# Patient Record
Sex: Female | Born: 1975 | Race: Black or African American | Hispanic: No | Marital: Married | Smoking: Never smoker
Health system: Southern US, Community
[De-identification: ages and names within clinical notes are randomized; demographics above are authoritative.]

## PROBLEM LIST (undated history)

## (undated) DIAGNOSIS — R87619 Unspecified abnormal cytological findings in specimens from cervix uteri: Secondary | ICD-10-CM

## (undated) DIAGNOSIS — N870 Mild cervical dysplasia: Secondary | ICD-10-CM

## (undated) DIAGNOSIS — T7840XA Allergy, unspecified, initial encounter: Secondary | ICD-10-CM

## (undated) DIAGNOSIS — IMO0002 Reserved for concepts with insufficient information to code with codable children: Secondary | ICD-10-CM

## (undated) DIAGNOSIS — M069 Rheumatoid arthritis, unspecified: Secondary | ICD-10-CM

## (undated) DIAGNOSIS — E039 Hypothyroidism, unspecified: Secondary | ICD-10-CM

## (undated) DIAGNOSIS — Z8701 Personal history of pneumonia (recurrent): Secondary | ICD-10-CM

## (undated) HISTORY — DX: Unspecified abnormal cytological findings in specimens from cervix uteri: R87.619

## (undated) HISTORY — DX: Allergy, unspecified, initial encounter: T78.40XA

## (undated) HISTORY — DX: Reserved for concepts with insufficient information to code with codable children: IMO0002

## (undated) HISTORY — PX: LEEP: SHX91

## (undated) HISTORY — DX: Mild cervical dysplasia: N87.0

## (undated) HISTORY — DX: Personal history of pneumonia (recurrent): Z87.01

## (undated) HISTORY — PX: TUBAL LIGATION: SHX77

## (undated) HISTORY — DX: Rheumatoid arthritis, unspecified: M06.9

---

## 1997-07-15 ENCOUNTER — Other Ambulatory Visit: Admission: RE | Admit: 1997-07-15 | Discharge: 1997-07-15 | Payer: Self-pay | Admitting: Family Medicine

## 2001-10-17 ENCOUNTER — Other Ambulatory Visit: Admission: RE | Admit: 2001-10-17 | Discharge: 2001-10-17 | Payer: Self-pay | Admitting: Obstetrics and Gynecology

## 2001-12-18 ENCOUNTER — Ambulatory Visit (HOSPITAL_COMMUNITY): Admission: RE | Admit: 2001-12-18 | Discharge: 2001-12-18 | Payer: Self-pay | Admitting: Obstetrics and Gynecology

## 2001-12-18 ENCOUNTER — Encounter (INDEPENDENT_AMBULATORY_CARE_PROVIDER_SITE_OTHER): Payer: Self-pay | Admitting: Specialist

## 2002-05-24 ENCOUNTER — Other Ambulatory Visit: Admission: RE | Admit: 2002-05-24 | Discharge: 2002-05-24 | Payer: Self-pay | Admitting: Obstetrics and Gynecology

## 2002-10-04 ENCOUNTER — Other Ambulatory Visit: Admission: RE | Admit: 2002-10-04 | Discharge: 2002-10-04 | Payer: Self-pay | Admitting: Obstetrics and Gynecology

## 2003-02-27 ENCOUNTER — Other Ambulatory Visit: Admission: RE | Admit: 2003-02-27 | Discharge: 2003-02-27 | Payer: Self-pay | Admitting: Obstetrics and Gynecology

## 2004-03-18 ENCOUNTER — Other Ambulatory Visit: Admission: RE | Admit: 2004-03-18 | Discharge: 2004-03-18 | Payer: Self-pay | Admitting: Obstetrics and Gynecology

## 2004-12-01 ENCOUNTER — Encounter: Admission: RE | Admit: 2004-12-01 | Discharge: 2004-12-01 | Payer: Self-pay | Admitting: Family Medicine

## 2005-04-19 ENCOUNTER — Other Ambulatory Visit: Admission: RE | Admit: 2005-04-19 | Discharge: 2005-04-19 | Payer: Self-pay | Admitting: Obstetrics and Gynecology

## 2006-05-02 ENCOUNTER — Encounter: Admission: RE | Admit: 2006-05-02 | Discharge: 2006-05-02 | Payer: Self-pay | Admitting: Obstetrics and Gynecology

## 2007-06-24 ENCOUNTER — Inpatient Hospital Stay (HOSPITAL_COMMUNITY): Admission: AD | Admit: 2007-06-24 | Discharge: 2007-06-24 | Payer: Self-pay | Admitting: Obstetrics and Gynecology

## 2007-07-04 ENCOUNTER — Inpatient Hospital Stay (HOSPITAL_COMMUNITY): Admission: RE | Admit: 2007-07-04 | Discharge: 2007-07-07 | Payer: Self-pay | Admitting: Obstetrics and Gynecology

## 2007-07-04 ENCOUNTER — Encounter (INDEPENDENT_AMBULATORY_CARE_PROVIDER_SITE_OTHER): Payer: Self-pay | Admitting: Obstetrics and Gynecology

## 2010-07-28 NOTE — H&P (Signed)
[name], [name]             ACCOUNT NO.:  [id number]   MEDICAL RECORD NO.:  [id number]          PATIENT TYPE:  INP   LOCATION:  9165                          FACILITY:  [facility]   PHYSICIAN:  Osborn Coho, M.D.   DATE OF BIRTH:  1975/12/17   DATE OF ADMISSION:  07/04/2007  DATE OF DISCHARGE:                              HISTORY & PHYSICAL   [name] is a 35 year old gravida 2, para 1-0-0-1, at 39-1/7 weeks,  who presents today for induction secondary to mildly elevated liver  function tests and LGA.  The patient's history is been remarkable for:   1. A LEEP procedure in 2005.  2. Negative group B strep.  3. Mildly elevated liver function tests beginning approximately June 21, 2007 with SGOT 46 at that time.  Over the next 2 weeks SGOT has      remained approximately between 38 and 45 with most recent      examination on April 20 of an SGOT of 38.  4. Possible LGA on last ultrasound.   PRENATAL LABS:  Blood type is A+, Rh antibody negative.  VDRL  nonreactive.  Rubella titer positive.  Hepatitis B surface antigen  negative.  HIV nonreactive.  Sickle cell test was negative.  Cystic  fibrosis testing was declined.  Varicella was immune.  GC and chlamydia  cultures were done in September and were negative.  Pap was done in  September 2008 and was normal.  The patient declined first trimester  screening and quadruple screening.  She had a normal Glucola.  Hemoglobin upon entering the practice was 11.3.  It was 10.1 at 27  weeks.  She had a normal Glucola and an RPR that was nonreactive at 27  weeks.  GC, chlamydia, group B strep were done at 35 weeks and all were  negative.  On April 8 the patient had a mild elevation of her blood  pressure of 130/88.  Comprehensive metabolic panel was done at that  time.  Uric acid, LDH were all normal.  However, the SGOT was 46.  She  had a 24-hour urine done subsequent to that that showed normal values  with 148 mg protein in a 24-hour  specimen.  Over the next week and  half  the patient's blood pressures and SGOT, SGPT and other labs were  monitored.  Her SGOT was noted to peak at previously-noted 46 on April  8.  On most recent examination on April 20 SGOT was 38, creatinine was  0.56, BUN was 8 and uric acid was 5.9.  LDH was 145.   HISTORY OF PRESENT PREGNANCY:  The patient entered care at approximately  10 weeks.  She was undecided regarding first trimester screen.  Ultimately she elected to decline that as well as quadruple screen.  Cervical length was done at 14 weeks secondary to history of LEEP  procedure.  Labs were all normal.  The patient a Pap done in September  in 58-month follow-up from a colposcopy.  She had another ultrasound at  19 weeks showing normal growth and normal cervical length.  There were  some limitations of anatomy.  These were evaluated at the following  visit.  She had a normal Glucola.  She did express some interest in  tubal; however, no definitive plans were made.  Hemoglobin at 27 weeks  was 10.1.  Iron samples were given.  She had some swelling in her legs  at 33 weeks.  TED hose were recommended.  Cultures and group B strep  were done at 35 weeks, which were all negative.  She did continue to  desire sterilization at a follow-up visit.  This was discussed.  At 36  weeks she had an estimated fetal weight of 6 pounds 12 ounces at the  76th percentile.  At 37 weeks she complained of some bumps.  There was  no history of HSV.  Blood pressure at that time was 130/88 and 140/88.  Comprehensive metabolic panel was done with [facility] labs.  That is when the  SGOT was noted to be slightly elevated at 46.  All other values were  normal.  A 24-hour urine was done that showed 148 mg of protein in a 24-  hour specimen.  LFTs were followed over the next week and half.  They  were stable and even little bit less; however, SGOT remained at 38 on  evaluation on April 20.  Other labs were normal.  The  decision was made  to go ahead and proceed with induction at this time in light of also  possible LGA per Dr. Su Hilt' assessment.   OBSTETRICAL HISTORY:  In 1998 she had a vaginal birth of a female  infant,weight 7 pounds 8 ounces,at 40 weeks.  She was in labor 8 hours.  She had epidural anesthesia.   MEDICAL HISTORY:  She was on Ortho-Cept and stopped in February 2008.  In April 2008 she had a colposcopy for an abnormal Pap.  This was  normal, and she had 70-month follow-up after that.  She had a LEEP  procedure in 2005.  She did receive the varicella vaccine because she  had never had a history of chickenpox; however, she had varicella titers  that were immune during this pregnancy.  The patient was hospitalized  for pneumonia in 1998.   She has no known medication allergies; however, she is sensitive to all  fruit, ragweed, dust and cats.   FAMILY HISTORY:  Her mother is hypertensive on medication.  Her sister  has some type of thyroid problem.  Her sister also has seizures.  Her  father has depression and was on medication.  Her sister also had  preeclampsia.   GENETIC HISTORY:  Unremarkable.   SOCIAL HISTORY:  The patient is married to the father of baby.  He is  involved and supportive.  His name is Tonnia Bardin.  The patient is  Tree surgeon.  The patient has a bachelor's degree.  She is a  Nurse, children's.  Her husband has a Advertising copywriter.  He is a Paediatric nurse.  The  patient denies any religious affiliation.  She has been followed by the  physician service at [facility].  She denies any alcohol, drug  or tobacco use during this pregnancy.   PHYSICAL EXAM:  VITAL SIGNS:  On last documented on our chart, within  normal limits.  HEENT:  Within normal limits.  LUNGS:  Bilateral breath sounds are clear.  HEART:  Regular rate and rhythm without murmur.  BREASTS:  Soft and nontender.  ABDOMEN:  Fundal height is approximately 39  cm.  Estimated fetal weight  was not noted on  the patient's prenatal record per Dr. Su Hilt'  evaluation.  PELVIC EXAM:  Also deferred today.  EXTREMITIES:  Deep tendon reflexes are 2+ without clonus.  There is 1+  edema noted.  Fetal heart rate has been in the 140s by Doppler in the office.   ASSESSMENT:  1. Intrauterine pregnancy at 39-1/7 weeks.  2. Mild elevation of SGOT during the last 2 weeks.  3. History of abnormal Papanicolaou smear in 2008.  4. Desires sterilization.  5. Possible large for gestational age.   PLAN:  1. Admit to birthing suite per consult with Dr. Osborn Coho as      attending physician.  2. Routine physician orders.  3. Dr. Su Hilt plans low-dose Pitocin with pain medication p.r.n.      Renaldo Reel Emilee Hero, C.N.M.      Osborn Coho, M.D.  Electronically Signed    VLL/MEDQ  D:  07/04/2007  T:  07/04/2007  Job:  [id number]

## 2010-07-28 NOTE — Discharge Summary (Signed)
[name], French             ACCOUNT NO.:  [id number]   MEDICAL RECORD NO.:  [id number]          PATIENT TYPE:  INP   LOCATION:  9104                          [facility]ILITY:  [facility]   PHYSICIAN:  Osborn Coho, M.D.   DATE OF BIRTH:  1975-09-30   DATE OF ADMISSION:  07/04/2007  DATE OF DISCHARGE:  07/07/2007                               DISCHARGE SUMMARY   DISCHARGING PHYSICIAN:  Naima A. Dillard, MD   ADMISSION DIAGNOSES:  1. Intrauterine pregnancy at 39-1/7th weeks.  2. Elevated AST.  3. Questionable large for gestational age.  4. Group B strep negative.   DISCHARGE DIAGNOSES:  1. Intrauterine pregnancy at 39-1/7th weeks.  2. Elevated AST.  3. Questionable large for gestational age.  4. Group B strep negative.  5. Failure to descend and status post cesarean delivery of a female      infant weighing 7 pounds 14 ounces, Apgars 9 and 9.   HOSPITAL PROCEDURES:  1. Electronic fetal monitoring.  2. Pitocin induction of labor.  3. Epidural.  4. Primary low-transverse cesarean section.   HOSPITAL COURSE:  The patient was admitted for induction of labor  secondary to elevation of AST and questionable LGA.  Initial AST on  April 9 was 46 and on admission was 41.  A 24-hour urine protein showed  148 mg of protein.  She was placed on low-dose Pitocin with the cervix  initially 2-3 cm.  She progressed to complete dilation by 4:30 p.m. and  pushed for almost 2 hours with no descent of the vertex and decision was  made to proceed with primary low-transverse cesarean section performed  by Dr. Su Hilt under epidural anesthesia.  Estimated blood loss was 1500  mL.  There were no complications.  She was taken to recovery and mother  baby unit where she received routine care.  On postop day #1, she was  doing well.  Hemoglobin was 7.3.  She had no syncope and her orthostatic  vital signs were within normal limits.  AST was 41, ALT was 19, uric  acid 5.5.  She was placed on iron supplements.   On postop day #2, she  was doing well, breast-feeding well, tolerating food, IV was out,  catheter was out, and she received routine care.  On postop day #3, the  repeat labs showed a hemoglobin 6.2, but the patient had no hemodynamic  changes with that and continued to decline transfusion.   PHYSICAL EXAMINATION:  VITAL SIGNS:  Stable.  CHEST:  Clear.  HEART:  Regular rate and rhythm.  ABDOMEN:  Soft and appropriately tender.  Incision was clean, dry, and  intact.  EXTREMITIES:  Showed trace edema.   She was deemed to receive full benefit of her hospital stay and was  discharged home.   DISCHARGE LABORATORY DATA:  White blood cell count 9.6, hemoglobin 6.2,  platelets 149.  Potassium 3.7, creatinine 0.62.  AST 47, ALT 21.   DISCHARGE MEDICATIONS:  1. Motrin 600 mg p.o. q.6 h. P.r.n.  2. Tylox 1-2 p.o. q.4 h. P.r.n.  3. Hemocyte-F 1 p.o. daily   DISCHARGE INSTRUCTIONS:  Per [employer].   DISCHARGE FOLLOWUP:  In 6 weeks or p.r.n.   CONDITION ON DISCHARGE:  Good.      Marie L. Williams, C.N.M.      Osborn Coho, M.D.  Electronically Signed    MLW/MEDQ  D:  07/07/2007  T:  07/08/2007  Job:  [id number]

## 2010-07-28 NOTE — Op Note (Signed)
[name], [name]             ACCOUNT NO.:  [id number]   MEDICAL RECORD NO.:  [id number]          PATIENT TYPE:  INP   LOCATION:  9104                          [facility]ACILITY[facility]  [facility]   PHYSICIAN:  Osborn Coho, M.D.   DATE OF BIRTH:  08/12/1975   DATE OF PROCEDURE:  07/04/2007  DATE OF DISCHARGE:                               OPERATIVE REPORT   PREOPERATIVE DIAGNOSES:  1. Term intrauterine pregnancy.  2. Favorable cervix with augmentation.  3. Elevated liver function tests.  4. Failure to descend.   POSTOPERATIVE DIAGNOSES:  1. Term intrauterine pregnancy.  2. Favorable cervix with augmentation.  3. Elevated liver function tests.  4. Failure to descend.   PROCEDURE:  Primary low transverse C-section.   ANESTHESIA:  Epidural.   ATTENDING:  Dr. Osborn Coho.   ASSISTANT:  Cam Hai, C.N.M.   FLUIDS:  4000 mL.   ESTIMATED BLOOD LOSS:  1500 mL.   URINE OUTPUT:  300 mL.   SPECIMENS TO PATHOLOGY:  Placenta.   COMPLICATIONS:  None.   FINDINGS:  Live female infant with Apgars of 9 at one minute and 9 at five  minutes [name].  Weight 7 pounds 14 ounces.  Normal-appearing  bilateral ovaries and fallopian tubes.   DESCRIPTION OF PROCEDURE:  The patient was taken to the operating room  after the risks, benefits, alternatives discussed with the patient.  The  patient verbalized understanding and consent signed and witnessed.  The  patient was given a surgical level via the epidural and prepped and  draped in the normal sterile fashion in the supine position.  A  Pfannenstiel skin incision was made and carried down to the underlying  layer of fascia with the scalpel and Bovie.  The fascia was excised  bilaterally in the midline with the Bovie and extended bilaterally with  Mayo scissors.  Kocher clamps were placed on the inferior aspect of the  fascial incision and the rectus muscle excised from the fascia.  The  same was done on the superior aspect of the fascial  incision.  The  muscle was separated in midline and the peritoneum entered bluntly and  extended manually.  The bladder blade was placed and bladder flap  created with the Metzenbaum scissors.  The uterine incision was made  with a scalpel and extended bilaterally with the bandage scissors.  The  infant was delivered without difficulty and clear fluid was noted.  The  oropharynx was bulb suctioned and the cord clamped and cut, and the  infant handed to awaiting pediatricians.  The placenta was removed via  fundal massage and was sent to pathology.  The uterus was cleared of all  clots and debris.  The uterine incision was repaired with 0 Vicryl in a  running locked fashion and a second imbricating layer was performed.  Several figure-of-eight stitches were placed for hemostasis as well.  The intra-abdominal cavity was copiously irrigated and normal-appearing  bilateral ovaries and fallopian tubes were noted.  The peritoneum was  repaired with 2-0 chromic in a running fashion and the fascia was  repaired with -0 Vicryl in a  running fashion.  The subcutaneous tissue  was irrigated and made hemostatic with the Bovie.  The subcutaneous  tissue was reapproximated using 2-0 plain via four interrupted stitches.  The skin was reapproximated using 3-0 Monocryl via a subcuticular  stitch.  Half-inch Steri-Strips were applied with Benzoin.  Sponge, lap  and needle count was correct.  The patient tolerated the procedure well  and is awaiting transfer to the recovery room in good condition.      Osborn Coho, M.D.  Electronically Signed     AR/MEDQ  D:  07/04/2007  T:  07/04/2007  Job:  [id number]

## 2010-07-31 NOTE — H&P (Signed)
   NAME:  [name], [name]                         ACCOUNT NO.:  [id number]   MEDICAL RECORD NO.:  [id number]                   PATIENT TYPE:  AMB   LOCATION:  SDC                                  [facility]ILITY:  [facility]   PHYSICIAN:  Osborn Coho, M.D.                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   PAST OBSTETRICAL HISTORY:  Normal spontaneous vaginal delivery x1.  History  of regular menses.  The patient denies history of any STDs, gonorrhea,  Chlamydia, pelvic inflammatory disease, fibroids, or cysts.   PAST MEDICAL HISTORY:  Denies.   PAST SURGICAL HISTORY:  Denies.   MEDICATIONS:  Estrostep (OCP).   ALLERGIES:  No known drug allergies.  (Fruits and cats.)   SOCIAL HISTORY:  Denies smoking, alcohol, or illicit drug use.   FAMILY HISTORY:  History of asthma, cancer, hypertension, seizures, and  mental/emotional disorders.   PHYSICAL EXAMINATION:  VITAL SIGNS:  Stable.  GENERAL:  The patient is a well-appearing, healthy female.  LUNGS:  Clear to auscultation bilaterally.  HEART:  Regular rate and rhythm.  ABDOMEN:  Soft and nontender.  PELVIC:  As per office notes.  EXTREMITIES:  Within normal limits.   ASSESSMENT:  The patient is a 35 year old gravida 1, para 1, with high grade  SIL, HGSIL/CIN2.   PLAN:  LEEP.                                               Osborn Coho, M.D.    AR/MEDQ  D:  12/18/2001  T:  12/18/2001  Job:  [id number]

## 2010-07-31 NOTE — Op Note (Signed)
   NAME:  [name], [name]                         ACCOUNT NO.:  [id number]   MEDICAL RECORD NO.:  [id number]                   PATIENT TYPE:  AMB   LOCATION:  SDC                                  [facility]   PHYSICIAN:  Osborn Coho, M.D.                DATE OF BIRTH:  09/04/1975   DATE OF PROCEDURE:  12/18/2001  DATE OF DISCHARGE:                                 OPERATIVE REPORT   PREOPERATIVE DIAGNOSES:  CIN II.   POSTOPERATIVE DIAGNOSES:  CIN II.   PROCEDURE:  Loop electrosurgical excision procedure.   SURGEON:  Osborn Coho, M.D.   ANESTHESIA:  MAC and paracervical block using 10 cc of 2% lidocaine.   ESTIMATED BLOOD LOSS:  Minimal, less than 10 cc.   FLUIDS:  700 cc.   COMPLICATIONS:  None.   PROCEDURE:  The patient was taken to the operating room after the risks,  benefits, and alternatives of the procedure were discussed with patient.  The patient verbalized understanding and consent signed and witnessed.  The  patient was placed under MAC anesthesia and placed in a dorsal lithotomy  position and prepped and draped in a normal sterile fashion.  A bivalve  speculum was placed in the patient's vagina and a paracervical block  delivered using 5 cc of 2% lidocaine at 3 o'clock and 9 o'clock on the  cervix, respectively.  A LEEP electrocautery device was then used to perform  the LEEP and specimen tagged and sent off to pathology.  A ball attachment  was then used for coagulation and Monsel solution applied with good  hemostasis.  The speculum was removed.  The patient tolerated procedure well  and was returned to the recovery room in stable condition.                                               Osborn Coho, M.D.    AR/MEDQ  D:  12/18/2001  T:  12/18/2001  Job:  [id number]

## 2010-12-08 LAB — URINE MICROSCOPIC-ADD ON

## 2010-12-08 LAB — COMPREHENSIVE METABOLIC PANEL
ALT: 21
AST: 44 — ABNORMAL HIGH
AST: 47 — ABNORMAL HIGH
Albumin: 2.6 — ABNORMAL LOW
Albumin: 1.7 — ABNORMAL LOW
Alkaline Phosphatase: 107
Alkaline Phosphatase: 92
BUN: 12
CO2: 25
CO2: 25
Calcium: 9.4
Calcium: 8.1 — ABNORMAL LOW
Chloride: 106
Chloride: 107
Chloride: 110
Creatinine, Ser: 0.46
Creatinine, Ser: 0.62
Creatinine, Ser: 0.74
GFR calc Af Amer: >60
GFR calc Af Amer: >60
GFR calc non Af Amer: >60
GFR calc non Af Amer: >60
Glucose, Bld: 123 — ABNORMAL HIGH
Potassium: 3.7
Potassium: 3.9
Sodium: 138
Total Bilirubin: 0.8
Total Bilirubin: 0.3
Total Protein: 6.1
Total Protein: 4 — ABNORMAL LOW

## 2010-12-08 LAB — CBC
HCT: 33.8 — ABNORMAL LOW
Hemoglobin: 11.6 — ABNORMAL LOW
Hemoglobin: 11.9 — ABNORMAL LOW
Hemoglobin: 6.2 — CL
MCHC: 34.1
MCV: 88.9
MCV: 89.8
Platelets: 149 — ABNORMAL LOW
RBC: 3.8 — ABNORMAL LOW
RBC: 2.02 — ABNORMAL LOW
RBC: 2.42 — ABNORMAL LOW
RDW: 15.3
RDW: 15.4
WBC: 7.6
WBC: 9.6

## 2010-12-08 LAB — URINALYSIS, ROUTINE W REFLEX MICROSCOPIC
Hgb urine dipstick: NEGATIVE
Protein, ur: NEGATIVE
Specific Gravity, Urine: <1.005 — ABNORMAL LOW
Urobilinogen, UA: 0.2
pH: 7

## 2010-12-08 LAB — DIFFERENTIAL
Basophils Absolute: 0
Eosinophils Relative: 0
Lymphocytes Relative: 17
Lymphs Abs: 2
Monocytes Absolute: 0.7
Neutro Abs: 8.8 — ABNORMAL HIGH
Neutrophils Relative %: 76

## 2010-12-08 LAB — URIC ACID: Uric Acid, Serum: 5.5

## 2010-12-22 ENCOUNTER — Other Ambulatory Visit: Payer: Self-pay | Admitting: Obstetrics and Gynecology

## 2011-01-01 ENCOUNTER — Other Ambulatory Visit: Payer: Self-pay | Admitting: Obstetrics and Gynecology

## 2011-01-06 ENCOUNTER — Other Ambulatory Visit: Payer: Self-pay | Admitting: Obstetrics and Gynecology

## 2011-01-20 ENCOUNTER — Encounter (HOSPITAL_COMMUNITY): Payer: Self-pay

## 2011-01-21 NOTE — Patient Instructions (Addendum)
   Your procedure is scheduled [id number] November 19th  Enter through the Main Entrance of [facility] at:8am Pick up the phone at the desk and dial [phone] and inform us of your arrival.  Please call this number if you have any problems the morning of surgery: [phone]  Remember: Do not eat food after midnight:Sunday Do not drink clear liquids after:midnight Sunday Take these medicines the morning of surgery with a SIP OF WATER: thyroid med  Do not wear jewelry, make-up, or FINGER nail polish Do not wear lotions, powders, or perfumes.  You may not  wear deodorant. Do not shave 48 hours prior to surgery. Do not bring valuables to the hospital.    Patients discharged on the day of surgery will not be allowed to drive home.   Name and phone number of your driver:   Remember to use your hibiclens as instructed.Please shower with 1/2 bottle the evening before your surgery and the other 1/2 bottle the morning of surgery.

## 2011-01-25 ENCOUNTER — Encounter (HOSPITAL_COMMUNITY)
Admission: RE | Admit: 2011-01-25 | Discharge: 2011-01-25 | Disposition: A | Discharge status: Home / Self Care | Payer: 59 | Source: Ambulatory Visit | Attending: Obstetrics and Gynecology | Admitting: Obstetrics and Gynecology

## 2011-01-25 ENCOUNTER — Encounter (HOSPITAL_COMMUNITY): Payer: Self-pay

## 2011-01-25 HISTORY — DX: Hypothyroidism, unspecified: E03.9

## 2011-01-25 LAB — CBC
HCT: 35.4 % — ABNORMAL LOW (ref 36.0–46.0)
Hemoglobin: 11.3 g/dL — ABNORMAL LOW (ref 12.0–15.0)
MCH: 27.2 pg (ref 26.0–34.0)
MCHC: 31.9 g/dL (ref 30.0–36.0)
MCV: 85.3 fL (ref 78.0–100.0)
RBC: 4.15 MIL/uL (ref 3.87–5.11)

## 2011-01-25 LAB — SURGICAL PCR SCREEN: MRSA, PCR: NEGATIVE

## 2011-01-25 NOTE — Pre-Procedure Instructions (Signed)
Pt doesn't want endometrial ablation. Adrianne in Dr. Su Hilt' office notified and said it is ok to omit "endometrial ablation" from consent.

## 2011-01-25 NOTE — Pre-Procedure Instructions (Signed)
I notified Kendalle in OR that pt refuses endometrial ablation and that Adrianne in Dr. Su Hilt' office approved omitting that procedure from consent form.

## 2011-02-01 ENCOUNTER — Encounter (HOSPITAL_COMMUNITY): Payer: Self-pay | Admitting: *Deleted

## 2011-02-01 ENCOUNTER — Encounter (HOSPITAL_COMMUNITY): Payer: Self-pay | Admitting: Anesthesiology

## 2011-02-01 ENCOUNTER — Other Ambulatory Visit: Payer: Self-pay | Admitting: Obstetrics and Gynecology

## 2011-02-01 ENCOUNTER — Encounter (HOSPITAL_COMMUNITY)
Admission: RE | Disposition: A | Discharge status: Home / Self Care | Payer: Self-pay | Source: Ambulatory Visit | Attending: Obstetrics and Gynecology

## 2011-02-01 ENCOUNTER — Ambulatory Visit (HOSPITAL_COMMUNITY): Payer: 59 | Admitting: Anesthesiology

## 2011-02-01 ENCOUNTER — Ambulatory Visit (HOSPITAL_COMMUNITY)
Admission: RE | Admit: 2011-02-01 | Discharge: 2011-02-01 | Disposition: A | Discharge status: Home / Self Care | Payer: 59 | Source: Ambulatory Visit | Attending: Obstetrics and Gynecology | Admitting: Obstetrics and Gynecology

## 2011-02-01 DIAGNOSIS — N920 Excessive and frequent menstruation with regular cycle: Secondary | ICD-10-CM | POA: Insufficient documentation

## 2011-02-01 DIAGNOSIS — Z01818 Encounter for other preprocedural examination: Secondary | ICD-10-CM | POA: Insufficient documentation

## 2011-02-01 DIAGNOSIS — Z302 Encounter for sterilization: Secondary | ICD-10-CM | POA: Insufficient documentation

## 2011-02-01 DIAGNOSIS — Z01812 Encounter for preprocedural laboratory examination: Secondary | ICD-10-CM | POA: Insufficient documentation

## 2011-02-01 HISTORY — PX: LAPAROSCOPIC TUBAL LIGATION: SHX1937

## 2011-02-01 LAB — HCG, SERUM, QUALITATIVE: Preg, Serum: NEGATIVE

## 2011-02-01 SURGERY — LIGATION, FALLOPIAN TUBE, LAPAROSCOPIC
Anesthesia: General

## 2011-02-01 MED ORDER — HYDROCODONE-ACETAMINOPHEN 5-500 MG PO TABS: 1.0000 | ORAL_TABLET | Freq: Four times a day (QID) | ORAL | Status: AC | PRN

## 2011-02-01 MED ORDER — LIDOCAINE HCL (CARDIAC) 20 MG/ML IV SOLN
INTRAVENOUS | Status: AC
  Filled 2011-02-01: qty 5

## 2011-02-01 MED ORDER — MIDAZOLAM HCL 2 MG/2ML IJ SOLN
INTRAMUSCULAR | Status: AC
  Filled 2011-02-01: qty 2

## 2011-02-01 MED ORDER — HYDROCODONE-ACETAMINOPHEN 5-325 MG PO TABS
1.0000 | ORAL_TABLET | ORAL | Status: AC
  Administered 2011-02-01: 1 via ORAL

## 2011-02-01 MED ORDER — IBUPROFEN 600 MG PO TABS: 600.0000 mg | ORAL_TABLET | Freq: Four times a day (QID) | ORAL | Status: AC | PRN

## 2011-02-01 MED ORDER — HYDROCODONE-ACETAMINOPHEN 5-325 MG PO TABS
ORAL_TABLET | ORAL | Status: AC
  Administered 2011-02-01: 1 via ORAL
  Filled 2011-02-01: qty 1

## 2011-02-01 MED ORDER — ASPIRIN-ACETAMINOPHEN-CAFFEINE 250-250-65 MG PO TABS: 2.0000 | ORAL_TABLET | Freq: Four times a day (QID) | ORAL | Status: DC | PRN

## 2011-02-01 MED ORDER — MIDAZOLAM HCL 5 MG/5ML IJ SOLN
INTRAMUSCULAR | Status: DC | PRN
  Administered 2011-02-01: 2 mg via INTRAVENOUS

## 2011-02-01 MED ORDER — ONDANSETRON HCL 4 MG/2ML IJ SOLN
INTRAMUSCULAR | Status: DC | PRN
  Administered 2011-02-01: 4 mg via INTRAVENOUS

## 2011-02-01 MED ORDER — NEOSTIGMINE METHYLSULFATE 1 MG/ML IJ SOLN
INTRAMUSCULAR | Status: AC
  Filled 2011-02-01: qty 10

## 2011-02-01 MED ORDER — NEOSTIGMINE METHYLSULFATE 1 MG/ML IJ SOLN
INTRAMUSCULAR | Status: DC | PRN
  Administered 2011-02-01: 4 mg via INTRAVENOUS

## 2011-02-01 MED ORDER — LACTATED RINGERS IV SOLN
INTRAVENOUS | Status: DC
  Administered 2011-02-01 (×2): via INTRAVENOUS

## 2011-02-01 MED ORDER — FENTANYL CITRATE 0.05 MG/ML IJ SOLN
INTRAMUSCULAR | Status: AC
  Filled 2011-02-01: qty 5

## 2011-02-01 MED ORDER — ROCURONIUM BROMIDE 50 MG/5ML IV SOLN
INTRAVENOUS | Status: AC
  Filled 2011-02-01: qty 1

## 2011-02-01 MED ORDER — LIDOCAINE HCL 1 % IJ SOLN
INTRAMUSCULAR | Status: DC | PRN
  Administered 2011-02-01: 10 mL

## 2011-02-01 MED ORDER — FENTANYL CITRATE 0.05 MG/ML IJ SOLN
INTRAMUSCULAR | Status: DC | PRN
  Administered 2011-02-01: 50 ug via INTRAVENOUS
  Administered 2011-02-01: 100 ug via INTRAVENOUS
  Administered 2011-02-01 (×2): 50 ug via INTRAVENOUS

## 2011-02-01 MED ORDER — BUPIVACAINE HCL (PF) 0.25 % IJ SOLN
INTRAMUSCULAR | Status: DC | PRN
  Administered 2011-02-01: 10 mL

## 2011-02-01 MED ORDER — PROPOFOL 10 MG/ML IV EMUL
INTRAVENOUS | Status: AC
  Filled 2011-02-01: qty 20

## 2011-02-01 MED ORDER — ONDANSETRON HCL 4 MG/2ML IJ SOLN
INTRAMUSCULAR | Status: AC
  Filled 2011-02-01: qty 2

## 2011-02-01 MED ORDER — GLYCOPYRROLATE 0.2 MG/ML IJ SOLN
INTRAMUSCULAR | Status: AC
  Filled 2011-02-01: qty 1

## 2011-02-01 MED ORDER — GLYCOPYRROLATE 0.2 MG/ML IJ SOLN
INTRAMUSCULAR | Status: DC | PRN
  Administered 2011-02-01: .8 mg via INTRAVENOUS

## 2011-02-01 MED ORDER — PROPOFOL 10 MG/ML IV EMUL
INTRAVENOUS | Status: DC | PRN
  Administered 2011-02-01: 150 mg via INTRAVENOUS

## 2011-02-01 MED ORDER — ROCURONIUM BROMIDE 100 MG/10ML IV SOLN
INTRAVENOUS | Status: DC | PRN
  Administered 2011-02-01: 40 mg via INTRAVENOUS

## 2011-02-01 MED ORDER — DEXAMETHASONE SODIUM PHOSPHATE 4 MG/ML IJ SOLN
INTRAMUSCULAR | Status: DC | PRN
  Administered 2011-02-01: 10 mg via INTRAVENOUS

## 2011-02-01 MED ORDER — KETOROLAC TROMETHAMINE 30 MG/ML IJ SOLN
INTRAMUSCULAR | Status: DC | PRN
  Administered 2011-02-01: 30 mg via INTRAVENOUS

## 2011-02-01 SURGICAL SUPPLY — 26 items
CANISTER SUCTION 2500CC (MISCELLANEOUS) ×3 IMPLANT
CATH ROBINSON RED A/P 16FR (CATHETERS) ×3 IMPLANT
CHLORAPREP W/TINT 26ML (MISCELLANEOUS) ×3 IMPLANT
CLOTH BEACON ORANGE TIMEOUT ST (SAFETY) ×3 IMPLANT
CONTAINER PREFILL 10% NBF 60ML (FORM) ×6 IMPLANT
DRAPE UTILITY XL STRL (DRAPES) ×6 IMPLANT
DRSG COVADERM PLUS 2X2 (GAUZE/BANDAGES/DRESSINGS) ×1 IMPLANT
ELECT REM PT RETURN 9FT ADLT (ELECTROSURGICAL) ×3
ELECTRODE REM PT RTRN 9FT ADLT (ELECTROSURGICAL) ×2 IMPLANT
GLOVE BIO SURGEON STRL SZ7.5 (GLOVE) ×6 IMPLANT
GLOVE BIOGEL PI IND STRL 7.5 (GLOVE) ×2 IMPLANT
GLOVE BIOGEL PI INDICATOR 7.5 (GLOVE) ×1
GOWN PREVENTION PLUS LG XLONG (DISPOSABLE) ×3 IMPLANT
LOOP ANGLED CUTTING 22FR (CUTTING LOOP) IMPLANT
NDL HYPO 25X1 1.5 SAFETY (NEEDLE) ×2 IMPLANT
NDL INSUFFLATION 14GA 120MM (NEEDLE) ×2 IMPLANT
NEEDLE HYPO 25X1 1.5 SAFETY (NEEDLE) ×3 IMPLANT
NEEDLE INSUFFLATION 14GA 120MM (NEEDLE) ×3 IMPLANT
PACK HYSTEROSCOPY LF (CUSTOM PROCEDURE TRAY) ×3 IMPLANT
PACK LAPAROSCOPY BASIN (CUSTOM PROCEDURE TRAY) ×3 IMPLANT
SUT MON AB 4-0 PS1 27 (SUTURE) ×3 IMPLANT
SUT VICRYL 0 UR6 27IN ABS (SUTURE) ×3 IMPLANT
TOWEL OR 17X24 6PK STRL BLUE (TOWEL DISPOSABLE) ×6 IMPLANT
TROCAR Z-THREAD FIOS 11X100 BL (TROCAR) ×3 IMPLANT
WARMER LAPAROSCOPE (MISCELLANEOUS) ×3 IMPLANT
WATER STERILE IRR 1000ML POUR (IV SOLUTION) ×3 IMPLANT

## 2011-02-01 NOTE — Anesthesia Postprocedure Evaluation (Signed)
Anesthesia Post Note  Patient: [name]  Procedure(s) Performed:  LAPAROSCOPIC TUBAL LIGATION; DILATATION & CURETTAGE/HYSTEROSCOPY WITH RESECTOSCOPE - w/resectoscope  Anesthesia type: General  Patient location: PACU  Post pain: Pain level controlled  Post assessment: Post-op Vital signs reviewed  Last Vitals:  Filed Vitals:   02/01/11 1215  BP: 110/66  Pulse: 61  Temp: 36.4 C  Resp: 16    Post vital signs: Reviewed  Level of consciousness: sedated  Complications: No apparent anesthesia complications

## 2011-02-01 NOTE — H&P (Addendum)
02/01/11 No change in H&P - AYR

## 2011-02-01 NOTE — Op Note (Addendum)
Preop Diagnosis: Sterilization   Postop Diagnosis: Sterilization   Procedure: 1. LAPAROSCOPIC TUBAL LIGATION 2.HYSTEROSCOPY 3.D&C  Anesthesia: General   Anesthesiologist: Amy L. Rodman Pickle, MD   Attending: Purcell Nails, MD   Assistant: N/A  Findings: Normal appearing bilateral ovaries and fallopian tubes with small approx 1-2cm simple cyst (possible corpus luteum) on left ovary.  Uterus sounded to 8 1/2 cms.  No obvious intracavitary lesions were visualized.  Pathology: Endometrial Currettings  Fluids: 2000cc Hysteroscopic Fluid Deficit 25cc  UOP: QS via straight cath prior to procedure  EBL: Minimal  Complications: None  Procedure: The patient was taken to the operating room after the risks, benefits, alternatives, complications, treatment options, and expected outcomes were discussed with the patient. The patient verbalized understanding, the patient concurred with the proposed plan and consent signed and witnessed. The patient was taken to the Operating Room, identified as [name] and the procedure verified as laparoscopic bilateral tubal fulguration and hysteroscopy, D&C. A Time Out was held and the above information confirmed.  The patient was placed under general anesthesia per anesthesia staff, the patient was placed in modified dorsal lithotomy position and was prepped, draped, and catheterized in the normal, sterile fashion.  The cervix was visualized and an intrauterine manipulator was placed. A  10 mm umbilical incision was then performed. Veress needle was passed and pneumoperitoneum was established. A 10 mm trocar was advanced into the intraabdominal cavity, the operative laparoscope was introduced and findings as noted above.  The right fallopian tube was carried out to its fimbriated end and in the isthmic portion cauterized for at least 3 consecutive burns.  The same was done on the contralateral side.    Following the procedure the umbilical sheath  was removed after intra-abdominal carbon dioxide was expressed. The fascia was repaired with 0 vicryl via a figure of eight stitch and the incision was closed with 3-0 monocryl via subcuticular sutures. The intrauterine manipulator was then removed.  Attention was then turned to the perineum and a bivalve speculum was placed in the patient's vagina and the anterior lip of the cervix was grasped with a single tooth tenaculum. A paracervical block was administered using a total of 10 cc of 1% lidocaine. The uterus sounded to 8 1/2 cm. The cervix was dilated for passage of the hysteroscope.  The hysteroscope was introduced into the uterine cavity and findings as noted above. Sharp curettage was performed until a gritty texture was noted and currettings sent to pathology. The hysteroscope was reintroduced and no obvious remaining intracavitary lesions were noted.  All instruments were removed. Sponge lap and needle count was correct. The patient tolerated the procedure well and was returned to the recovery room in good condition.  Instrument, sponge, and needle counts were correct.  The patient tolerated the procedure well and was returned to the recovery room in good condition.

## 2011-02-01 NOTE — Anesthesia Procedure Notes (Signed)
Procedures

## 2011-02-01 NOTE — Anesthesia Preprocedure Evaluation (Signed)
Anesthesia Evaluation  Patient identified by MRN, date of birth, ID band Patient awake    Reviewed: Allergy & Precautions, H&P , NPO status , Patient's Chart, lab work & pertinent test results, reviewed documented beta blocker date and time   History of Anesthesia Complications Negative for: history of anesthetic complications  Airway Mallampati: II TM Distance: >3 FB Neck ROM: full    Dental  (+) Teeth Intact   Pulmonary neg pulmonary ROS,  clear to auscultation  Pulmonary exam normal       Cardiovascular Exercise Tolerance: Good neg cardio ROS regular Normal    Neuro/Psych Negative Neurological ROS  Negative Psych ROS   GI/Hepatic negative GI ROS, Neg liver ROS,   Endo/Other  Hypothyroidism (did not take med today)   Renal/GU negative Renal ROS  Genitourinary negative   Musculoskeletal   Abdominal   Peds  Hematology negative hematology ROS (+)   Anesthesia Other Findings   Reproductive/Obstetrics negative OB ROS                           Anesthesia Physical Anesthesia Plan  ASA: I  Anesthesia Plan: General   Post-op Pain Management:    Induction:   Airway Management Planned: Oral ETT  Additional Equipment:   Intra-op Plan:   Post-operative Plan:   Informed Consent: I have reviewed the patients History and Physical, chart, labs and discussed the procedure including the risks, benefits and alternatives for the proposed anesthesia with the patient or authorized representative who has indicated his/her understanding and acceptance.   Dental Advisory Given  Plan Discussed with: CRNA and Surgeon  Anesthesia Plan Comments:         Anesthesia Quick Evaluation

## 2011-02-01 NOTE — Transfer of Care (Signed)
Immediate Anesthesia Transfer of Care Note  Patient: [name]  Procedure(s) Performed:  LAPAROSCOPIC TUBAL LIGATION; DILATATION & CURETTAGE/HYSTEROSCOPY WITH RESECTOSCOPE - w/resectoscope  Patient Location: PACU  Anesthesia Type: General  Level of Consciousness: awake  Airway & Oxygen Therapy: Patient Spontanous Breathing on 2 liters nasal cannula  Post-op Assessment: Report given to PACU RN  Post vital signs: Reviewed and stable  Complications: No apparent anesthesia complications

## 2011-02-01 NOTE — H&P (Signed)
[name] is an 35 y.o. female G2P2 with c/o menometrorrhagia and desires permanent sterilization.  Pertinent Gynecological History: Menses: excessive Bleeding: heavy and irregular Contraception: desires permanent sterilization DES exposure: unknown Sexually transmitted diseases: no past history, pos HSV 1  Last mammogram: n/a Date: n/a Last pap: abnormal: LGSIL Date: 01/12/11 OB History: G2, P2   Menstrual History: Patient's last menstrual period was 01/28/2011.    Past Medical History  Diagnosis Date  . Hypothyroidism     Past Surgical History  Procedure Date  . Cesarean section     History reviewed. No pertinent family history.  Social History:  reports that she has never smoked. She does not have any smokeless tobacco history on file. She reports that she does not drink alcohol or use illicit drugs.  Allergies: No Known Allergies  Prescriptions prior to admission  Medication Sig Dispense Refill  . aspirin-acetaminophen-caffeine (EXCEDRIN MIGRAINE) 250-250-65 MG per tablet Take 2 tablets by mouth every 6 (six) hours as needed. Patient is using this medication for headache.       . diphenhydrAMINE (BENADRYL) 25 mg capsule Take 25 mg by mouth every 6 (six) hours as needed. Patient is using this medication for allergies.       Marland Kitchen ibuprofen (ADVIL,MOTRIN) 200 MG tablet Take 400 mg by mouth every 6 (six) hours as needed. Patient is using this medication for pain.       Marland Kitchen levothyroxine (SYNTHROID, LEVOTHROID) 50 MCG tablet Take 50 mcg by mouth daily.        . Pseudoeph-Doxylamine-DM-APAP (NYQUIL) 60-7.08-11-998 MG/30ML LIQD Take 5 mLs by mouth daily as needed. Patient is using this medication for cold symptoms.       . Pseudoephedrine-APAP-DM (DAYQUIL MULTI-SYMPTOM) 60-650-20 MG/30ML LIQD Take 5 mLs by mouth daily as needed. Patient is using this medication for cold symptoms.       . Pseudoephedrine-APAP-DM (TYLENOL COLD NO DROWSINESS) 30-325-15 MG TABS Take 1 tablet by  mouth daily as needed. For cold          Review of Systems  Constitutional: Negative.   HENT: Negative.   Eyes: Negative.   Respiratory: Negative.   Cardiovascular: Negative.   Gastrointestinal: Negative.   Genitourinary: Negative.   Musculoskeletal: Negative.   Skin: Negative.   Neurological: Negative.   Endo/Heme/Allergies: Negative.   Psychiatric/Behavioral: Negative.     Blood pressure 112/80, pulse 71, temperature 98.2 F (36.8 C), temperature source Oral, resp. rate 16, height 5\' 1"  (1.549 m), weight 55.792 kg (123 lb), last menstrual period 01/28/2011, SpO2 100.00%. Physical Exam  Constitutional: She appears well-developed and well-nourished.  HENT:  Head: Normocephalic.  Neck: Neck supple.  Cardiovascular: Normal rate and regular rhythm.   Respiratory: Effort normal.  GI: Soft.  Musculoskeletal: Normal range of motion.  Neurological: She is alert.  Skin: Skin is warm.    Results for orders placed during the hospital encounter of 02/01/11 (from the past 24 hour(s))  HCG, SERUM, QUALITATIVE     Status: Normal   Collection Time   02/01/11  8:06 AM      Component Value Range   Preg, Serum NEGATIVE  NEGATIVE     No results found.  Assessment/Plan: 35yo G2P@ with menometrorrhagia found to have a polyp and also desires permanent sterilization.  The risks, benefits and alternatives have been discussed with the patient and consent signed and witnessed.  The patient is scheduled to undergo Laparoscopic bilateral tubal fulguration and Hysteroscopy D&C.  Questions have been answered.  Purcell Nails  02/01/2011, 10:03 AM

## 2011-02-02 ENCOUNTER — Encounter (HOSPITAL_COMMUNITY): Payer: Self-pay | Admitting: Obstetrics and Gynecology

## 2011-07-01 ENCOUNTER — Telehealth: Payer: Self-pay | Admitting: Obstetrics and Gynecology

## 2011-07-01 NOTE — Telephone Encounter (Signed)
Routed to jackie  

## 2011-07-05 ENCOUNTER — Telehealth: Payer: Self-pay

## 2011-07-05 NOTE — Telephone Encounter (Signed)
LM for pt to call me back re: meds request. Levin Erp

## 2011-11-30 ENCOUNTER — Ambulatory Visit (INDEPENDENT_AMBULATORY_CARE_PROVIDER_SITE_OTHER): Payer: 59 | Admitting: Obstetrics and Gynecology

## 2011-11-30 ENCOUNTER — Encounter: Payer: Self-pay | Admitting: Obstetrics and Gynecology

## 2011-11-30 VITALS — BP 90/62 | Ht 61.0 in | Wt 123.0 lb

## 2011-11-30 DIAGNOSIS — N946 Dysmenorrhea, unspecified: Secondary | ICD-10-CM

## 2011-11-30 DIAGNOSIS — R300 Dysuria: Secondary | ICD-10-CM

## 2011-11-30 LAB — POCT URINALYSIS DIPSTICK
Blood, UA: NEGATIVE
Glucose, UA: NEGATIVE
Spec Grav, UA: <=1.005
Urobilinogen, UA: NEGATIVE
pH, UA: 6

## 2011-11-30 NOTE — Progress Notes (Signed)
C/o pain with menses last cycle but none this cycle.  Pain was controlled with motrin. Filed Vitals:   11/30/11 1647  BP: 90/62   ROS: noncontributory  Pelvic exam:  VULVA: normal appearing vulva with no masses, tenderness or lesions,  VAGINA: normal appearing vagina with normal color and discharge, no lesions, CERVIX: normal appearing cervix without discharge or lesions,  UTERUS: uterus is normal size, shape, consistency and nontender,  ADNEXA: normal adnexa in size, nontender and no masses.  Results for orders placed in visit on 11/30/11  POCT URINALYSIS DIPSTICK      Component Value Range   Color, UA       Clarity, UA       Glucose, UA neg     Bilirubin, UA neg     Ketones, UA neg     Spec Grav, UA <=1.005     Blood, UA neg     pH, UA 6.0     Protein, UA neg     Urobilinogen, UA negative     Nitrite, UA neg     Leukocytes, UA Negative     A/P Motrin for dysmenorrhea UA neg F/u next month for repeat pap and AEX

## 2011-12-02 LAB — URINE CULTURE: Organism ID, Bacteria: NO GROWTH

## 2012-01-12 ENCOUNTER — Encounter: Payer: Self-pay | Admitting: Obstetrics and Gynecology

## 2012-01-12 ENCOUNTER — Ambulatory Visit (INDEPENDENT_AMBULATORY_CARE_PROVIDER_SITE_OTHER): Payer: 59 | Admitting: Obstetrics and Gynecology

## 2012-01-12 VITALS — BP 110/68 | Resp 16 | Ht 61.0 in | Wt 120.0 lb

## 2012-01-12 DIAGNOSIS — Z113 Encounter for screening for infections with a predominantly sexual mode of transmission: Secondary | ICD-10-CM

## 2012-01-12 DIAGNOSIS — Z124 Encounter for screening for malignant neoplasm of cervix: Secondary | ICD-10-CM

## 2012-01-12 DIAGNOSIS — R87612 Low grade squamous intraepithelial lesion on cytologic smear of cervix (LGSIL): Secondary | ICD-10-CM

## 2012-01-12 DIAGNOSIS — N921 Excessive and frequent menstruation with irregular cycle: Secondary | ICD-10-CM

## 2012-01-12 DIAGNOSIS — N898 Other specified noninflammatory disorders of vagina: Secondary | ICD-10-CM

## 2012-01-12 LAB — POCT WET PREP (WET MOUNT)
Bacteria Wet Prep HPF POC: NEGATIVE
Clue Cells Wet Prep Whiff POC: NEGATIVE
Trichomonas Wet Prep HPF POC: NEGATIVE
WBC, Wet Prep HPF POC: NEGATIVE

## 2012-01-12 NOTE — Progress Notes (Signed)
Patient ID: [name], female   DOB: 17-Dec-1975, 36 y.o.   MRN: [id number] Contraception BTL Last pap 04/2011 wnl Last Mammo never Last Colonoscopy never Last Dexa Scan never Primary MD DR. Fulp Abuse at Home none  C/o abnl discharge  Filed Vitals:   01/12/12 1558  BP: 110/68  Resp: 16   ROS: noncontributory  Physical Examination: General appearance - alert, well appearing, and in no distress Neck - supple, no significant adenopathy Chest - clear to auscultation, no wheezes, rales or rhonchi, symmetric air entry Heart - normal rate and regular rhythm Abdomen - soft, nontender, nondistended, no masses or organomegaly Breasts - breasts appear normal, no suspicious masses, no skin or nipple changes or axillary nodes Pelvic - normal external genitalia, vulva, vagina, cervix, uterus and adnexa, brown d/c Back exam - no CVAT Extremities - no edema, redness or tenderness in the calves or thighs  A/P Bleeding b/n cycles is causing pts brown d/c She has h/o polyps and she is hypothyroid as well on levoxyl qd Options and recs discussed Will check labs, TSH, Free T4, prl and CBC Sched sonohyst  - pt is s/p hyst/ d&c with removal of polyp but now considering ablation which she declined at the time.  Will do D&C at time of ablation if that is the direction pt wishes to proceed. Wet prep - neg

## 2012-01-13 LAB — HSV 2 ANTIBODY, IGG: HSV 2 Glycoprotein G Ab, IgG: 0.48 IV

## 2012-01-13 LAB — HSV 1 ANTIBODY, IGG: HSV 1 Glycoprotein G Ab, IgG: 9.56 IV — ABNORMAL HIGH

## 2012-01-13 LAB — RPR

## 2012-01-13 LAB — CBC
Hemoglobin: 11.4 g/dL — ABNORMAL LOW (ref 12.0–15.0)
MCH: 26.9 pg (ref 26.0–34.0)
MCHC: 32.5 g/dL (ref 30.0–36.0)
RDW: 14.3 % (ref 11.5–15.5)

## 2012-01-13 LAB — TSH: TSH: 1.965 u[IU]/mL (ref 0.350–4.500)

## 2012-01-13 LAB — HIV ANTIBODY (ROUTINE TESTING W REFLEX): HIV: NONREACTIVE

## 2012-01-13 LAB — PROLACTIN: Prolactin: 6.4 ng/mL

## 2012-01-14 LAB — PAP IG, CT-NG, RFX HPV ASCU: GC Probe Amp: NEGATIVE

## 2012-02-23 ENCOUNTER — Encounter: Payer: 59 | Admitting: Obstetrics and Gynecology

## 2012-02-28 ENCOUNTER — Encounter: Payer: Self-pay | Admitting: Obstetrics and Gynecology

## 2012-02-28 ENCOUNTER — Ambulatory Visit (INDEPENDENT_AMBULATORY_CARE_PROVIDER_SITE_OTHER): Payer: 59 | Admitting: Obstetrics and Gynecology

## 2012-02-28 VITALS — BP 108/68 | Ht 61.0 in | Wt 125.0 lb

## 2012-02-28 DIAGNOSIS — R6889 Other general symptoms and signs: Secondary | ICD-10-CM

## 2012-02-28 DIAGNOSIS — IMO0002 Reserved for concepts with insufficient information to code with codable children: Secondary | ICD-10-CM

## 2012-02-28 LAB — POCT URINE PREGNANCY: Preg Test, Ur: NEGATIVE

## 2012-02-28 NOTE — Addendum Note (Signed)
Addended by: Marla Roe A on: 02/28/2012 03:49 PM   Modules accepted: Orders

## 2012-02-28 NOTE — Progress Notes (Signed)
Colposcopy Procedure Note  Indications: Pap smear on October 2013 showed: low-grade squamous intraepithelial neoplasia (LGSIL - encompassing HPV,mild dysplasia,CIN I).  Prior cervical treatment: LEEP 2003.  Procedure Details  The risks and benefits of the procedure and Written informed consent obtained.  Speculum placed in vagina and excellent visualization of cervix achieved, cervix swabbed x 3 with acetic acid solution.  Findings: Cervix: acetowhite lesion(s) noted at 6,9,10 and 2 o'clock; bxs and endocervical curettage performed.  Specimens: as above sent to path  Complications: none.  Plan: Specimens labelled and sent to Pathology. Return to discuss Pathology results in 2 weeks.  Osborn Coho MD

## 2012-03-01 LAB — PATHOLOGY

## 2012-03-17 DIAGNOSIS — Z8701 Personal history of pneumonia (recurrent): Secondary | ICD-10-CM | POA: Insufficient documentation

## 2012-03-17 DIAGNOSIS — N870 Mild cervical dysplasia: Secondary | ICD-10-CM | POA: Insufficient documentation

## 2012-03-22 ENCOUNTER — Encounter: Payer: Self-pay | Admitting: Obstetrics and Gynecology

## 2012-03-22 ENCOUNTER — Ambulatory Visit (INDEPENDENT_AMBULATORY_CARE_PROVIDER_SITE_OTHER): Payer: 59 | Admitting: Obstetrics and Gynecology

## 2012-03-22 VITALS — BP 114/66 | Ht 61.0 in | Wt 128.0 lb

## 2012-03-22 DIAGNOSIS — N915 Oligomenorrhea, unspecified: Secondary | ICD-10-CM

## 2012-03-22 DIAGNOSIS — N871 Moderate cervical dysplasia: Secondary | ICD-10-CM

## 2012-03-22 MED ORDER — MEDROXYPROGESTERONE ACETATE 10 MG PO TABS: 10.0000 mg | ORAL_TABLET | Freq: Every day | ORAL | Status: DC

## 2012-03-22 NOTE — Addendum Note (Signed)
Addended by: Osborn Coho on: 03/22/2012 05:26 PM   Modules accepted: Orders

## 2012-03-22 NOTE — Progress Notes (Addendum)
Patient ID: [name], female   DOB: 08/31/1975, 37 y.o.   MRN: [id number] Report Comments: FINAL DIAGNOSIS: A. Cervix- Biopsy, 2 o'clock:  Low grade squamous intraepithelial lesion (LSIL), mild dysplasia and HPV infection, CIN I.  See comment.  B. Cervix- Biopsy, 6 o'clock:  Cervical transformation zone with high grade squamous intraepithelial lesion (HSIL),  moderate dysplasia and HPV infection, CIN II.  See comment.  C. Cervix- Biopsy, 9 o'clock:  Focal low grade squamous intraepithelial lesion (LSIL), mild dysplasia and HPV infection, CIN I.  See comment.   D. Cervix- Biopsy, 10 o'clock:  Cervical transformation zone with low grade squamous intraepithelial lesion (LSIL), mild dysplasia and HPV infection, CIN I.  See comment.  E. Endocervix - Curettage:  Blood and small superficial fragments of benign endocervical mucosa.  COMMENT:  No complaints Filed Vitals:   03/22/12 1618  BP: 114/66   A/P Options and recs reviewed CIN 1 and 2 with neg ECC - I rec tx - will proceed with LEEP (will try to do in office with new LEEP once it arrives) Trial of provera.  If no cycle after provera, rto sooner. Oligomenorrhea - h/o thyroid dysfunction but tsh recently checked and it was nl.  She had thyroid u/s a few yrs ago and was nl per pt.  Will repeat if no induced cycle.

## 2012-04-18 ENCOUNTER — Telehealth: Payer: Self-pay | Admitting: Obstetrics and Gynecology

## 2012-04-29 ENCOUNTER — Other Ambulatory Visit: Payer: Self-pay

## 2012-05-03 ENCOUNTER — Encounter: Payer: Self-pay | Admitting: Obstetrics and Gynecology

## 2012-05-04 ENCOUNTER — Telehealth: Payer: Self-pay | Admitting: Obstetrics and Gynecology

## 2012-05-04 ENCOUNTER — Other Ambulatory Visit: Payer: Self-pay

## 2012-05-04 MED ORDER — PROMETHAZINE HCL 25 MG PO TABS: ORAL_TABLET | ORAL | Status: DC

## 2012-05-04 MED ORDER — IBUPROFEN 800 MG PO TABS: ORAL_TABLET | ORAL | Status: DC

## 2012-05-04 MED ORDER — HYDROCODONE-ACETAMINOPHEN 5-300 MG PO TABS: ORAL_TABLET | ORAL | Status: DC

## 2012-05-04 MED ORDER — DIAZEPAM 10 MG PO TABS: ORAL_TABLET | ORAL | Status: DC

## 2012-06-01 ENCOUNTER — Telehealth: Payer: Self-pay | Admitting: Obstetrics and Gynecology

## 2012-06-01 NOTE — Telephone Encounter (Signed)
Office LEEP Procedure scheduled for 06/19/12@ 11:00. [employer] effective 03/15/12. Plan pays 80/20 after a $2,500 deductible. Pre-op due $435.95. -Adrianne Pridgen

## 2012-08-02 ENCOUNTER — Other Ambulatory Visit: Payer: Self-pay | Admitting: Family Medicine

## 2012-08-02 DIAGNOSIS — E010 Iodine-deficiency related diffuse (endemic) goiter: Secondary | ICD-10-CM

## 2012-08-11 ENCOUNTER — Ambulatory Visit
Admission: RE | Admit: 2012-08-11 | Discharge: 2012-08-11 | Disposition: A | Discharge status: Home / Self Care | Payer: 59 | Source: Ambulatory Visit | Attending: Family Medicine | Admitting: Family Medicine

## 2012-08-11 DIAGNOSIS — E010 Iodine-deficiency related diffuse (endemic) goiter: Secondary | ICD-10-CM

## 2012-10-05 ENCOUNTER — Other Ambulatory Visit: Payer: Self-pay | Admitting: Obstetrics and Gynecology

## 2013-01-18 ENCOUNTER — Other Ambulatory Visit: Payer: Self-pay

## 2013-04-30 ENCOUNTER — Other Ambulatory Visit: Payer: Self-pay | Admitting: Obstetrics and Gynecology

## 2013-05-01 ENCOUNTER — Encounter (HOSPITAL_COMMUNITY): Payer: Self-pay | Admitting: Emergency Medicine

## 2013-05-01 ENCOUNTER — Emergency Department (INDEPENDENT_AMBULATORY_CARE_PROVIDER_SITE_OTHER)
Admission: EM | Admit: 2013-05-01 | Discharge: 2013-05-01 | Disposition: A | Discharge status: Home / Self Care | Payer: 59 | Source: Home / Self Care | Attending: Emergency Medicine | Admitting: Emergency Medicine

## 2013-05-01 DIAGNOSIS — J039 Acute tonsillitis, unspecified: Secondary | ICD-10-CM

## 2013-05-01 LAB — POCT RAPID STREP A: Streptococcus, Group A Screen (Direct): NEGATIVE

## 2013-05-01 MED ORDER — AMOXICILLIN 500 MG PO CAPS: 500.0000 mg | ORAL_CAPSULE | Freq: Three times a day (TID) | ORAL | Status: DC

## 2013-05-01 NOTE — ED Provider Notes (Signed)
Chief Complaint   Chief Complaint  Patient presents with  . URI    History of Present Illness   [name] is a 38 year old female who's had a two-day history of sore throat, subjective fever, chills, muscle aches, headaches, nausea. She has had no sick exposures. She denies any nasal congestion, rhinorrhea, cough, vomiting, or diarrhea. She had a LEEP procedure done yesterday. Her symptoms came on a couple of hours later. She denies any abdominal pain or abnormal vaginal discharge, odor, or itching.   Review of Systems   Other than as noted above, the patient denies any of the following symptoms. Systemic:  No fever, chills, sweats, myalgias, or headache. Eye:  No redness, pain or drainage. ENT:  No earache, nasal congestion, sneezing, rhinorrhea, sinus pressure, sinus pain, or post nasal drip. Lungs:  No cough, sputum production, wheezing, shortness of breath, or chest pain. GI:  No abdominal pain, nausea, vomiting, or diarrhea. Skin:  No rash.  PMFSH   Past medical history, family history, social history, meds, and allergies were reviewed. She has hypothyroidism and takes Synthroid.  Physical Exam     Vital signs:  BP 101/64  Pulse 89  Temp(Src) 98 F (36.7 C) (Oral)  Resp 12  SpO2 100%  LMP 12/24/2012 General:  Alert, in no distress. Phonation was normal, no drooling, and patient was able to handle secretions well.  Eye:  No conjunctival injection or drainage. Lids were normal. ENT:  TMs and canals were normal, without erythema or inflammation.  Nasal mucosa was clear and uncongested, without drainage.  Mucous membranes were moist.  Exam of pharynx tonsils were enlarged and red without any exudate.  There were no oral ulcerations or lesions. There was no bulging of the tonsillar pillars, and the uvula was midline. Neck:  Supple, no adenopathy, tenderness or mass. Lungs:  No respiratory distress.  Lungs were clear to auscultation, without wheezes, rales or rhonchi.   Breath sounds were clear and equal bilaterally.  Heart:  Regular rhythm, without gallops, murmers or rubs. Abdomen: Soft, nontender, no organomegaly or mass. Bowel sounds are normally active. Skin:  Clear, warm, and dry, without rash or lesions.  Labs   Results for orders placed during the hospital encounter of 05/01/13  POCT RAPID STREP A ([facility] ONLY)      Result Value Ref Range   Streptococcus, Group A Screen (Direct) NEGATIVE  NEGATIVE   Assessment   The encounter diagnosis was Tonsillitis.  There is no evidence of a peritonsillar abscess.  I do not think her symptoms are related to her LEEP procedure. Her abdomen is completely benign and she has no pelvic pain or abnormal bleeding or discharge.  Plan     1.  Meds:  The following meds were prescribed:   Discharge Medication List as of 05/01/2013  9:09 PM    START taking these medications   Details  amoxicillin (AMOXIL) 500 MG capsule Take 1 capsule (500 mg total) by mouth 3 (three) times daily., Starting 05/01/2013, Until Discontinued, Normal        2.  Patient Education/Counseling:  The patient was given appropriate handouts, self care instructions, and instructed in symptomatic relief, including hot saline gargles, throat lozenges, infectious precautions, and need to trade out toothbrush.    3.  Follow up:  The patient was told to follow up here if no better in 3 to 4 days, or sooner if becoming worse in any way, and given some red flag symptoms such  as difficulty swallowing or breathing which would prompt immediate return.       Reuben Likes, MD 05/01/13 2150

## 2013-05-01 NOTE — ED Notes (Signed)
Dr. Lorenz Coaster is in the room w/the pt Pt c/o cold sxs onset yest night sxs include: fevers, chills, sore throat, muscle pain Has taken 800mg  of ibup Alert w/no signs of acute distress.

## 2013-05-01 NOTE — Discharge Instructions (Signed)

## 2013-05-04 LAB — CULTURE, GROUP A STREP

## 2013-05-05 ENCOUNTER — Telehealth (HOSPITAL_COMMUNITY): Payer: Self-pay

## 2013-05-05 NOTE — ED Notes (Signed)
Patient called requesting lab results.  Chart reviewed.  Throat culture was negative.  Patient made aware

## 2013-08-10 ENCOUNTER — Other Ambulatory Visit: Payer: Self-pay | Admitting: Obstetrics and Gynecology

## 2013-08-10 DIAGNOSIS — N83209 Unspecified ovarian cyst, unspecified side: Secondary | ICD-10-CM

## 2013-08-15 ENCOUNTER — Ambulatory Visit
Admission: RE | Admit: 2013-08-15 | Discharge: 2013-08-15 | Disposition: A | Discharge status: Home / Self Care | Payer: 59 | Source: Ambulatory Visit | Attending: Obstetrics and Gynecology | Admitting: Obstetrics and Gynecology

## 2013-08-15 DIAGNOSIS — N83209 Unspecified ovarian cyst, unspecified side: Secondary | ICD-10-CM

## 2013-08-15 MED ORDER — IOHEXOL 300 MG/ML  SOLN
100.0000 mL | Freq: Once | INTRAMUSCULAR | Status: AC | PRN
  Administered 2013-08-15: 100 mL via INTRAVENOUS

## 2013-08-20 ENCOUNTER — Other Ambulatory Visit: Payer: Self-pay | Admitting: Obstetrics and Gynecology

## 2013-09-19 ENCOUNTER — Emergency Department (HOSPITAL_COMMUNITY)
Admission: EM | Admit: 2013-09-19 | Discharge: 2013-09-20 | Disposition: A | Discharge status: Home / Self Care | Payer: 59 | Attending: Emergency Medicine | Admitting: Emergency Medicine

## 2013-09-19 ENCOUNTER — Encounter (HOSPITAL_COMMUNITY): Payer: Self-pay | Admitting: Emergency Medicine

## 2013-09-19 ENCOUNTER — Emergency Department (HOSPITAL_COMMUNITY): Payer: 59

## 2013-09-19 DIAGNOSIS — R0789 Other chest pain: Secondary | ICD-10-CM | POA: Insufficient documentation

## 2013-09-19 DIAGNOSIS — Z3202 Encounter for pregnancy test, result negative: Secondary | ICD-10-CM | POA: Insufficient documentation

## 2013-09-19 DIAGNOSIS — Z8742 Personal history of other diseases of the female genital tract: Secondary | ICD-10-CM | POA: Insufficient documentation

## 2013-09-19 DIAGNOSIS — Z79899 Other long term (current) drug therapy: Secondary | ICD-10-CM | POA: Insufficient documentation

## 2013-09-19 DIAGNOSIS — Z8701 Personal history of pneumonia (recurrent): Secondary | ICD-10-CM | POA: Insufficient documentation

## 2013-09-19 DIAGNOSIS — E039 Hypothyroidism, unspecified: Secondary | ICD-10-CM | POA: Insufficient documentation

## 2013-09-19 LAB — PRO B NATRIURETIC PEPTIDE: Pro B Natriuretic peptide (BNP): 29 pg/mL (ref 0–125)

## 2013-09-19 LAB — POC URINE PREG, ED: PREG TEST UR: NEGATIVE

## 2013-09-19 LAB — CBC
HCT: 36.7 % (ref 36.0–46.0)
Hemoglobin: 11.9 g/dL — ABNORMAL LOW (ref 12.0–15.0)
MCH: 27.1 pg (ref 26.0–34.0)
MCHC: 32.4 g/dL (ref 30.0–36.0)
MCV: 83.6 fL (ref 78.0–100.0)
PLATELETS: 239 10*3/uL (ref 150–400)
RBC: 4.39 MIL/uL (ref 3.87–5.11)
RDW: 12.9 % (ref 11.5–15.5)
WBC: 5.8 10*3/uL (ref 4.0–10.5)

## 2013-09-19 LAB — I-STAT TROPONIN, ED: TROPONIN I, POC: 0.01 ng/mL (ref 0.00–0.08)

## 2013-09-19 MED ORDER — ASPIRIN 325 MG PO TABS
325.0000 mg | ORAL_TABLET | ORAL | Status: AC
  Administered 2013-09-19: 325 mg via ORAL
  Filled 2013-09-19: qty 1

## 2013-09-19 MED ORDER — KETOROLAC TROMETHAMINE 30 MG/ML IJ SOLN
30.0000 mg | Freq: Once | INTRAMUSCULAR | Status: AC
  Administered 2013-09-20: 30 mg via INTRAVENOUS
  Filled 2013-09-19: qty 1

## 2013-09-19 NOTE — ED Provider Notes (Signed)
CSN: [id number]     Arrival date & time 09/19/13  2201 History   First MD Initiated Contact with Patient 09/19/13 2253     Chief Complaint  Patient presents with  . Chest Pain     (Consider location/radiation/quality/duration/timing/severity/associated sxs/prior Treatment) Patient is a 38 y.o. female presenting with chest pain. The history is provided by the patient. No language interpreter was used.  Chest Pain Pain location:  Substernal area Pain quality: aching   Pain radiates to:  Neck and upper back (also mild h/a, intermittently) Pain radiates to the back: yes   Pain severity:  Moderate Onset quality:  Unable to specify Duration:  1 week Timing:  Constant Progression:  Waxing and waning Chronicity:  New Context: at rest   Relieved by:  Nothing Worsened by:  Nothing tried Ineffective treatments: pepcid, tums, carafate. Associated symptoms: shortness of breath   Associated symptoms: no abdominal pain, no anorexia, no anxiety, no back pain, no cough, no diaphoresis, no fatigue, no fever, no headache, no nausea, no near-syncope, no numbness, no palpitations, no syncope, not vomiting and no weakness   Shortness of breath:    Severity:  Mild   Duration:  1 week   Timing:  Intermittent   Progression:  Waxing and waning Risk factors: no aortic disease, no birth control, no coronary artery disease, no diabetes mellitus, no high cholesterol, no hypertension, no immobilization, not female, not obese, no prior DVT/PE, no smoking and no surgery     Past Medical History  Diagnosis Date  . Hypothyroidism   . Abnormal Pap smear   . H/O: pneumonia   . Dysplasia of cervix, low grade (CIN 1)    Past Surgical History  Procedure Laterality Date  . Cesarean section    . Laparoscopic tubal ligation  02/01/2011    Procedure: LAPAROSCOPIC TUBAL LIGATION;  Surgeon: Purcell Nails, MD;  Location: [facility];  Service: Gynecology;  Laterality: Bilateral;  . Leep     No family history on  file. History  Substance Use Topics  . Smoking status: Never Smoker   . Smokeless tobacco: Never Used  . Alcohol Use: No   OB History   Grav Para Term Preterm Abortions TAB SAB Ect Mult Living   2 2             Review of Systems  Constitutional: Negative for fever, chills, diaphoresis, activity change, appetite change and fatigue.  HENT: Negative for congestion, facial swelling, rhinorrhea and sore throat.   Eyes: Negative for photophobia and discharge.  Respiratory: Positive for shortness of breath. Negative for cough and chest tightness.   Cardiovascular: Positive for chest pain. Negative for palpitations, leg swelling, syncope and near-syncope.  Gastrointestinal: Negative for nausea, vomiting, abdominal pain, diarrhea and anorexia.  Endocrine: Negative for polydipsia and polyuria.  Genitourinary: Negative for dysuria, frequency, difficulty urinating and pelvic pain.  Musculoskeletal: Negative for arthralgias, back pain, neck pain and neck stiffness.  Skin: Negative for color change and wound.  Allergic/Immunologic: Negative for immunocompromised state.  Neurological: Negative for facial asymmetry, weakness, numbness and headaches.  Hematological: Does not bruise/bleed easily.  Psychiatric/Behavioral: Negative for confusion and agitation.      Allergies  Other  Home Medications   Prior to Admission medications   Medication Sig Start Date End Date Taking? Authorizing Provider  aspirin-acetaminophen-caffeine (EXCEDRIN MIGRAINE) [id number] MG per tablet Take 2 tablets by mouth every 6 (six) hours as needed. Patient is using this medication for headache. 02/01/11  Yes Woodroe Mode  Su Hilt, MD  calcium carbonate (TUMS - DOSED IN MG ELEMENTAL CALCIUM) 500 MG chewable tablet Chew 2 tablets by mouth 2 (two) times daily.   Yes Historical Provider, MD  ibuprofen (ADVIL,MOTRIN) 200 MG tablet Take 400 mg by mouth every 6 (six) hours as needed for moderate pain.   Yes Historical Provider, MD   levothyroxine (SYNTHROID, LEVOTHROID) 50 MCG tablet Take 50 mcg by mouth daily.     Yes Historical Provider, MD  Multiple Vitamin (MULTIVITAMIN WITH MINERALS) TABS tablet Take 1 tablet by mouth daily.   Yes Historical Provider, MD  omeprazole (PRILOSEC) 20 MG capsule Take 20 mg by mouth daily.   Yes Historical Provider, MD  sucralfate (CARAFATE) 1 GM/10ML suspension Take 2 g by mouth daily.   Yes Historical Provider, MD  Vitamin D, Cholecalciferol, 1000 UNITS TABS Take 1 tablet by mouth daily.   Yes Historical Provider, MD   BP 123/77  Pulse 66  Temp(Src) 98.2 F (36.8 C) (Oral)  Resp 13  Ht 5\' 1"  (1.549 m)  Wt 125 lb (56.7 kg)  BMI 23.63 kg/m2  SpO2 98%  LMP 08/26/2013 Physical Exam  Constitutional: She is oriented to person, place, and time. She appears well-developed and well-nourished. No distress.  HENT:  Head: Normocephalic and atraumatic.  Mouth/Throat: No oropharyngeal exudate.  Eyes: Pupils are equal, round, and reactive to light.  Neck: Normal range of motion. Neck supple.  Cardiovascular: Normal rate, regular rhythm and normal heart sounds.  Exam reveals no gallop and no friction rub.   No murmur heard. Pulmonary/Chest: Effort normal and breath sounds normal. No respiratory distress. She has no wheezes. She has no rales.  Abdominal: Soft. Bowel sounds are normal. She exhibits no distension and no mass. There is no tenderness. There is no rebound and no guarding.  Musculoskeletal: Normal range of motion. She exhibits no edema and no tenderness.  Neurological: She is alert and oriented to person, place, and time.  Skin: Skin is warm and dry.  Psychiatric: She has a normal mood and affect.    ED Course  Procedures (including critical care time) Labs Review Labs Reviewed  CBC - Abnormal; Notable for the following:    Hemoglobin 11.9 (*)    All other components within normal limits  BASIC METABOLIC PANEL - Abnormal; Notable for the following:    Anion gap 19 (*)     All other components within normal limits  PRO B NATRIURETIC PEPTIDE  POC URINE PREG, ED  I-STAT TROPOININ, ED  Rosezena Sensor, ED    Imaging Review Dg Chest Port 1 View  09/19/2013   CLINICAL DATA:  Chest pain and shortness of breath for 1 week.  EXAM: PORTABLE CHEST - 1 VIEW  COMPARISON:  None.  FINDINGS: The heart size and mediastinal contours are within normal limits. Both lungs are clear. The visualized skeletal structures are unremarkable.  IMPRESSION: No active disease.   Electronically Signed   By: Burman Nieves M.D.   On: 09/19/2013 22:54     EKG Interpretation   Date/Time:  Wednesday September 19 2013 22:05:44 EDT Ventricular Rate:  63 PR Interval:  142 QRS Duration: 84 QT Interval:  390 QTC Calculation: 399 R Axis:   69 Text Interpretation:  Normal sinus rhythm Normal ECG No prior for  comparison Confirmed by DOCHERTY  MD, MEGAN ([id number]) on 09/20/2013 1:13:00 AM      MDM   Final diagnoses:  Chest pain, atypical    Pt is a 38 y.o. female with  Pmhx as above who presents with central, aching CP, constant though waxing/waning for 1 week with mild assoc SOB. NO fever, chills, cough, n/v, diaphoresis, leg pain/edema.  No agreevating or alleviating factors.  No tobacco use. PERC negative. TIMI 0.  EKG w/o ischemic changes. Trop negative. BNP not elevated. CXR nml. Hb stable. Doubt cardiogenic cause of pain, and I feel is more likely MSK pain. Will rec scheduled NSAIDs and close PCP f/u. Return precautions given for new or worsening symptoms including worsening pain, fever, leg swelling, SOB.         Shanna Cisco, MD 09/20/13 [phone]

## 2013-09-19 NOTE — ED Notes (Signed)
EKG given to EDP,Pickering, MD., for review. 

## 2013-09-19 NOTE — ED Notes (Signed)
Pt states she has been having chest pain in her mid chest that radiates through to her rt side and back, neck and head. Pt states the pain gets worse when she takes a deep breath or coughs. Pt was seen at PCP on Thursday and was instructed to take prilosec and tums. Pt states it did not help so she had a prescription called in for Carafate, which also has not helped. Denies any n/v, dizziness, LOC, or diaphoresis.

## 2013-09-20 LAB — BASIC METABOLIC PANEL
Anion gap: 19 — ABNORMAL HIGH (ref 5–15)
BUN: 11 mg/dL (ref 6–23)
CALCIUM: 10.1 mg/dL (ref 8.4–10.5)
CO2: 21 mEq/L (ref 19–32)
CREATININE: 0.7 mg/dL (ref 0.50–1.10)
Chloride: 97 mEq/L (ref 96–112)
GFR calc Af Amer: >90 mL/min (ref >90)
GLUCOSE: 95 mg/dL (ref 70–99)
Potassium: 3.8 mEq/L (ref 3.7–5.3)
Sodium: 137 mEq/L (ref 137–147)

## 2013-09-20 NOTE — Discharge Instructions (Signed)
Chest Wall Pain °Chest wall pain is pain felt in or around the chest bones and muscles. It may take up to 6 weeks to get better. It may take longer if you are active. Chest wall pain can happen on its own. Other times, things like germs, injury, coughing, or exercise can cause the pain. °HOME CARE  °· Avoid activities that make you tired or cause pain. Try not to use your chest, belly (abdominal), or side muscles. Do not use heavy weights. °· Put ice on the sore area. °¨ Put ice in a plastic bag. °¨ Place a towel between your skin and the bag. °¨ Leave the ice on for 15-20 minutes for the first 2 days. °· Only take medicine as told by your doctor. °GET HELP RIGHT AWAY IF:  °· You have more pain or are very uncomfortable. °· You have a fever. °· Your chest pain gets worse. °· You have new problems. °· You feel sick to your stomach (nauseous) or throw up (vomit). °· You start to sweat or feel lightheaded. °· You have a cough with mucus (phlegm). °· You cough up blood. °MAKE SURE YOU:  °· Understand these instructions. °· Will watch your condition. °· Will get help right away if you are not doing well or get worse. °Document Released: 08/18/2007 Document Revised: 05/24/2011 Document Reviewed: 10/26/2010 °[employer]® Patient Information ©2015 [employer]. This information is not intended to replace advice given to you by your health care provider. Make sure you discuss any questions you have with your health care provider. ° °

## 2013-12-28 ENCOUNTER — Other Ambulatory Visit: Payer: Self-pay

## 2014-01-14 ENCOUNTER — Encounter (HOSPITAL_COMMUNITY): Payer: Self-pay | Admitting: Emergency Medicine

## 2014-03-14 ENCOUNTER — Other Ambulatory Visit: Payer: Self-pay | Admitting: Obstetrics and Gynecology

## 2018-08-21 ENCOUNTER — Other Ambulatory Visit: Payer: Self-pay | Admitting: Family Medicine

## 2018-08-21 ENCOUNTER — Ambulatory Visit
Admission: RE | Admit: 2018-08-21 | Discharge: 2018-08-21 | Disposition: A | Discharge status: Home / Self Care | Payer: 59 | Source: Ambulatory Visit | Attending: Family Medicine | Admitting: Family Medicine

## 2018-08-21 DIAGNOSIS — R05 Cough: Secondary | ICD-10-CM

## 2018-08-21 DIAGNOSIS — R059 Cough, unspecified: Secondary | ICD-10-CM

## 2018-09-07 ENCOUNTER — Other Ambulatory Visit: Payer: Self-pay

## 2018-09-07 ENCOUNTER — Ambulatory Visit (INDEPENDENT_AMBULATORY_CARE_PROVIDER_SITE_OTHER): Payer: 59 | Admitting: Emergency Medicine

## 2018-09-07 ENCOUNTER — Encounter: Payer: Self-pay | Admitting: Emergency Medicine

## 2018-09-07 VITALS — BP 134/84 | HR 68 | Ht 61.0 in | Wt 138.0 lb

## 2018-09-07 DIAGNOSIS — M0579 Rheumatoid arthritis with rheumatoid factor of multiple sites without organ or systems involvement: Secondary | ICD-10-CM | POA: Diagnosis not present

## 2018-09-07 DIAGNOSIS — R05 Cough: Secondary | ICD-10-CM

## 2018-09-07 DIAGNOSIS — M069 Rheumatoid arthritis, unspecified: Secondary | ICD-10-CM | POA: Insufficient documentation

## 2018-09-07 DIAGNOSIS — R053 Chronic cough: Secondary | ICD-10-CM | POA: Insufficient documentation

## 2018-09-07 DIAGNOSIS — R059 Cough, unspecified: Secondary | ICD-10-CM

## 2018-09-07 MED ORDER — LORATADINE 10 MG PO TABS: 10.0000 mg | ORAL_TABLET | Freq: Every day | ORAL | 5 refills | Status: DC

## 2018-09-07 MED ORDER — BENZONATATE 100 MG PO CAPS: 100.0000 mg | ORAL_CAPSULE | Freq: Three times a day (TID) | ORAL | 2 refills | Status: DC | PRN

## 2018-09-07 MED ORDER — PANTOPRAZOLE SODIUM 40 MG PO TBEC: 40.0000 mg | DELAYED_RELEASE_TABLET | Freq: Every day | ORAL | 5 refills | Status: DC

## 2018-09-07 NOTE — Patient Instructions (Signed)
We will perform pulmonary function testing Please start pantoprazole 40 mg once daily until next visit.  Take this medication 1 hour around food. Please start loratadine 10 mg (Claritin) once daily until next visit Try your best to avoid throat clearing.  You may want to use a sugar-free candy, when you feel the urge to clear your throat just swallow. Try using Tessalon Perles 100 mg up to every 8 hours if needed to suppress your cough. Depending on how the medications above help your cough, we may decide to perform a CT scan of your chest.  We will discuss this next visit. Follow with Dr Lamonte Sakai in 1 month

## 2018-09-07 NOTE — Assessment & Plan Note (Signed)
Given her history of RA, the methotrexate use, I believe she needs baseline PFTs.  This will give [place] a baseline and allow [place] to reassess if she develops clinical symptoms going forward.

## 2018-09-07 NOTE — Assessment & Plan Note (Signed)
Based on our discussion it sounds like there are probably contributions from untreated GERD, some low-level allergic rhinitis.  I think we should treat both more aggressively to see if we can impact her cough.  She will use Tessalon for cough suppression.  We discussed voice rest and avoiding throat clearing.  She has rheumatoid arthritis and is on methotrexate but I do not see any evidence for parenchymal abnormality, nodular disease on her chest x-ray from 08/21/2018.  If her cough continues with the interventions above then I believe we should perform a CT scan of her chest to characterize her parenchyma.

## 2018-09-07 NOTE — Progress Notes (Signed)
Subjective:    Patient ID: [name], female    DOB: [birth year], 43 y.o.   MRN: [id number]  HPI [name] is 43, never smoker with hypothyroidism, RA on MTX and has had pneumonia before.  She was seen by ENT in January for acute parotiditis.   She began to have cough about 6 months ago, in the am produces some clear to yellow mucous. During the days she can have dry cough when talking or laughing, fumes. Water sometimes helps. Does not bother he when sleeping. Occasional nasal mucous and congestion, no real globus sensation. No hemoptysis. Can be associated with some HA. She is dealing with a lot heartburn, has been for 2 yrs, uses TUMS. Has been given abx, no change.   Chest x-ray 08/21/2018 reviewed by me, shows no infiltrates, no cardiopulmonary disease.  Review of Systems  Constitutional: Negative for fever and unexpected weight change.  HENT: Negative for congestion, dental problem, ear pain, nosebleeds, postnasal drip, rhinorrhea, sinus pressure, sneezing, sore throat and trouble swallowing.   Eyes: Negative for redness and itching.  Respiratory: Positive for cough. Negative for chest tightness, shortness of breath and wheezing.   Cardiovascular: Negative for palpitations and leg swelling.  Gastrointestinal: Negative for nausea and vomiting.  Genitourinary: Negative for dysuria.  Musculoskeletal: Negative for joint swelling.  Skin: Negative for rash.  Neurological: Positive for headaches.  Hematological: Does not bruise/bleed easily.  Psychiatric/Behavioral: Negative for dysphoric mood. The patient is not nervous/anxious.    Past Medical History:  Diagnosis Date  . Abnormal Pap smear   . Dysplasia of cervix, low grade (CIN 1)   . H/O: pneumonia   . Hypothyroidism   . Rheumatoid arthritis (HCC)      No family history on file.   Social History   Socioeconomic History  . Marital status: Married    Spouse name: Not on file  . Number of children: Not on file  .  Years of education: Not on file  . Highest education level: Not on file  Occupational History  . Not on file  Social Needs  . Financial resource strain: Not on file  . Food insecurity    Worry: Not on file    Inability: Not on file  . Transportation needs    Medical: Not on file    Non-medical: Not on file  Tobacco Use  . Smoking status: Never Smoker  . Smokeless tobacco: Never Used  Substance and Sexual Activity  . Alcohol use: No  . Drug use: No  . Sexual activity: Yes    Birth control/protection: Surgical    Comment: BTL  Lifestyle  . Physical activity    Days per week: Not on file    Minutes per session: Not on file  . Stress: Not on file  Relationships  . Social Musician on phone: Not on file    Gets together: Not on file    Attends religious service: Not on file    Active member of club or organization: Not on file    Attends meetings of clubs or organizations: Not on file    Relationship status: Not on file  . Intimate partner violence    Fear of current or ex partner: Not on file    Emotionally abused: Not on file    Physically abused: Not on file    Forced sexual activity: Not on file  Other Topics Concern  . Not on file  Social History Narrative  .  Not on file     Allergies  Allergen Reactions  . Other     Cats Fruit      Outpatient Medications Prior to Visit  Medication Sig Dispense Refill  . aspirin-acetaminophen-caffeine (EXCEDRIN MIGRAINE) 250-250-65 MG per tablet Take 2 tablets by mouth every 6 (six) hours as needed. Patient is using this medication for headache. 30 tablet 0  . calcium carbonate (TUMS - DOSED IN MG ELEMENTAL CALCIUM) 500 MG chewable tablet Chew 2 tablets by mouth 2 (two) times daily.    Marland Kitchen ibuprofen (ADVIL,MOTRIN) 200 MG tablet Take 400 mg by mouth every 6 (six) hours as needed for moderate pain.    Marland Kitchen levothyroxine (SYNTHROID, LEVOTHROID) 50 MCG tablet Take 50 mcg by mouth daily.      . methotrexate (RHEUMATREX) 2.5  MG tablet Take 15 mg by mouth once a week. Caution:Chemotherapy. Protect from light.    . Multiple Vitamin (MULTIVITAMIN WITH MINERALS) TABS tablet Take 1 tablet by mouth daily.    Marland Kitchen omeprazole (PRILOSEC) 20 MG capsule Take 20 mg by mouth daily.    . sucralfate (CARAFATE) 1 GM/10ML suspension Take 2 g by mouth daily.    . Vitamin D, Cholecalciferol, 1000 UNITS TABS Take 1 tablet by mouth daily.     No facility-administered medications prior to visit.         Objective:   Physical Exam  Vitals:   09/07/18 1447  BP: 134/84  Pulse: 68  SpO2: 94%  Weight: 138 lb (62.6 kg)  Height: 5\' 1"  (1.549 m)   Gen: Pleasant, well-nourished, in no distress,  normal affect  ENT: No lesions,  mouth clear,  oropharynx clear, no postnasal drip  Neck: No JVD, no stridor  Lungs: No use of accessory muscles, no crackles or wheezing on normal respiration, no wheeze on forced expiration  Cardiovascular: RRR, heart sounds normal, no murmur or gallops, no peripheral edema  Musculoskeletal: very subtle B PIP joint changes, no cyanosis or clubbing  Neuro: alert, awake, non focal  Skin: Warm, no lesions or rash     Assessment & Plan:  Chronic cough Based on our discussion it sounds like there are probably contributions from untreated GERD, some low-level allergic rhinitis.  I think we should treat both more aggressively to see if we can impact her cough.  She will use Tessalon for cough suppression.  We discussed voice rest and avoiding throat clearing.  She has rheumatoid arthritis and is on methotrexate but I do not see any evidence for parenchymal abnormality, nodular disease on her chest x-ray from 08/21/2018.  If her cough continues with the interventions above then I believe we should perform a CT scan of her chest to characterize her parenchyma.  RA (rheumatoid arthritis) (HCC) Given her history of RA, the methotrexate use, I believe she needs baseline PFTs.  This will give [place] a baseline and allow  [place] to reassess if she develops clinical symptoms going forward.  Baltazar Apo, MD, PhD 09/07/2018, 3:27 PM [name] [phone] or if no answer [phone]

## 2018-10-03 ENCOUNTER — Other Ambulatory Visit: Payer: Self-pay | Admitting: Emergency Medicine

## 2018-10-05 ENCOUNTER — Other Ambulatory Visit (HOSPITAL_COMMUNITY)
Admission: RE | Admit: 2018-10-05 | Discharge: 2018-10-05 | Disposition: A | Discharge status: Home / Self Care | Payer: 59 | Source: Ambulatory Visit | Attending: Emergency Medicine | Admitting: Emergency Medicine

## 2018-10-05 DIAGNOSIS — Z1159 Encounter for screening for other viral diseases: Secondary | ICD-10-CM | POA: Insufficient documentation

## 2018-10-05 LAB — SARS CORONAVIRUS 2 (TAT 6-24 HRS): SARS Coronavirus 2: NEGATIVE

## 2018-10-09 ENCOUNTER — Ambulatory Visit (INDEPENDENT_AMBULATORY_CARE_PROVIDER_SITE_OTHER): Payer: 59 | Admitting: Emergency Medicine

## 2018-10-09 ENCOUNTER — Encounter (HOSPITAL_COMMUNITY): Payer: 59

## 2018-10-09 ENCOUNTER — Encounter: Payer: Self-pay | Admitting: Emergency Medicine

## 2018-10-09 ENCOUNTER — Other Ambulatory Visit: Payer: Self-pay

## 2018-10-09 DIAGNOSIS — R05 Cough: Secondary | ICD-10-CM

## 2018-10-09 DIAGNOSIS — R059 Cough, unspecified: Secondary | ICD-10-CM

## 2018-10-09 DIAGNOSIS — J984 Other disorders of lung: Secondary | ICD-10-CM | POA: Insufficient documentation

## 2018-10-09 DIAGNOSIS — J849 Interstitial pulmonary disease, unspecified: Secondary | ICD-10-CM | POA: Diagnosis not present

## 2018-10-09 LAB — PULMONARY FUNCTION TEST
DL/VA % pred: 128 %
DL/VA: 5.7 ml/min/mmHg/L
DLCO unc % pred: 75 %
DLCO unc: 15.54 ml/min/mmHg
FEF 25-75 Post: 2.38 L/sec
FEF 25-75 Pre: 2.06 L/sec
FEF2575-%Change-Post: 15 %
FEF2575-%Pred-Post: 92 %
FEF2575-%Pred-Pre: 80 %
FEV1-%Change-Post: 9 %
FEV1-%Pred-Post: 74 %
FEV1-%Pred-Pre: 67 %
FEV1-Post: 1.73 L
FEV1-Pre: 1.57 L
FEV1FVC-%Change-Post: 8 %
FEV1FVC-%Pred-Pre: 102 %
FEV6-%Change-Post: 2 %
FEV6-%Pred-Post: 67 %
FEV6-%Pred-Pre: 65 %
FEV6-Post: 1.88 L
FEV6-Pre: 1.83 L
FEV6FVC-%Pred-Post: 102 %
FEV6FVC-%Pred-Pre: 102 %
FVC-%Change-Post: 0 %
FVC-%Pred-Post: 65 %
FVC-%Pred-Pre: 65 %
FVC-Post: 1.88 L
Post FEV1/FVC ratio: 92 %
Post FEV6/FVC ratio: 100 %
Pre FEV1/FVC ratio: 84 %
Pre FEV6/FVC Ratio: 100 %
RV % pred: 74 %
RV: 1.19 L
TLC % pred: 63 %
TLC: 3.07 L

## 2018-10-09 NOTE — Patient Instructions (Signed)
We will perform a high-resolution CT scan of the chest without contrast Continue your pantoprazole 40 mg once daily as you have been taking it. Continue loratadine 10 mg once daily as you have been taking it Try using albuterol 2 puffs if needed for shortness of breath, chest tightness, coughing to see if this makes your symptoms better. Depending on how your cough progresses we may decide to perform further testing including possible bronchoscopy to visualize your vocal cords and airways. Follow with Dr Lamonte Sakai next available after your CT scan to review the results together.

## 2018-10-09 NOTE — Assessment & Plan Note (Signed)
Noted on pulmonary function testing today.  No clear evidence for ILD on chest x-ray.  I believe she needs a high-resolution CT scan of the chest to evaluate further.  She does have a history of RA, methotrexate use.  Consider respiratory muscle weakness.  No evidence of an elevated hemidiaphragm on chest x-ray.

## 2018-10-09 NOTE — Progress Notes (Signed)
Full PFT performed today. °

## 2018-10-09 NOTE — Assessment & Plan Note (Signed)
Evidence for possible mild obstruction superimposed on restriction on her pulmonary function testing.  Consider occult asthma.  We will do therapeutic trial of albuterol, use as needed and see if this impacts her cough.  We will also continue her GERD regimen, loratadine.  If the cough continues and I believe she will need bronchoscopy and airway inspection, cell count for eosinophils.

## 2018-10-09 NOTE — Progress Notes (Signed)
Subjective:    Patient ID: [name], female    DOB: 07-20-1975, 43 y.o.   MRN: [id number]  HPI [name] is 43, never smoker with hypothyroidism, RA on MTX and has had pneumonia before.  She was seen by ENT in January for acute parotiditis.   She began to have cough about 6 months ago, in the am produces some clear to yellow mucous. During the days she can have dry cough when talking or laughing, fumes. Water sometimes helps. Does not bother he when sleeping. Occasional nasal mucous and congestion, no real globus sensation. No hemoptysis. Can be associated with some HA. She is dealing with a lot heartburn, has been for 2 yrs, uses TUMS. Has been given abx, no change.   Chest x-ray 08/21/2018 reviewed by me, shows no infiltrates, no cardiopulmonary disease.  ROV 10/09/2018 --follow-up visit for patient with a history of RA on MTX, chronic cough.  She has significant GERD and it was felt that this was a contributor.  At her initial visit I asked her to start pantoprazole once daily, loratadine.  We tried using Tessalon for cough suppression.  She had PFTs done today which I reviewed.  This shows mixed obstruction and restriction without a significant bronchodilator response, restricted volumes, normal diffusion capacity when corrected for alveolar volume.  Today she reports that the reflux is much better, cough is 20% better. Still with nasal gtt - better on the loratadine. Still most bothersome in the am when she gets up, productive of mucous. Still happens w talking and laughing. Her husband notes that she may have snoring some apneas. ? Whether she has had some dysphagia.     Review of Systems  Constitutional: Negative for fever and unexpected weight change.  HENT: Negative for congestion, dental problem, ear pain, nosebleeds, postnasal drip, rhinorrhea, sinus pressure, sneezing, sore throat and trouble swallowing.   Eyes: Negative for redness and itching.  Respiratory: Positive for  cough. Negative for chest tightness, shortness of breath and wheezing.   Cardiovascular: Negative for palpitations and leg swelling.  Gastrointestinal: Negative for nausea and vomiting.  Genitourinary: Negative for dysuria.  Musculoskeletal: Negative for joint swelling.  Skin: Negative for rash.  Neurological: Positive for headaches.  Hematological: Does not bruise/bleed easily.  Psychiatric/Behavioral: Negative for dysphoric mood. The patient is not nervous/anxious.       Objective:   Physical Exam  Vitals:   10/09/18 1405  BP: 122/82  Pulse: 72  SpO2: 100%   Gen: Pleasant, well-nourished, in no distress,  normal affect  ENT: No lesions,  mouth clear,  oropharynx clear, no postnasal drip  Neck: No JVD, no stridor  Lungs: No use of accessory muscles, no crackles or wheezing on normal respiration, no wheeze on forced expiration  Cardiovascular: RRR, heart sounds normal, no murmur or gallops, no peripheral edema  Musculoskeletal: very subtle B PIP joint changes, no cyanosis or clubbing  Neuro: alert, awake, non focal  Skin: Warm, no lesions or rash     Assessment & Plan:  Restrictive lung disease Noted on pulmonary function testing today.  No clear evidence for ILD on chest x-ray.  I believe she needs a high-resolution CT scan of the chest to evaluate further.  She does have a history of RA, methotrexate use.  Consider respiratory muscle weakness.  No evidence of an elevated hemidiaphragm on chest x-ray.  Chronic cough Evidence for possible mild obstruction superimposed on restriction on her pulmonary function testing.  Consider occult asthma.  We will do therapeutic trial of albuterol, use as needed and see if this impacts her cough.  We will also continue her GERD regimen, loratadine.  If the cough continues and I believe she will need bronchoscopy and airway inspection, cell count for eosinophils.  Baltazar Apo, MD, PhD 10/09/2018, 2:23 PM [facility] [phone] or if no answer [phone]

## 2018-10-26 ENCOUNTER — Other Ambulatory Visit: Payer: Self-pay

## 2018-10-26 ENCOUNTER — Ambulatory Visit (INDEPENDENT_AMBULATORY_CARE_PROVIDER_SITE_OTHER)
Admission: RE | Admit: 2018-10-26 | Discharge: 2018-10-26 | Disposition: A | Discharge status: Home / Self Care | Payer: 59 | Source: Ambulatory Visit | Attending: Emergency Medicine | Admitting: Emergency Medicine

## 2018-10-26 DIAGNOSIS — J849 Interstitial pulmonary disease, unspecified: Secondary | ICD-10-CM

## 2018-10-30 ENCOUNTER — Encounter: Payer: Self-pay | Admitting: Emergency Medicine

## 2018-10-30 ENCOUNTER — Ambulatory Visit (INDEPENDENT_AMBULATORY_CARE_PROVIDER_SITE_OTHER): Payer: 59 | Admitting: Emergency Medicine

## 2018-10-30 ENCOUNTER — Other Ambulatory Visit: Payer: Self-pay

## 2018-10-30 DIAGNOSIS — J984 Other disorders of lung: Secondary | ICD-10-CM | POA: Diagnosis not present

## 2018-10-30 NOTE — Progress Notes (Signed)
Subjective:    Patient ID: [name], female    DOB: 1975/04/03, 43 y.o.   MRN: [id number]  HPI Ms. Ontko is 59, never smoker with hypothyroidism, RA on MTX and has had pneumonia before.  She was seen by ENT in January for acute parotiditis.   She began to have cough about 6 months ago, in the am produces some clear to yellow mucous. During the days she can have dry cough when talking or laughing, fumes. Water sometimes helps. Does not bother he when sleeping. Occasional nasal mucous and congestion, no real globus sensation. No hemoptysis. Can be associated with some HA. She is dealing with a lot heartburn, has been for 2 yrs, uses TUMS. Has been given abx, no change.   Chest x-ray 08/21/2018 reviewed by me, shows no infiltrates, no cardiopulmonary disease.  ROV 10/09/2018 --follow-up visit for patient with a history of RA on MTX, chronic cough.  She has significant GERD and it was felt that this was a contributor.  At her initial visit I asked her to start pantoprazole once daily, loratadine.  We tried using Tessalon for cough suppression.  She had PFTs done today which I reviewed.  This shows mixed obstruction and restriction without a significant bronchodilator response, restricted volumes, normal diffusion capacity when corrected for alveolar volume.  Today she reports that the reflux is much better, cough is 20% better. Still with nasal gtt - better on the loratadine. Still most bothersome in the am when she gets up, productive of mucous. Still happens w talking and laughing. Her husband notes that she may have snoring some apneas. ? Whether she has had some dysphagia.   ROV 10/30/2018 --43 year old woman with RA on methotrexate, followed now for chronic cough.  She had pulmonary function testing done that showed mixed obstruction and restriction without a significant bronchodilator response.  We will treat her for reflux and her cough did improve some, did not resolve.  I tried her last time  on empiric albuterol to see if she would get any benefit.  She reports that her cough is largely unchanged. She is not having dyspnea, no breakthrough GERD.  Based on the restriction on pulmonary function testing and her history of RA we obtained a CT scan of the chest 10/26/2018 that I reviewed.  This shows some mild but diffuse base-predominent groundglass change peripherally without any frank honeycombing most notable on the left.   Marella Chimes, [facility] > planning to do televisit Thurs, decrease MTX, possibly add a 2nd agent.    Review of Systems  Constitutional: Negative for fever and unexpected weight change.  HENT: Negative for congestion, dental problem, ear pain, nosebleeds, postnasal drip, rhinorrhea, sinus pressure, sneezing, sore throat and trouble swallowing.   Eyes: Negative for redness and itching.  Respiratory: Positive for cough. Negative for chest tightness, shortness of breath and wheezing.   Cardiovascular: Negative for palpitations and leg swelling.  Gastrointestinal: Negative for nausea and vomiting.  Genitourinary: Negative for dysuria.  Musculoskeletal: Negative for joint swelling.  Skin: Negative for rash.  Neurological: Positive for headaches.  Hematological: Does not bruise/bleed easily.  Psychiatric/Behavioral: Negative for dysphoric mood. The patient is not nervous/anxious.       Objective:   Physical Exam  Vitals:   10/30/18 1638  BP: 120/80  Pulse: 74  Temp: 98.7 F (37.1 C)  TempSrc: Oral  SpO2: 100%  Weight: 142 lb 6.4 oz (64.6 kg)  Height: 5\' 1"  (1.549 m)   Gen: Pleasant,  well-nourished, in no distress,  normal affect  ENT: No lesions,  mouth clear,  oropharynx clear, no postnasal drip  Neck: No JVD, no stridor  Lungs: No use of accessory muscles, no crackles or wheezing on normal respiration, no wheeze on forced expiration  Cardiovascular: RRR, heart sounds normal, no murmur or gallops, no peripheral edema  Musculoskeletal: very  subtle B PIP joint changes, no cyanosis or clubbing  Neuro: alert, awake, non focal  Skin: Warm, no lesions or rash     Assessment & Plan:  Restrictive lung disease Her CT scan of the chest shows some subtle base predominant peripheral groundglass with some possible traction bronchiectasis, no honeycombing and NSIP pattern.  Also some small nodules.  Taken together this likely reflects rheumatoid change.  I consider treating her with a short course of prednisone since her main symptom is cough to see if there is a relationship.  She is planning to follow with rheumatology this Thursday and I think it makes more sense to see how they are going to adjust her medications before treating with prednisone.  I will follow-up with her in 2 months, plan serial imaging, discuss whether we need to pursue bronchoscopy or other evaluation  Your CT scan of the chest shows some evidence of mild inflammation at the bottom of your lungs that could be consistent with rheumatoid disease.  If so then this could contribute to cough, shortness of breath, or future lung scarring. We considered possibly treating with a brief course of prednisone for its anti-inflammatory effect to see if this would change your symptoms, but since you are planning to discuss your rheumatoid disease and medications with Dr. Wallace Cullens this week I would like to hold off on new medications to see Rheumatology plans.  As we go forward we will consider further work-up of the inflammatory changes in your lungs depending on how convinced we are that they are related to rheumatoid disease.  We will follow serial imaging and may ultimately decide to perform bronchoscopy or biopsy. Follow with Dr. Delton Coombes in 2 months or sooner if you have any problems.   Levy Pupa, MD, PhD 10/30/2018, 5:20 PM [facility] [phone] or if no answer [phone]

## 2018-10-30 NOTE — Assessment & Plan Note (Signed)
Her CT scan of the chest shows some subtle base predominant peripheral groundglass with some possible traction bronchiectasis, no honeycombing and NSIP pattern.  Also some small nodules.  Taken together this likely reflects rheumatoid change.  I consider treating her with a short course of prednisone since her main symptom is cough to see if there is a relationship.  She is planning to follow with rheumatology this Thursday and I think it makes more sense to see how they are going to adjust her medications before treating with prednisone.  I will follow-up with her in 2 months, plan serial imaging, discuss whether we need to pursue bronchoscopy or other evaluation  Your CT scan of the chest shows some evidence of mild inflammation at the bottom of your lungs that could be consistent with rheumatoid disease.  If so then this could contribute to cough, shortness of breath, or future lung scarring. We considered possibly treating with a brief course of prednisone for its anti-inflammatory effect to see if this would change your symptoms, but since you are planning to discuss your rheumatoid disease and medications with Dr. Pearline Cables this week I would like to hold off on new medications to see Rheumatology plans.  As we go forward we will consider further work-up of the inflammatory changes in your lungs depending on how convinced we are that they are related to rheumatoid disease.  We will follow serial imaging and may ultimately decide to perform bronchoscopy or biopsy. Follow with Dr. Lamonte Sakai in 2 months or sooner if you have any problems.

## 2018-10-30 NOTE — Patient Instructions (Addendum)
Your CT scan of the chest shows some evidence of mild inflammation at the bottom of your lungs that could be consistent with rheumatoid disease.  If so then this could contribute to cough, shortness of breath, or future lung scarring. We considered possibly treating with a brief course of prednisone for its anti-inflammatory effect to see if this would change your symptoms, but since you are planning to discuss your rheumatoid disease and medications with [name] this week I would like to hold off on new medications to see Rheumatology plans.  As we go forward we will consider further work-up of the inflammatory changes in your lungs depending on how convinced we are that they are related to rheumatoid disease.  We will follow serial imaging and may ultimately decide to perform bronchoscopy or biopsy. Follow with [name] in 2 months or sooner if you have any problems.

## 2018-12-29 ENCOUNTER — Other Ambulatory Visit: Payer: Self-pay | Admitting: Obstetrics and Gynecology

## 2018-12-29 DIAGNOSIS — E28319 Asymptomatic premature menopause: Secondary | ICD-10-CM

## 2019-02-20 ENCOUNTER — Telehealth (INDEPENDENT_AMBULATORY_CARE_PROVIDER_SITE_OTHER): Payer: 59 | Admitting: Emergency Medicine

## 2019-02-20 ENCOUNTER — Encounter: Payer: Self-pay | Admitting: Emergency Medicine

## 2019-02-20 DIAGNOSIS — J849 Interstitial pulmonary disease, unspecified: Secondary | ICD-10-CM | POA: Insufficient documentation

## 2019-02-20 NOTE — Assessment & Plan Note (Signed)
Groundglass interstitial infiltrates noted on CT chest, base predominant and peripheral in nature without any overt honeycomb change.  Question whether this is associated with her RA.  She was treated with prednisone and her symptoms improved.  I would like to check a repeat CT chest to see if her infiltrates improved as well.  She follows with Dr. Pearline Cables and the remaining question is whether she can continue to taper off prednisone completely, stay on methotrexate 10 mg daily.  I do not have any reason to keep her on the prednisone.  We will have to see if her cough, infiltrates return after it is stopped.

## 2019-02-20 NOTE — Progress Notes (Signed)
Subjective:    Patient ID: [name], female    DOB: 04-06-1975, 43 y.o.   MRN: [id number]  HPI [name] is 89, never smoker with hypothyroidism, RA on MTX and has had pneumonia before.  She was seen by ENT in January for acute parotiditis.   She began to have cough about 6 months ago, in the am produces some clear to yellow mucous. During the days she can have dry cough when talking or laughing, fumes. Water sometimes helps. Does not bother he when sleeping. Occasional nasal mucous and congestion, no real globus sensation. No hemoptysis. Can be associated with some HA. She is dealing with a lot heartburn, has been for 2 yrs, uses TUMS. Has been given abx, no change.   Chest x-ray 08/21/2018 reviewed by me, shows no infiltrates, no cardiopulmonary disease.  ROV 10/09/2018 --follow-up visit for patient with a history of RA on MTX, chronic cough.  She has significant GERD and it was felt that this was a contributor.  At her initial visit I asked her to start pantoprazole once daily, loratadine.  We tried using Tessalon for cough suppression.  She had PFTs done today which I reviewed.  This shows mixed obstruction and restriction without a significant bronchodilator response, restricted volumes, normal diffusion capacity when corrected for alveolar volume.  Today she reports that the reflux is much better, cough is 20% better. Still with nasal gtt - better on the loratadine. Still most bothersome in the am when she gets up, productive of mucous. Still happens w talking and laughing. Her husband notes that she may have snoring some apneas. ? Whether she has had some dysphagia.   ROV 10/30/2018 --43 year old woman with RA on methotrexate, followed now for chronic cough.  She had pulmonary function testing done that showed mixed obstruction and restriction without a significant bronchodilator response.  We will treat her for reflux and her cough did improve some, did not resolve.  I tried her last time  on empiric albuterol to see if she would get any benefit.  She reports that her cough is largely unchanged. She is not having dyspnea, no breakthrough GERD.  Based on the restriction on pulmonary function testing and her history of RA we obtained a CT scan of the chest 10/26/2018 that I reviewed.  This shows some mild but diffuse base-predominent groundglass change peripherally without any frank honeycombing most notable on the left.   Marella Chimes,  Rheum. > planning to do televisit Thurs, decrease MTX, possibly add a 2nd agent.   Video visit 02/20/2019 --43 year old woman with rheumatoid arthritis on methotrexate, mixed restriction and obstruction on pulmonary function testing and mild diffuse groundglass change peripherally on CT scan of the chest.  I saw her originally for chronic cough in the setting of these issues and also GERD. She has been followed by Dr Pearline Cables with Rheumatology. She was treated with prednisone 60mg , tapering down now on 5mg  a day. Remains on MTX 10mg  once a week. Her cough has completely resolved. Breathing well.    Review of Systems  Constitutional: Negative for fever and unexpected weight change.  HENT: Negative for congestion, dental problem, ear pain, nosebleeds, postnasal drip, rhinorrhea, sinus pressure, sneezing, sore throat and trouble swallowing.   Eyes: Negative for redness and itching.  Respiratory: Positive for cough. Negative for chest tightness, shortness of breath and wheezing.   Cardiovascular: Negative for palpitations and leg swelling.  Gastrointestinal: Negative for nausea and vomiting.  Genitourinary: Negative for dysuria.  Musculoskeletal: Negative for joint swelling.  Skin: Negative for rash.  Neurological: Positive for headaches.  Hematological: Does not bruise/bleed easily.  Psychiatric/Behavioral: Negative for dysphoric mood. The patient is not nervous/anxious.       Objective:       Assessment & Plan:  ILD (interstitial lung disease)  (HCC) Groundglass interstitial infiltrates noted on CT chest, base predominant and peripheral in nature without any overt honeycomb change.  Question whether this is associated with her RA.  She was treated with prednisone and her symptoms improved.  I would like to check a repeat CT chest to see if her infiltrates improved as well.  She follows with Dr. Wallace Cullens and the remaining question is whether she can continue to taper off prednisone completely, stay on methotrexate 10 mg daily.  I do not have any reason to keep her on the prednisone.  We will have to see if her cough, infiltrates return after it is stopped.  Joanne Pupa, MD, PhD 02/20/2019, 4:47 PM [facility] [phone] or if no answer [phone]

## 2019-03-02 ENCOUNTER — Telehealth: Payer: Self-pay | Admitting: Emergency Medicine

## 2019-03-02 DIAGNOSIS — J849 Interstitial pulmonary disease, unspecified: Secondary | ICD-10-CM

## 2019-03-02 NOTE — Telephone Encounter (Signed)
  Pt is calling to see if the order was placed for her to have a CT scan of her chest. I didn't see an order in there. RB did you want a CT chest without contrast? Please advise.  Assessment & Plan Note by Collene Gobble, MD at 02/20/2019 4:46 PM Author: Collene Gobble, MD Author Type: Physician Filed: 02/20/2019 4:47 PM  Note Status: Written Cosign: Cosign Not Required Encounter Date: 02/20/2019  Problem: ILD (interstitial lung disease) [facility])  Editor: Collene Gobble, MD (Physician)    Groundglass interstitial infiltrates noted on CT chest, base predominant and peripheral in nature without any overt honeycomb change.  Question whether this is associated with her RA.  She was treated with prednisone and her symptoms improved.  I would like to check a repeat CT chest to see if her infiltrates improved as well.  She follows with Dr. Pearline Cables and the remaining question is whether she can continue to taper off prednisone completely, stay on methotrexate 10 mg daily.  I do not have any reason to keep her on the prednisone.  We will have to see if her cough, infiltrates return after it is stopped

## 2019-03-02 NOTE — Progress Notes (Signed)
Virtual Visit via Video Note  I connected with Brumley on 03/07/19 at  8:15 AM EST by a video enabled telemedicine application and verified that I am speaking with the correct person using two identifiers.  Location: Patient: Home  Provider: Clinic  This service was conducted via virtual visit.  Both audio and visual tools were used.  The patient was located at home. I was located in my office.  Consent was obtained prior to the virtual visit and is aware of possible charges through their insurance for this visit.  The patient is an established patient.  Dr. Estanislado Pandy, MD conducted the virtual visit .  Office staff helped with scheduling follow up visits after the service was conducted.   I discussed the limitations of evaluation and management by telemedicine and the availability of in person appointments. The patient expressed understanding and agreed to proceed.  CC: History of Present Illness: Patient is a 43 year old female with past medical history of rheumatoid arthritis.  Seen in consultation per request of her PCP.  According to patient her symptoms started about 1 year ago with joint pain and swelling.  At the time she was seen by Marella Chimes at [facility] rheumatology.  She states she was started on methotrexate 4 tablets/week and the dose was gradually increased to 8 tablets/week.  Due to elevation in her LFTs the dose was again reduced to 6 tablets/week.  She states she started developing some cough and shortness of breath.  She was referred to Dr. Lamonte Sakai who did CT scan of her chest and diagnosed her with interstitial lung disease.  She was referred back to Ms. Pearline Cables.  She was placed on prednisone 60 mg for 1 week and then the dose was tapered due to increased side effects from the prednisone.  She states she has been on gradual taper for a long time and currently on 2.41m/day.  She will be tapering off prednisone over the next 2 weeks.  Besides methotrexate she is not on any other  medications.  She states now that no other medications were discussed in the past.  She has appointment coming up with Dr. BHulan Fessfor repeat CT scan of her chest.  She states Dr. BLamonte Sakaialso felt that she had some reflux which she took some medications for short time but this stopped as she did not feel she had any symptoms of reflux.  She has history of mild Raynaud's phenomenon.  There is no history of oral ulcers, nasal ulcers, malar rash, photosensitivity, sicca symptoms, lymphadenopathy.  Review of Systems  Constitutional: Negative for fever and malaise/fatigue.  Eyes: Negative for photophobia, pain, discharge and redness.  Respiratory: Positive for shortness of breath. Negative for cough and wheezing.   Cardiovascular: Negative for chest pain and palpitations.  Gastrointestinal: Negative for blood in stool, constipation and diarrhea.  Genitourinary: Negative for dysuria.  Musculoskeletal: Positive for joint pain. Negative for back pain, myalgias and neck pain.  Skin: Negative for rash.  Neurological: Negative for dizziness and headaches.  Psychiatric/Behavioral: Negative for depression. The patient is not nervous/anxious and does not have insomnia.       Observations/Objective: Physical Exam  Constitutional: She is oriented to person, place, and time and well-developed, well-nourished, and in no distress.  HENT:  Head: Normocephalic and atraumatic.  Eyes: Conjunctivae are normal.  Pulmonary/Chest: Effort normal.  Neurological: She is alert and oriented to person, place, and time.  Psychiatric: Mood, memory, affect and judgment normal.   Patient reports  morning stiffness for 5 minutes.   Patient denies nocturnal pain.  Difficulty dressing/grooming: Denies Difficulty climbing stairs: Denies Difficulty getting out of chair: Denies Difficulty using hands for taps, buttons, cutlery, and/or writing: Denies  She reports her rheumatoid arthritis on the scale of 0-10 about  2  Assessment and Plan: Diagnoses and all orders for this visit:  Rheumatoid arthritis involving multiple sites with positive rheumatoid factor ([place])- Patient states she was diagnosed with rheumatoid arthritis in 2019.  She has been on methotrexate 6 tablets/week along with folic acid.  She could not tolerate higher methotrexate dose due to elevation of LFTs.  None of the other medications were added.  She has been also on high-dose prednisone for a while but she has been tapering gradually.  She will be tapering off prednisone over the next 2 weeks.  She states she has done really well on prednisone.  None of the other medications were added.  Detailed counseling regarding rheumatoid arthritis was provided.  I also discussed different treatment options.  She will most likely require more aggressive therapy like a biologic agent.  We will discuss at the follow-up visit.  I will obtain some baseline labs today. Comments: RF+, CCP-, Previous pt at [facility] 11/2017. +ANA, Ro+, La+, RF 389.3, ESR 108, uric acid 4.1, CCP- Orders: -     ESR -     CK (Creatine Kinase) -     RF -     ANA -     CCP -     14-3-3 eta Protein  High risk medication use Comments: MTX 4 tablets by mouth once weekly and folic acid 1 mg po daily (started in 02/2018), low dose due to elevated LFTs Orders: -     CBC with diff -     CMP with GFR -     Hepatitis B core antibody, IgM -     Hepatitis B surface antigen -     Hepatitis C antibody -     HIV antibody -     QuantiFERON-TB Gold Plus -     Serum protein electrophoresis with reflex -     Immunoglobulins  Raynaud's syndrome without gangrene Patient has mild Raynaud's symptoms.  It is not bothersome.  ILD (interstitial lung disease) ([place]) Patient has been followed by Dr. Lamonte Sakai.  She was treated with prednisone for the last several months.  She will be getting a repeat CT scan.  I reviewed the CT scan which was consistent with interstitial lung  disease. Comments: Followed by pulm, treated with prednisone   Restrictive lung disease  Dysplasia of cervix, low grade (CIN 1 and 2)  H/O: pneumonia  History of hypothyroidism  History of hyperlipidemia  Thyroid goiter  Vitamin D deficiency- Patient is currently on vitamin D supplement.   Follow Up Instructions: She will follow up in next few weeks after labs are available.   I discussed the assessment and treatment plan with the patient. The patient was provided an opportunity to ask questions and all were answered. The patient agreed with the plan and demonstrated an understanding of the instructions.   The patient was advised to call back or seek an in-person evaluation if the symptoms worsen or if the condition fails to improve as anticipated.  I provided 45 minutes of non-face-to-face time during this encounter.   Bo Merino, MD

## 2019-03-05 NOTE — Telephone Encounter (Signed)
Yes, thank you I wanted her to have a High Res CT chest for ILD, preferably before end of the year

## 2019-03-05 NOTE — Telephone Encounter (Signed)
Called pt and advised message from the provider. Pt understood and verbalized understanding. Nothing further is needed.   CT ordered.  

## 2019-03-07 ENCOUNTER — Telehealth (INDEPENDENT_AMBULATORY_CARE_PROVIDER_SITE_OTHER): Payer: 59 | Admitting: Rheumatology

## 2019-03-07 ENCOUNTER — Other Ambulatory Visit: Payer: Self-pay

## 2019-03-07 DIAGNOSIS — N870 Mild cervical dysplasia: Secondary | ICD-10-CM

## 2019-03-07 DIAGNOSIS — Z79899 Other long term (current) drug therapy: Secondary | ICD-10-CM | POA: Diagnosis not present

## 2019-03-07 DIAGNOSIS — I7300 Raynaud's syndrome without gangrene: Secondary | ICD-10-CM | POA: Diagnosis not present

## 2019-03-07 DIAGNOSIS — J849 Interstitial pulmonary disease, unspecified: Secondary | ICD-10-CM

## 2019-03-07 DIAGNOSIS — Z8639 Personal history of other endocrine, nutritional and metabolic disease: Secondary | ICD-10-CM

## 2019-03-07 DIAGNOSIS — M0579 Rheumatoid arthritis with rheumatoid factor of multiple sites without organ or systems involvement: Secondary | ICD-10-CM | POA: Diagnosis not present

## 2019-03-07 DIAGNOSIS — Z8701 Personal history of pneumonia (recurrent): Secondary | ICD-10-CM

## 2019-03-07 DIAGNOSIS — E559 Vitamin D deficiency, unspecified: Secondary | ICD-10-CM

## 2019-03-07 DIAGNOSIS — E049 Nontoxic goiter, unspecified: Secondary | ICD-10-CM

## 2019-03-07 DIAGNOSIS — J984 Other disorders of lung: Secondary | ICD-10-CM

## 2019-03-11 NOTE — Progress Notes (Signed)
Please add ENA, C3 and C4.

## 2019-03-14 ENCOUNTER — Ambulatory Visit (INDEPENDENT_AMBULATORY_CARE_PROVIDER_SITE_OTHER)
Admission: RE | Admit: 2019-03-14 | Discharge: 2019-03-14 | Disposition: A | Discharge status: Home / Self Care | Payer: 59 | Source: Ambulatory Visit | Attending: Emergency Medicine | Admitting: Emergency Medicine

## 2019-03-14 ENCOUNTER — Other Ambulatory Visit: Payer: Self-pay

## 2019-03-14 DIAGNOSIS — J849 Interstitial pulmonary disease, unspecified: Secondary | ICD-10-CM

## 2019-03-14 LAB — IGG, IGA, IGM
IgG (Immunoglobin G), Serum: 1657 mg/dL — ABNORMAL HIGH (ref 600–1640)
IgM, Serum: 88 mg/dL (ref 50–300)
Immunoglobulin A: 322 mg/dL — ABNORMAL HIGH (ref 47–310)

## 2019-03-14 LAB — COMPLETE METABOLIC PANEL WITH GFR
AG Ratio: 1.3 (calc) (ref 1.0–2.5)
ALT: 11 U/L (ref 6–29)
AST: 21 U/L (ref 10–30)
Albumin: 4.2 g/dL (ref 3.6–5.1)
Alkaline phosphatase (APISO): 50 U/L (ref 31–125)
BUN: 11 mg/dL (ref 7–25)
CO2: 28 mmol/L (ref 20–32)
Calcium: 9.9 mg/dL (ref 8.6–10.2)
Chloride: 105 mmol/L (ref 98–110)
Creat: 0.78 mg/dL (ref 0.50–1.10)
GFR, Est African American: 107 mL/min/{1.73_m2} (ref >=60)
GFR, Est Non African American: 92 mL/min/{1.73_m2} (ref >=60)
Globulin: 3.2 g/dL (calc) (ref 1.9–3.7)
Glucose, Bld: 104 mg/dL — ABNORMAL HIGH (ref 65–99)
Potassium: 3.6 mmol/L (ref 3.5–5.3)
Sodium: 143 mmol/L (ref 135–146)
Total Bilirubin: 0.4 mg/dL (ref 0.2–1.2)
Total Protein: 7.4 g/dL (ref 6.1–8.1)

## 2019-03-14 LAB — CBC WITH DIFFERENTIAL/PLATELET
Absolute Monocytes: 446 cells/uL (ref 200–950)
Basophils Absolute: 33 cells/uL (ref 0–200)
Basophils Relative: 0.6 %
Eosinophils Absolute: 220 cells/uL (ref 15–500)
Eosinophils Relative: 4 %
HCT: 37.4 % (ref 35.0–45.0)
Hemoglobin: 12.3 g/dL (ref 11.7–15.5)
Lymphs Abs: 2178 cells/uL (ref 850–3900)
MCH: 28.8 pg (ref 27.0–33.0)
MCHC: 32.9 g/dL (ref 32.0–36.0)
MCV: 87.6 fL (ref 80.0–100.0)
MPV: 11.5 fL (ref 7.5–12.5)
Monocytes Relative: 8.1 %
Neutro Abs: 2624 cells/uL (ref 1500–7800)
Neutrophils Relative %: 47.7 %
Platelets: 250 10*3/uL (ref 140–400)
RBC: 4.27 10*6/uL (ref 3.80–5.10)
RDW: 13.7 % (ref 11.0–15.0)
Total Lymphocyte: 39.6 %
WBC: 5.5 10*3/uL (ref 3.8–10.8)

## 2019-03-14 LAB — ANTI-NUCLEAR AB-TITER (ANA TITER)
ANA TITER: 1:1280 {titer} — ABNORMAL HIGH
ANA Titer 1: 1:40 {titer} — ABNORMAL HIGH

## 2019-03-14 LAB — CYCLIC CITRUL PEPTIDE ANTIBODY, IGG: Cyclic Citrullin Peptide Ab: <16 UNITS

## 2019-03-14 LAB — SM AND SM/RNP ANTIBODIES
ENA SM Ab Ser-aCnc: <1 AI
SM/RNP: <1 AI

## 2019-03-14 LAB — PROTEIN ELECTROPHORESIS, SERUM, WITH REFLEX
Albumin ELP: 4.4 g/dL (ref 3.8–4.8)
Alpha 1: 0.3 g/dL (ref 0.2–0.3)
Alpha 2: 0.6 g/dL (ref 0.5–0.9)
Beta 2: 0.5 g/dL (ref 0.2–0.5)
Beta Globulin: 0.4 g/dL (ref 0.4–0.6)
Gamma Globulin: 1.5 g/dL (ref 0.8–1.7)
Total Protein: 7.8 g/dL (ref 6.1–8.1)

## 2019-03-14 LAB — QUANTIFERON-TB GOLD PLUS
Mitogen-NIL: >10 IU/mL
NIL: 0.04 IU/mL
QuantiFERON-TB Gold Plus: NEGATIVE
TB1-NIL: 0 IU/mL
TB2-NIL: 0 IU/mL

## 2019-03-14 LAB — SJOGREN'S SYNDROME ANTIBODS(SSA + SSB)
SSA (Ro) (ENA) Antibody, IgG: >8 AI — AB
SSB (La) (ENA) Antibody, IgG: <1 AI

## 2019-03-14 LAB — 14-3-3 ETA PROTEIN: 14-3-3 eta Protein: 3.2 ng/mL — ABNORMAL HIGH (ref <0.2)

## 2019-03-14 LAB — HIV ANTIBODY (ROUTINE TESTING W REFLEX): HIV 1&2 Ab, 4th Generation: NONREACTIVE

## 2019-03-14 LAB — HEPATITIS C ANTIBODY
Hepatitis C Ab: NONREACTIVE
SIGNAL TO CUT-OFF: 0.02 (ref <1.00)

## 2019-03-14 LAB — TEST AUTHORIZATION

## 2019-03-14 LAB — HEPATITIS B CORE ANTIBODY, IGM: Hep B C IgM: NONREACTIVE

## 2019-03-14 LAB — RHEUMATOID FACTOR: Rheumatoid fact SerPl-aCnc: 92 IU/mL — ABNORMAL HIGH (ref <14)

## 2019-03-14 LAB — HEPATITIS B SURFACE ANTIGEN: Hepatitis B Surface Ag: NONREACTIVE

## 2019-03-14 LAB — ANTI-SCLERODERMA ANTIBODY: Scleroderma (Scl-70) (ENA) Antibody, IgG: <1 AI

## 2019-03-14 LAB — ANTI-DNA ANTIBODY, DOUBLE-STRANDED: ds DNA Ab: 1 IU/mL

## 2019-03-14 LAB — CK: Total CK: 175 U/L — ABNORMAL HIGH (ref 29–143)

## 2019-03-14 LAB — SEDIMENTATION RATE: Sed Rate: 25 mm/h — ABNORMAL HIGH (ref 0–20)

## 2019-03-14 LAB — ANA: Anti Nuclear Antibody (ANA): POSITIVE — AB

## 2019-03-14 NOTE — Progress Notes (Signed)
Labs are consistent with rheumatoid arthritis.  I will discuss results at the follow-up visit.

## 2019-03-20 ENCOUNTER — Other Ambulatory Visit: Payer: Self-pay | Admitting: Emergency Medicine

## 2019-03-26 ENCOUNTER — Other Ambulatory Visit: Payer: Self-pay

## 2019-03-26 ENCOUNTER — Ambulatory Visit
Admission: RE | Admit: 2019-03-26 | Discharge: 2019-03-26 | Disposition: A | Discharge status: Home / Self Care | Payer: 59 | Source: Ambulatory Visit | Attending: Obstetrics and Gynecology | Admitting: Obstetrics and Gynecology

## 2019-03-26 DIAGNOSIS — E28319 Asymptomatic premature menopause: Secondary | ICD-10-CM

## 2019-03-26 NOTE — Progress Notes (Signed)
Office Visit Note  Patient: [name]             Date of Birth: 1975/04/17           MRN: [id number]             PCP: Maurice Small, MD Referring: Maurice Small, MD Visit Date: 03/27/2019 Occupation: '@GUAROCC'$ @  Subjective:  Pain in joints.   History of Present Illness: [name] is a 44 y.o. female with history of seropositive rheumatoid arthritis and interstitial lung disease.  She states that she has been on methotrexate 8 tablets initially but due to elevation of her LFTs the dose was decreased to 4 tablets a week.  She has been also diagnosed with interstitial lung disease and has been on a prednisone taper for the last 4 months.  She states she has been off prednisone for the last 1 week.  She currently does not have much joint discomfort except for in her right hip.  She denies any joint swelling.  Activities of Daily Living:  Patient reports morning stiffness for 0 minutes.   Patient Reports nocturnal pain.  Difficulty dressing/grooming: Denies Difficulty climbing stairs: Denies Difficulty getting out of chair: Reports Difficulty using hands for taps, buttons, cutlery, and/or writing: Denies  Review of Systems  Constitutional: Negative for fatigue, night sweats, weight gain and weight loss.  HENT: Negative for mouth sores, trouble swallowing, trouble swallowing, mouth dryness and nose dryness.   Eyes: Negative for pain, redness, itching, visual disturbance and dryness.  Respiratory: Negative for cough, shortness of breath, wheezing and difficulty breathing.   Cardiovascular: Negative for chest pain, palpitations, hypertension, irregular heartbeat and swelling in legs/feet.  Gastrointestinal: Negative for blood in stool, constipation and diarrhea.  Endocrine: Negative for increased urination.  Genitourinary: Negative for difficulty urinating, painful urination and vaginal dryness.  Musculoskeletal: Positive for arthralgias and joint pain. Negative for joint  swelling, myalgias, muscle weakness, morning stiffness, muscle tenderness and myalgias.  Skin: Negative for color change, rash, hair loss, skin tightness, ulcers and sensitivity to sunlight.  Allergic/Immunologic: Negative for susceptible to infections.  Neurological: Negative for dizziness, headaches, memory loss, night sweats and weakness.  Hematological: Negative for bruising/bleeding tendency and swollen glands.  Psychiatric/Behavioral: Negative for depressed mood, confusion and sleep disturbance. The patient is not nervous/anxious.     PMFS History:  Patient Active Problem List   Diagnosis Date Noted  . ILD (interstitial lung disease) ([place]) 02/20/2019  . Restrictive lung disease 10/09/2018  . Chronic cough 09/07/2018  . RA (rheumatoid arthritis) ([place]) 09/07/2018  . H/O: pneumonia   . Dysplasia of cervix, low grade (CIN 1 and 2)     Past Medical History:  Diagnosis Date  . Abnormal Pap smear   . Dysplasia of cervix, low grade (CIN 1)   . H/O: pneumonia   . Hypothyroidism   . Rheumatoid arthritis ([place])     Family History  Problem Relation Age of Onset  . Hypertension Mother   . Diabetes Mother   . Hypertension Father   . Rheum arthritis Sister   . Hypertension Brother   . Healthy Son   . Healthy Daughter    Past Surgical History:  Procedure Laterality Date  . CESAREAN SECTION    . LAPAROSCOPIC TUBAL LIGATION  02/01/2011   Procedure: LAPAROSCOPIC TUBAL LIGATION;  Surgeon: Delice Lesch, MD;  Location: [facility];  Service: Gynecology;  Laterality: Bilateral;  . LEEP     Social History   Social History  Narrative  . Not on file    There is no immunization history on file for this patient.   Objective: Vital Signs: BP (!) 138/91 (BP Location: Left Arm, Patient Position: Sitting, Cuff Size: Normal)   Pulse 80   Resp 13   Ht '5\' 1"'$  (1.549 m)   Wt 155 lb (70.3 kg)   BMI 29.29 kg/m    Physical Exam Vitals and nursing note reviewed.  Constitutional:       Appearance: She is well-developed.  HENT:     Head: Normocephalic and atraumatic.  Eyes:     Conjunctiva/sclera: Conjunctivae normal.  Cardiovascular:     Rate and Rhythm: Normal rate and regular rhythm.     Heart sounds: Normal heart sounds.  Pulmonary:     Effort: Pulmonary effort is normal.     Breath sounds: Normal breath sounds.  Abdominal:     General: Bowel sounds are normal.     Palpations: Abdomen is soft.  Musculoskeletal:     Cervical back: Normal range of motion.  Lymphadenopathy:     Cervical: No cervical adenopathy.  Skin:    General: Skin is warm and dry.     Capillary Refill: Capillary refill takes less than 2 seconds.  Neurological:     Mental Status: She is alert and oriented to person, place, and time.  Psychiatric:        Behavior: Behavior normal.      Musculoskeletal Exam: C-spine thoracic and lumbar spine with good range of motion.  Shoulder joints, elbow joints, wrist joints, MCPs PIPs and DIPs with good range of motion with no synovitis.  Hip joints, knee joints, ankles, MTPs and PIPs been good range of motion with no synovitis.  She has some tenderness over bilateral trochanteric bursa.  CDAI Exam: CDAI Score: 0.4  Patient Global: 2 mm; Provider Global: 2 mm Swollen: 0 ; Tender: 0  Joint Exam 03/27/2019   No joint exam has been documented for this visit     Investigation: No additional findings.  Imaging: CT Chest High Resolution  Result Date: 03/14/2019 CLINICAL DATA:  44 year old female with history of rheumatoid arthritis on methotrexate therapy. Additionally on prednisone. Evaluate for interstitial lung disease. EXAM: CT CHEST WITHOUT CONTRAST TECHNIQUE: Multidetector CT imaging of the chest was performed following the standard protocol without intravenous contrast. High resolution imaging of the lungs, as well as inspiratory and expiratory imaging, was performed. COMPARISON:  High-resolution chest CT 10/26/2018. FINDINGS: Cardiovascular:  Heart size is normal. There is no significant pericardial fluid, thickening or pericardial calcification. No atherosclerotic calcifications in the thoracic aorta or the coronary arteries. Aberrant right subclavian artery (normal anatomical variant) incidentally noted. Mediastinum/Nodes: No pathologically enlarged mediastinal or hilar lymph nodes. Please note that accurate exclusion of hilar adenopathy is limited on noncontrast CT scans. Esophagus is unremarkable in appearance. No axillary lymphadenopathy. Lungs/Pleura: High-resolution images again demonstrate basal predominant areas of ground-glass attenuation, septal thickening, mild thickening of the peribronchovascular interstitium and some very mild peripheral bronchiolectasis (most evident in the left lower lobe). No frank honeycombing. Findings are essentially stable compared to the recent prior study from 10/26/2018. Inspiratory and expiratory imaging is unremarkable. A few scattered tiny pulmonary nodules are noted in the lungs bilaterally, largest of which is a 5 mm nodule in the left upper lobe (axial image 79 of series 3). No other larger more suspicious appearing pulmonary nodules or masses are noted. No pleural effusions. No acute consolidative airspace disease. Upper Abdomen: Unremarkable. Musculoskeletal: There are no  aggressive appearing lytic or blastic lesions noted in the visualized portions of the skeleton. IMPRESSION: 1. The appearance of the lungs is compatible with interstitial lung disease, with a stable spectrum of findings technically classified as probable usual interstitial pneumonia (UIP) per current ATS guidelines. However, the possibility of nonspecific interstitial pneumonia is not excluded. Repeat high-resolution chest CT is recommended in 12 months to assess for temporal changes in the appearance of the lung parenchyma. 2. Tiny pulmonary nodules measuring 5 mm or less in size, stable compared to the prior study. These are  nonspecific, but statistically benign. Attention at time of repeat high-resolution chest CT is recommended to ensure continued stability. Electronically Signed   By: Vinnie Langton M.D.   On: 03/14/2019 12:56   DG BONE DENSITY (DXA)  Result Date: 03/26/2019 EXAM: DUAL X-RAY ABSORPTIOMETRY (DXA) FOR BONE MINERAL DENSITY IMPRESSION: Referring Physician:  Alanda Slim COUSINS Your patient completed a BMD test using Lunar IDXA DXA system ( analysis version: 16 ) manufactured by [employer]. Technologist: AW PATIENT: Name: Levetta, Bognar Patient ID: [id number] Birth Date: 02-07-1976 Height: 61.0 in. Sex: Female Measured: 03/26/2019 Weight: 151.8 lbs. Indications: Early Menopause (256.31), Estrogen Deficient, Hypothyroid, Levothyroxine, Methotrexate, Postmenopausal, Rheumatoid Arthritis (714.0) Fractures: None Treatments: Calcium (E943.0), Vitamin D (E933.5) ASSESSMENT: The BMD measured at Femur Neck Left is 1.041 g/cm2 with a T-score of 0.0. This patient is considered normal according to [facility]) criteria. The scan quality is good. Site Region Measured Date Measured Age YA BMD Significant CHANGE T-score DualFemur Neck Left  2/26/2019    44.9         0.0     1.041 g/cm2 AP Spine  L1-L4      2/26/2019    44.9         1.8     1.392 g/cm2 DualFemur Total Mean 2/26/2019    44.9         1.0     1.135 g/cm2 World Health Organization [facility]) criteria for post-menopausal, Caucasian Women: Normal       T-score at or above -1 SD Osteopenia   T-score between -1 and -2.5 SD Osteoporosis T-score at or below -2.5 SD RECOMMENDATION: 1. All patients should optimize calcium and vitamin D intake. 2. Consider FDA approved medical therapies in postmenopausal women and men aged 80 years and older, based on the following: a. A hip or vertebral (clinical or morphometric) fracture b. T- score < or = -2.5 at the femoral neck or spine after appropriate evaluation to exclude secondary causes c. Low bone mass  (T-score between -1.0 and -2.5 at the femoral neck or spine) and a 10 year probability of a hip fracture > or = 3% or a 10 year probability of a major osteoporosis-related fracture > or = 20% based on the [place]-adapted WHO algorithm d. Clinician judgment and/or patient preferences may indicate treatment for people with 10-year fracture probabilities above or below these levels FOLLOW-UP: Patients with diagnosis of osteoporosis or at high risk for fracture should have regular bone mineral density tests. For patients eligible for Medicare, routine testing is allowed once every 2 years. The testing frequency can be increased to one year for patients who have rapidly progressing disease, those who are receiving or discontinuing medical therapy to restore bone mass, or have additional risk factors. I have reviewed this report and agree with the above findings. [facility] Radiology Electronically Signed   By: Lowella Grip III M.D.   On: 03/26/2019 15:57   XR  Foot 2 Views Left  Result Date: 03/27/2019 Minimal first MTP, PIP and DIP narrowing was noted.  No other MTP joint narrowing was noted.  No intertarsal or tibiotalar joint space narrowing was noted.  No erosive changes were noted. 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  XR Foot 2 Views Right  Result Date: 03/27/2019 Minimal first MTP, PIP and DIP narrowing was noted.  No other MTP joint narrowing was noted.  No intertarsal or tibiotalar joint space narrowing was noted.  No erosive changes were noted. 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  XR Hand 2 View Left  Result Date: 03/27/2019 No MCP, PIP or DIP narrowing was noted.  No intercarpal radiocarpal joint space narrowing was noted.  No erosive changes were noted. Impression: Unremarkable x-ray of the hand.  XR Hand 2 View Right  Result Date: 03/27/2019 No MCP, PIP or DIP narrowing was noted.  No intercarpal radiocarpal  joint space narrowing was noted.  No erosive changes were noted. Impression: Unremarkable x-ray of the hand.   Recent Labs: Lab Results  Component Value Date   WBC 5.5 03/07/2019   HGB 12.3 03/07/2019   PLT 250 03/07/2019   NA 143 03/07/2019   K 3.6 03/07/2019   CL 105 03/07/2019   CO2 28 03/07/2019   GLUCOSE 104 (H) 03/07/2019   BUN 11 03/07/2019   CREATININE 0.78 03/07/2019   BILITOT 0.4 03/07/2019   ALKPHOS 92 07/07/2007   AST 21 03/07/2019   ALT 11 03/07/2019   PROT 7.4 03/07/2019   PROT 7.8 03/07/2019   ALBUMIN 1.8 (L) 07/07/2007   CALCIUM 9.9 03/07/2019   GFRAA 107 03/07/2019   QFTBGOLDPLUS NEGATIVE 03/07/2019  March 07, 2019 SPEP normal, TB Gold negative, immunoglobulin IgG mildly elevated, hepatitis B-, hepatitis C negative, HIV negative, CK 175 mildly elevated, ESR 25 mildly elevated ANA 1: 1280 nucleolar speckled, positive SSA, (SSB, Smith, RNP, double-stranded DNA negative, SCL 70 -), RF 92, anti-CCP negative, '14 3 3 '$ eta positive  Speciality Comments: No specialty comments available.  Procedures:  No procedures performed Allergies: Other   Assessment / Plan:     Visit Diagnoses: Rheumatoid arthritis involving multiple sites with positive rheumatoid factor (HCC) - Positive RF, +'14 3 3 '$ eta and elevated ESR.  Diagnosed in 2019 and treated with methotrexate 6 tablets/week.  Patient states she was having significant swelling prior to the prednisone taper which was given for her lung disease.  She could not tolerate higher dose of methotrexate due to elevation of LFTs.  Today she had no synovitis on the examination.  She states she came off prednisone only a week ago.  The x-ray of bilateral hands and feet were unremarkable.  As she has interstitial lung disease and cannot take higher dose of the methotrexate we discussed the option of Imuran.  Indications side effects contraindications were discussed at length.  Handout was given and consent was taken.  I will obtain TPMT  today.  If the level is normal she will start on Imuran 50 mg p.o. daily for 2 weeks and if labs are normal we will increase it to 100 mg p.o. daily.  We will check labs 2 weeks x 2 and then every 2 months until the labs are stable and then it can be monitored every 3 months.  Medication counseling:  TPMT: Pending Patient was counseled on the purpose, proper use, and adverse effects of azathioprine including risk of infection, nausea, rash,  and hair loss.  Reviewed risk of cancer after long term use.  Discussed risk of myelosupression and reviewed importance of frequent lab work to monitor blood counts.  Reviewed drug-drug interactions including contraindication with allopurinol.  Provided patient with educational materials on azathioprine and answered all questions.  Patient consented to azathioprine.  Will upload consent into the media tab.    High risk medication use - MTX 4 tablets by mouth once weekly and folic acid 1 mg po daily (started in 02/2018), low dose due to elevated LFTs - Plan: TPMT  Positive ANA (antinuclear antibody) - ANA 1: 1280 speckled, positive Ro antibody.  Patient has no symptoms of Sjogren's at this point.  She is not planning any pregnancy in the future.  She states she is postmenopausal.  I have advised her to get a baseline EKG if she has not had one which she will discuss with her PCP as positive Ro antibody can be associated with arrhythmias.  Pain in both hands -patient gives history of swelling and discomfort in her hands prior to prednisone use.  Plan: XR Hand 2 View Right, XR Hand 2 View Left.  X-rays obtained today were unremarkable.  Pain in both feet -she has off-and-on discomfort in her feet.  No synovitis was noted.  Plan: XR Foot 2 Views Right, XR Foot 2 Views Left.  Baseline x-rays were unremarkable today.  Raynaud's syndrome without gangrene-patient reports having only one episode of Raynaud's and denies any recurrent symptoms.  ILD (interstitial lung  disease) ([place]) - Patient has been followed by Dr. Lamonte Sakai.  She was treated with prednisone for the last 4 months.  She came off the prednisone last week.  I reviewed the CT scan results which are reported to be consistent with UIP.  She was also found to have some pulmonary nodules which was stable.Marland Kitchen  Restrictive lung disease-mention by Dr. Lamonte Sakai.  Dysplasia of cervix, low grade (CIN 1 and 2)  History of hypothyroidism  History of hyperlipidemia  Thyroid goiter  Vitamin D deficiency  H/O: pneumonia  Orders: Orders Placed This Encounter  Procedures  . XR Hand 2 View Right  . XR Hand 2 View Left  . XR Foot 2 Views Right  . XR Foot 2 Views Left  . TPMT   No orders of the defined types were placed in this encounter.   Face-to-face time spent with patient was 45 minutes. Greater than 50% of time was spent in counseling and coordination of care.  Follow-Up Instructions: Return in 4 weeks (on 04/24/2019) for Rheumatoid arthritis, ILD.   Bo Merino, MD  Note - This record has been created using Editor, commissioning.  Chart creation errors have been sought, but may not always  have been located. Such creation errors do not reflect on  the standard of medical care.

## 2019-03-27 ENCOUNTER — Ambulatory Visit: Payer: Self-pay

## 2019-03-27 ENCOUNTER — Encounter: Payer: Self-pay | Admitting: Rheumatology

## 2019-03-27 ENCOUNTER — Ambulatory Visit (INDEPENDENT_AMBULATORY_CARE_PROVIDER_SITE_OTHER): Payer: 59 | Admitting: Rheumatology

## 2019-03-27 VITALS — BP 138/91 | HR 80 | Resp 13 | Ht 61.0 in | Wt 155.0 lb

## 2019-03-27 DIAGNOSIS — M79641 Pain in right hand: Secondary | ICD-10-CM | POA: Diagnosis not present

## 2019-03-27 DIAGNOSIS — Z8639 Personal history of other endocrine, nutritional and metabolic disease: Secondary | ICD-10-CM

## 2019-03-27 DIAGNOSIS — Z79899 Other long term (current) drug therapy: Secondary | ICD-10-CM | POA: Diagnosis not present

## 2019-03-27 DIAGNOSIS — E559 Vitamin D deficiency, unspecified: Secondary | ICD-10-CM

## 2019-03-27 DIAGNOSIS — E049 Nontoxic goiter, unspecified: Secondary | ICD-10-CM

## 2019-03-27 DIAGNOSIS — N870 Mild cervical dysplasia: Secondary | ICD-10-CM

## 2019-03-27 DIAGNOSIS — M79642 Pain in left hand: Secondary | ICD-10-CM | POA: Diagnosis not present

## 2019-03-27 DIAGNOSIS — M79671 Pain in right foot: Secondary | ICD-10-CM | POA: Diagnosis not present

## 2019-03-27 DIAGNOSIS — M79672 Pain in left foot: Secondary | ICD-10-CM | POA: Diagnosis not present

## 2019-03-27 DIAGNOSIS — R768 Other specified abnormal immunological findings in serum: Secondary | ICD-10-CM | POA: Diagnosis not present

## 2019-03-27 DIAGNOSIS — Z8701 Personal history of pneumonia (recurrent): Secondary | ICD-10-CM

## 2019-03-27 DIAGNOSIS — J984 Other disorders of lung: Secondary | ICD-10-CM

## 2019-03-27 DIAGNOSIS — M0579 Rheumatoid arthritis with rheumatoid factor of multiple sites without organ or systems involvement: Secondary | ICD-10-CM

## 2019-03-27 DIAGNOSIS — J849 Interstitial pulmonary disease, unspecified: Secondary | ICD-10-CM

## 2019-03-27 DIAGNOSIS — I7300 Raynaud's syndrome without gangrene: Secondary | ICD-10-CM

## 2019-03-27 NOTE — Patient Instructions (Addendum)
Standing Labs We placed an order today for your standing lab work.    Please come back and get your standing labs in 2 weeks x2 and then every 2 months.   We have open lab daily Monday through Thursday from 8:30-12:30 PM and 1:30-4:30 PM and Friday from 8:30-12:30 PM and 1:30-4:00 PM at the office of Dr. Pollyann Savoy.   You may experience shorter wait times on Monday and Friday afternoons. The office is located at [address], [place], [place] [address] No appointment is necessary.   Labs are drawn by [facility].  You may receive a bill from [facility] for your lab work.  If you wish to have your labs drawn at another location, please call the office 24 hours in advance to send orders.  If you have any questions regarding directions or hours of operation,  please call [phone].   Just as a reminder please drink plenty of water prior to coming for your lab work. Thanks!     Azathioprine tablets What is this medicine? AZATHIOPRINE (ay za THYE oh preen) suppresses the immune system. It is used to prevent organ rejection after a transplant. It is also used to treat rheumatoid arthritis. This medicine may be used for other purposes; ask your health care provider or pharmacist if you have questions. COMMON BRAND NAME(S): Azasan, Imuran What should I tell my health care provider before I take this medicine? They need to know if you have any of these conditions:  infection  kidney disease  liver disease  an unusual or allergic reaction to azathioprine, other medicines, lactose, foods, dyes, or preservatives  pregnant or trying to get pregnant  breast feeding How should I use this medicine? Take this medicine by mouth with a full glass of water. Follow the directions on the prescription label. Take your medicine at regular intervals. Do not take your medicine more often than directed. Continue to take your medicine even if you feel better. Do not stop taking except on  your doctor's advice. Talk to your pediatrician regarding the use of this medicine in children. Special care may be needed. Overdosage: If you think you have taken too much of this medicine contact a poison control center or emergency room at once. NOTE: This medicine is only for you. Do not share this medicine with others. What if I miss a dose? If you miss a dose, take it as soon as you can. If it is almost time for your next dose, take only that dose. Do not take double or extra doses. What may interact with this medicine? Do not take this medicine with any of the following medications:  febuxostat  mercaptopurine This medicine may also interact with the following medications:  allopurinol  aminosalicylates like sulfasalazine, mesalamine, balsalazide, and olsalazine  leflunomide  medicines called ACE inhibitors like benazepril, captopril, enalapril, fosinopril, quinapril, lisinopril, ramipril, and trandolapril  mycophenolate  sulfamethoxazole; trimethoprim  vaccines  warfarin This list may not describe all possible interactions. Give your health care provider a list of all the medicines, herbs, non-prescription drugs, or dietary supplements you use. Also tell them if you smoke, drink alcohol, or use illegal drugs. Some items may interact with your medicine. What should I watch for while using this medicine? Visit your doctor or health care professional for regular checks on your progress. You will need frequent blood checks during the first few months you are receiving the medicine. If you get a cold or other infection while receiving this  medicine, call your doctor or health care professional. Do not treat yourself. The medicine may increase your risk of getting an infection. Women should inform their doctor if they wish to become pregnant or think they might be pregnant. There is a potential for serious side effects to an unborn child. Talk to your health care professional or  pharmacist for more information. Men may have a reduced sperm count while they are taking this medicine. Talk to your health care professional for more information. This medicine may increase your risk of getting certain kinds of cancer. Talk to your doctor about healthy lifestyle choices, important screenings, and your risk. What side effects may I notice from receiving this medicine? Side effects that you should report to your doctor or health care professional as soon as possible:  allergic reactions like skin rash, itching or hives, swelling of the face, lips, or tongue  changes in vision  confusion  fever, chills, or any other sign of infection  loss of balance or coordination  severe stomach pain  unusual bleeding, bruising  unusually weak or tired  vomiting  yellowing of the eyes or skin Side effects that usually do not require medical attention (report to your doctor or health care professional if they continue or are bothersome):  hair loss  nausea This list may not describe all possible side effects. Call your doctor for medical advice about side effects. You may report side effects to FDA at 1-800-FDA-1088. Where should I keep my medicine? Keep out of the reach of children. Store at room temperature between 15 and 25 degrees C (59 and 77 degrees F). Protect from light. Throw away any unused medicine after the expiration date. NOTE: This sheet is a summary. It may not cover all possible information. If you have questions about this medicine, talk to your doctor, pharmacist, or health care provider.  2020 Elsevier/Gold Standard (2013-06-26 12:00:31)

## 2019-04-02 ENCOUNTER — Encounter: Payer: Self-pay | Admitting: Rheumatology

## 2019-04-09 ENCOUNTER — Telehealth: Payer: Self-pay | Admitting: *Deleted

## 2019-04-09 LAB — THIOPURINE METHYLTRANSFERASE (TPMT), RBC: Thiopurine Methyltransferase, RBC: 10 nmol/hr/mL RBC — ABNORMAL LOW

## 2019-04-09 MED ORDER — AZATHIOPRINE 50 MG PO TABS: 50.0000 mg | ORAL_TABLET | Freq: Every day | ORAL | 0 refills | Status: DC

## 2019-04-09 NOTE — Progress Notes (Signed)
She is low metabolizer for TPMT.  She can take Imuran at lower dose with close monitoring.  Please start her on Imuran 50 mg p.o. daily and recheck labs in 2 weeks and then 1 month.  If her labs are stable we may be able to increase her to 75 mg p.o. today.

## 2019-04-09 NOTE — Telephone Encounter (Signed)
-----   Message from Pollyann Savoy, MD sent at 04/09/2019  2:17 PM EST ----- She is low metabolizer for TPMT.  She can take Imuran at lower dose with close monitoring.  Please start her on Imuran 50 mg p.o. daily and recheck labs in 2 weeks and then 1 month.  If her labs are stable we may be able to increase her to 75 mg p.o.  today.

## 2019-04-25 NOTE — Progress Notes (Deleted)
Office Visit Note  Patient: [name]             Date of Birth: [birth year]           MRN: [id number]             PCP: Shirlean Mylar, MD Referring: Shirlean Mylar, MD Visit Date: 05/01/2019 Occupation: @GUAROCC @  Subjective:  No chief complaint on file.   History of Present Illness: is a 44 y.o. female ***   Activities of Daily Living:  Patient reports morning stiffness for *** {minute/hour:19697}.   Patient {ACTIONS;DENIES/REPORTS:21021675::"Denies"} nocturnal pain.  Difficulty dressing/grooming: {ACTIONS;DENIES/REPORTS:21021675::"Denies"} Difficulty climbing stairs: {ACTIONS;DENIES/REPORTS:21021675::"Denies"} Difficulty getting out of chair: {ACTIONS;DENIES/REPORTS:21021675::"Denies"} Difficulty using hands for taps, buttons, cutlery, and/or writing: {ACTIONS;DENIES/REPORTS:21021675::"Denies"}  No Rheumatology ROS completed.   PMFS History:  Patient Active Problem List   Diagnosis Date Noted  . ILD (interstitial lung disease) (HCC) 02/20/2019  . Restrictive lung disease 10/09/2018  . Chronic cough 09/07/2018  . RA (rheumatoid arthritis) (HCC) 09/07/2018  . H/O: pneumonia   . Dysplasia of cervix, low grade (CIN 1 and 2)     Past Medical History:  Diagnosis Date  . Abnormal Pap smear   . Dysplasia of cervix, low grade (CIN 1)   . H/O: pneumonia   . Hypothyroidism   . Rheumatoid arthritis (HCC)     Family History  Problem Relation Age of Onset  . Hypertension Mother   . Diabetes Mother   . Hypertension Father   . Rheum arthritis Sister   . Hypertension Brother   . Healthy Son   . Healthy Daughter    Past Surgical History:  Procedure Laterality Date  . CESAREAN SECTION    . LAPAROSCOPIC TUBAL LIGATION  02/01/2011   Procedure: LAPAROSCOPIC TUBAL LIGATION;  Surgeon: 02/03/2011, MD;  Location: [facility];  Service: Gynecology;  Laterality: Bilateral;  . LEEP     Social History   Social History Narrative  . Not on file    There is  no immunization history on file for this patient.   Objective: Vital Signs: There were no vitals taken for this visit.   Physical Exam   Musculoskeletal Exam: ***  CDAI Exam: CDAI Score: -- Patient Global: --; Provider Global: -- Swollen: --; Tender: -- Joint Exam 05/01/2019   No joint exam has been documented for this visit   There is currently no information documented on the homunculus. Go to the Rheumatology activity and complete the homunculus joint exam.  Investigation: No additional findings.  Imaging: XR Foot 2 Views Left  Result Date: 03/27/2019 Minimal first MTP, PIP and DIP narrowing was noted.  No other MTP joint narrowing was noted.  No intertarsal or tibiotalar joint space narrowing was noted.  No erosive changes were noted. 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  XR Foot 2 Views Right  Result Date: 03/27/2019 Minimal first MTP, PIP and DIP narrowing was noted.  No other MTP joint narrowing was noted.  No intertarsal or tibiotalar joint space narrowing was noted.  No erosive changes were noted. 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  XR Hand 2 View Left  Result Date: 03/27/2019 No MCP, PIP or DIP narrowing was noted.  No intercarpal radiocarpal joint space narrowing was noted.  No erosive changes were noted. Impression: Unremarkable x-ray of the hand.  XR Hand 2 View Right  Result Date: 03/27/2019 No MCP, PIP or DIP narrowing was noted.  No intercarpal radiocarpal joint space narrowing was noted.  No erosive changes were noted. Impression: Unremarkable x-ray of the hand.   Recent Labs: Lab Results  Component Value Date   WBC 5.5 03/07/2019   HGB 12.3 03/07/2019   PLT 250 03/07/2019   NA 143 03/07/2019   K 3.6 03/07/2019   CL 105 03/07/2019   CO2 28 03/07/2019   GLUCOSE 104 (H) 03/07/2019   BUN 11 03/07/2019   CREATININE 0.78 03/07/2019   BILITOT 0.4 03/07/2019    ALKPHOS 92 07/07/2007   AST 21 03/07/2019   ALT 11 03/07/2019   PROT 7.4 03/07/2019   PROT 7.8 03/07/2019   ALBUMIN 1.8 (L) 07/07/2007   CALCIUM 9.9 03/07/2019   GFRAA 107 03/07/2019   QFTBGOLDPLUS NEGATIVE 03/07/2019    Speciality Comments: No specialty comments available.  Procedures:  No procedures performed Allergies: Other   Assessment / Plan:     Visit Diagnoses: No diagnosis found.  Orders: No orders of the defined types were placed in this encounter.  No orders of the defined types were placed in this encounter.   Face-to-face time spent with patient was *** minutes. Greater than 50% of time was spent in counseling and coordination of care.  Follow-Up Instructions: No follow-ups on file.   Earnestine Mealing, CMA  Note - This record has been created using Editor, commissioning.  Chart creation errors have been sought, but may not always  have been located. Such creation errors do not reflect on  the standard of medical care.

## 2019-04-26 ENCOUNTER — Encounter: Payer: Self-pay | Admitting: Emergency Medicine

## 2019-04-26 ENCOUNTER — Other Ambulatory Visit: Payer: Self-pay

## 2019-04-26 ENCOUNTER — Other Ambulatory Visit: Payer: Self-pay | Admitting: *Deleted

## 2019-04-26 ENCOUNTER — Ambulatory Visit (INDEPENDENT_AMBULATORY_CARE_PROVIDER_SITE_OTHER): Payer: 59 | Admitting: Emergency Medicine

## 2019-04-26 DIAGNOSIS — J849 Interstitial pulmonary disease, unspecified: Secondary | ICD-10-CM

## 2019-04-26 DIAGNOSIS — Z79899 Other long term (current) drug therapy: Secondary | ICD-10-CM

## 2019-04-26 DIAGNOSIS — R05 Cough: Secondary | ICD-10-CM

## 2019-04-26 DIAGNOSIS — R053 Chronic cough: Secondary | ICD-10-CM

## 2019-04-26 LAB — CBC WITH DIFFERENTIAL/PLATELET
Absolute Monocytes: 480 cells/uL (ref 200–950)
Basophils Absolute: 30 cells/uL (ref 0–200)
Basophils Relative: 0.5 %
Eosinophils Absolute: 1296 cells/uL — ABNORMAL HIGH (ref 15–500)
Eosinophils Relative: 21.6 %
HCT: 33.7 % — ABNORMAL LOW (ref 35.0–45.0)
Hemoglobin: 11 g/dL — ABNORMAL LOW (ref 11.7–15.5)
Lymphs Abs: 1518 cells/uL (ref 850–3900)
MCH: 28.5 pg (ref 27.0–33.0)
MCHC: 32.6 g/dL (ref 32.0–36.0)
MCV: 87.3 fL (ref 80.0–100.0)
MPV: 11.6 fL (ref 7.5–12.5)
Monocytes Relative: 8 %
Neutro Abs: 2676 cells/uL (ref 1500–7800)
Neutrophils Relative %: 44.6 %
Platelets: 251 10*3/uL (ref 140–400)
RBC: 3.86 10*6/uL (ref 3.80–5.10)
RDW: 13 % (ref 11.0–15.0)
Total Lymphocyte: 25.3 %
WBC: 6 10*3/uL (ref 3.8–10.8)

## 2019-04-26 LAB — COMPLETE METABOLIC PANEL WITH GFR
AG Ratio: 1.4 (calc) (ref 1.0–2.5)
ALT: 31 U/L — ABNORMAL HIGH (ref 6–29)
AST: 49 U/L — ABNORMAL HIGH (ref 10–35)
Albumin: 4.2 g/dL (ref 3.6–5.1)
Alkaline phosphatase (APISO): 56 U/L (ref 31–125)
BUN: 13 mg/dL (ref 7–25)
CO2: 29 mmol/L (ref 20–32)
Calcium: 9.3 mg/dL (ref 8.6–10.2)
Chloride: 105 mmol/L (ref 98–110)
Creat: 0.71 mg/dL (ref 0.50–1.10)
GFR, Est African American: 119 mL/min/{1.73_m2} (ref >=60)
GFR, Est Non African American: 103 mL/min/{1.73_m2} (ref >=60)
Globulin: 3.1 g/dL (calc) (ref 1.9–3.7)
Glucose, Bld: 92 mg/dL (ref 65–99)
Potassium: 3.5 mmol/L (ref 3.5–5.3)
Sodium: 142 mmol/L (ref 135–146)
Total Bilirubin: 0.5 mg/dL (ref 0.2–1.2)
Total Protein: 7.3 g/dL (ref 6.1–8.1)

## 2019-04-26 NOTE — Patient Instructions (Addendum)
Your CT scan of the chest from 02/2019 shows persistent inflammatory change that may relate to your rheumatoid arthritis or even your methotrexate use. Please continue your methotrexate and Imuran as ordered by Dr. Titus Dubin.  We will refer you to be seen in our interstitial lung disease (ILD) clinic. Start taking your loratadine 10mg  daily

## 2019-04-26 NOTE — Assessment & Plan Note (Signed)
She has subtle bibasilar groundglass changes more noticeable on the left and on the right.  There is probably some mild bronchiolectasis without overt honeycombing.  It did not get any better after a prolonged course of prednisone in addition to the methotrexate.  This could be due to [name] RA, consider also inflammatory change from the methotrexate itself.  She was to started on Imuran by rheumatology on 04/11/2019.  I would like for [name] to be seen in the ILD clinic.  They can determine the timing of repeat imaging, help determine next steps regarding the anti-inflammatory regimen or possible antifibrotic's. She may need to come off the MTX - will have to coordinate with Dr Titus Dubin

## 2019-04-26 NOTE — Progress Notes (Signed)
Subjective:    Patient ID: [name], female    DOB: [birth year], 44 y.o.   MRN: [id number]  HPI  ROV 10/30/2018 --44 year old woman with RA on methotrexate, followed now for chronic cough.  She had pulmonary function testing done that showed mixed obstruction and restriction without a significant bronchodilator response.  We will treat her for reflux and her cough did improve some, did not resolve.  I tried her last time on empiric albuterol to see if she would get any benefit.  She reports that her cough is largely unchanged. She is not having dyspnea, no breakthrough GERD.  Based on the restriction on pulmonary function testing and her history of RA we obtained a CT scan of the chest 10/26/2018 that I reviewed.  This shows some mild but diffuse base-predominent groundglass change peripherally without any frank honeycombing most notable on the left.   Marella Chimes, [facility] > planning to do televisit Thurs, decrease MTX, possibly add a 2nd agent.   Video visit 02/20/2019 --44 year old woman with rheumatoid arthritis on methotrexate, mixed restriction and obstruction on pulmonary function testing and mild diffuse groundglass change peripherally on CT scan of the chest.  I saw her originally for chronic cough in the setting of these issues and also GERD. She has been followed by Dr Pearline Cables with Rheumatology. She was treated with prednisone 60mg , tapering down now on 5mg  a day. Remains on MTX 10mg  once a week. Her cough has completely resolved. Breathing well.   ROV 04/26/2019 --[name] is 44 and has a history of rheumatoid arthritis on methotrexate, mixed restriction and obstruction on PFTs.  Have seen her for chronic cough and an abnormal CT scan of the chest with peripheral groundglass changes.  She is on methotrexate 10 mg once a week, was treated with prednisone 60 mg tapering down to off as of 03/21/19. Now working w Dr Patrecia Pour. She started Imuran on 04/11/19.    We repeated her CT scan  of the chest on 03/14/2019 and I have reviewed, this shows persistent areas of base predominant groundglass and septal thickening, mild peripheral bronchiolectasis especially in the left base.  No frank honeycombing.  Stable compared with 10/26/2018.  There are a few scattered tiny pulmonary nodules that are stable.   Review of Systems  Constitutional: Negative for fever and unexpected weight change.  HENT: Negative for congestion, dental problem, ear pain, nosebleeds, postnasal drip, rhinorrhea, sinus pressure, sneezing, sore throat and trouble swallowing.   Eyes: Negative for redness and itching.  Respiratory: Negative for cough, chest tightness, shortness of breath and wheezing.   Cardiovascular: Negative for palpitations and leg swelling.  Gastrointestinal: Negative for nausea and vomiting.  Genitourinary: Negative for dysuria.  Musculoskeletal: Negative for joint swelling.  Skin: Negative for rash.  Neurological: Negative for headaches.  Hematological: Does not bruise/bleed easily.  Psychiatric/Behavioral: Negative for dysphoric mood. The patient is not nervous/anxious.       Objective:   Vitals:   04/26/19 1525  BP: 134/88  Pulse: 80  SpO2: 100%  Weight: 156 lb (70.8 kg)  Height: 5\' 1"  (1.549 m)   Gen: Pleasant, well-nourished, in no distress,  normal affect  ENT: No lesions,  mouth clear,  oropharynx clear, no postnasal drip  Neck: No JVD, no stridor  Lungs: No use of accessory muscles, soft bibasilar insp crackles.   Cardiovascular: RRR, heart sounds normal, no murmur or gallops, no peripheral edema  Musculoskeletal: No deformities, no cyanosis or clubbing  Neuro: alert, awake,  non focal  Skin: Warm, no lesions or rash      Assessment & Plan:  Chronic cough Improved but still has some upper airway irritation and some coughing when she laughs.  She has postnasal drip and is only using her loratadine as needed.  I will have her start taking it every day.  ILD  (interstitial lung disease) (HCC) She has subtle bibasilar groundglass changes more noticeable on the left and on the right.  There is probably some mild bronchiolectasis without overt honeycombing.  It did not get any better after a prolonged course of prednisone in addition to the methotrexate.  This could be due to her RA, consider also inflammatory change from the methotrexate itself.  She was to started on Imuran by rheumatology on 04/11/2019.  I would like for her to be seen in the ILD clinic.  They can determine the timing of repeat imaging, help determine next steps regarding the anti-inflammatory regimen or possible antifibrotic's. She may need to come off the MTX - will have to coordinate with Dr Titus Dubin   Levy Pupa, MD, PhD 04/26/2019, 3:46 PM [facility] [phone] or if no answer [phone]

## 2019-04-26 NOTE — Assessment & Plan Note (Addendum)
Improved but still has some upper airway irritation and some coughing when she laughs.  She has postnasal drip and is only using her loratadine as needed.  I will have her start taking it every day.

## 2019-04-27 ENCOUNTER — Other Ambulatory Visit: Payer: Self-pay | Admitting: *Deleted

## 2019-04-27 NOTE — Progress Notes (Signed)
I called patient and discussed results.  Patient was started on Imuran 50 mg p.o. daily at the last visit.  She has not discontinued methotrexate yet.  I called patient and advised her to discontinue methotrexate.  We will check labs again in 3 weeks.  If her LFTs are better then we will increase Imuran to 100 mg p.o. daily.  The plan is to repeat labs again in 2 weeks after increased dose of Imuran and then in 2 months.

## 2019-04-29 ENCOUNTER — Encounter: Payer: Self-pay | Admitting: Rheumatology

## 2019-05-01 ENCOUNTER — Ambulatory Visit: Payer: 59 | Admitting: Rheumatology

## 2019-05-03 ENCOUNTER — Other Ambulatory Visit: Payer: Self-pay | Admitting: Rheumatology

## 2019-05-04 NOTE — Telephone Encounter (Signed)
Last Visit: 03/27/19 Next Visit: 05/22/19 Labs: 04/26/19 We will check labs again in 3 weeks. If her LFTs are better then we will increase Imuran to 100 mg p.o. daily. The plan is to repeat labs again in 2 weeks after increased dose of Imuran and then in 2 months.  Okay to refill per Dr. Corliss Skains

## 2019-05-11 ENCOUNTER — Other Ambulatory Visit: Payer: Self-pay

## 2019-05-11 DIAGNOSIS — Z79899 Other long term (current) drug therapy: Secondary | ICD-10-CM

## 2019-05-11 LAB — CBC WITH DIFFERENTIAL/PLATELET
Absolute Monocytes: 406 cells/uL (ref 200–950)
Basophils Absolute: 31 cells/uL (ref 0–200)
Basophils Relative: 0.6 %
Eosinophils Absolute: 614 cells/uL — ABNORMAL HIGH (ref 15–500)
Eosinophils Relative: 11.8 %
HCT: 34 % — ABNORMAL LOW (ref 35.0–45.0)
Hemoglobin: 11.2 g/dL — ABNORMAL LOW (ref 11.7–15.5)
Lymphs Abs: 1643 cells/uL (ref 850–3900)
MCH: 28.4 pg (ref 27.0–33.0)
MCHC: 32.9 g/dL (ref 32.0–36.0)
MCV: 86.3 fL (ref 80.0–100.0)
MPV: 11.5 fL (ref 7.5–12.5)
Monocytes Relative: 7.8 %
Neutro Abs: 2506 cells/uL (ref 1500–7800)
Neutrophils Relative %: 48.2 %
Platelets: 295 10*3/uL (ref 140–400)
RBC: 3.94 10*6/uL (ref 3.80–5.10)
RDW: 13.1 % (ref 11.0–15.0)
Total Lymphocyte: 31.6 %
WBC: 5.2 10*3/uL (ref 3.8–10.8)

## 2019-05-11 LAB — COMPLETE METABOLIC PANEL WITH GFR
AG Ratio: 1.3 (calc) (ref 1.0–2.5)
ALT: 23 U/L (ref 6–29)
AST: 48 U/L — ABNORMAL HIGH (ref 10–35)
Albumin: 4.4 g/dL (ref 3.6–5.1)
Alkaline phosphatase (APISO): 51 U/L (ref 31–125)
BUN: 13 mg/dL (ref 7–25)
CO2: 26 mmol/L (ref 20–32)
Calcium: 9.6 mg/dL (ref 8.6–10.2)
Chloride: 103 mmol/L (ref 98–110)
Creat: 0.74 mg/dL (ref 0.50–1.10)
GFR, Est African American: 113 mL/min/{1.73_m2} (ref >=60)
GFR, Est Non African American: 98 mL/min/{1.73_m2} (ref >=60)
Globulin: 3.3 g/dL (calc) (ref 1.9–3.7)
Glucose, Bld: 110 mg/dL — ABNORMAL HIGH (ref 65–99)
Potassium: 3.9 mmol/L (ref 3.5–5.3)
Sodium: 140 mmol/L (ref 135–146)
Total Bilirubin: 0.5 mg/dL (ref 0.2–1.2)
Total Protein: 7.7 g/dL (ref 6.1–8.1)

## 2019-05-13 NOTE — Progress Notes (Signed)
Mild anemia noted which is a stable.  Glucose is mildly elevated probably not fasting.  LFTs are improving.  Methotrexate was discontinued at the last visit.  Imuran was a started.  If patient has taken 2 weeks of Imuran 50 mg p.o. daily she may increase the dose to Imuran 100 mg p.o. daily.  Repeat labs in 2 weeks after increasing the dose of Imuran.  Then labs should be monitored every 2 months later on until stable.

## 2019-05-15 NOTE — Progress Notes (Signed)
Office Visit Note  Patient: [name]             Date of Birth: 05/05/1975           MRN: [id number]             PCP: Maurice Small, MD Referring: Maurice Small, MD Visit Date: 05/22/2019 Occupation: '@GUAROCC'$ @  Subjective:  Medication monitoring   History of Present Illness: [name] is a 44 y.o. female with history of seropositive rheumatoid arthritis, ILD, and raynaud's.  She is taking imuran 100 mg po daily.  She has been tolerating imuran without any side effects.  She discontinued MTX about 1 month ago due to elevated LFTs.  She has not noticed any increased joint pain or joint swelling since starting on Imuran.  She is due to update lab work today.  She experiencing stiffness and swelling in the morning for about 20 to 30 minutes.  She has occasional pain in the left knee joint but denies any joint swelling.  She denies any recent symptoms of Raynaud's.   Activities of Daily Living:  Patient reports morning stiffness for 20-30 minutes.   Patient Denies nocturnal pain.  Difficulty dressing/grooming: Denies Difficulty climbing stairs: Denies Difficulty getting out of chair: Denies Difficulty using hands for taps, buttons, cutlery, and/or writing: Denies  Review of Systems  Constitutional: Negative for fatigue.  HENT: Negative for mouth sores, mouth dryness and nose dryness.   Eyes: Negative for pain, itching, visual disturbance and dryness.  Respiratory: Negative for cough, hemoptysis, shortness of breath and difficulty breathing.   Cardiovascular: Negative for chest pain, palpitations, hypertension and swelling in legs/feet.  Gastrointestinal: Negative for blood in stool, constipation and diarrhea.  Endocrine: Negative for increased urination.  Genitourinary: Negative for difficulty urinating and painful urination.  Musculoskeletal: Positive for arthralgias, joint pain, joint swelling and morning stiffness. Negative for myalgias, muscle weakness, muscle tenderness  and myalgias.  Skin: Negative for color change, pallor, rash, hair loss, nodules/bumps, skin tightness, ulcers and sensitivity to sunlight.  Allergic/Immunologic: Negative for susceptible to infections.  Neurological: Negative for dizziness, numbness, headaches, memory loss and weakness.  Hematological: Negative for bruising/bleeding tendency and swollen glands.  Psychiatric/Behavioral: Negative for depressed mood, confusion and sleep disturbance. The patient is not nervous/anxious.     PMFS History:  Patient Active Problem List   Diagnosis Date Noted  . ILD (interstitial lung disease) ([place]) 02/20/2019  . Restrictive lung disease 10/09/2018  . Chronic cough 09/07/2018  . RA (rheumatoid arthritis) ([place]) 09/07/2018  . H/O: pneumonia   . Dysplasia of cervix, low grade (CIN 1 and 2)     Past Medical History:  Diagnosis Date  . Abnormal Pap smear   . Dysplasia of cervix, low grade (CIN 1)   . H/O: pneumonia   . Hypothyroidism   . Rheumatoid arthritis ([place])     Family History  Problem Relation Age of Onset  . Hypertension Mother   . Diabetes Mother   . Hypertension Father   . Rheum arthritis Sister   . Hypertension Brother   . Healthy Son   . Healthy Daughter    Past Surgical History:  Procedure Laterality Date  . CESAREAN SECTION    . LAPAROSCOPIC TUBAL LIGATION  02/01/2011   Procedure: LAPAROSCOPIC TUBAL LIGATION;  Surgeon: Delice Lesch, MD;  Location: [facility];  Service: Gynecology;  Laterality: Bilateral;  . LEEP     Social History   Social History Narrative  . Not on file  There is no immunization history on file for this patient.   Objective: Vital Signs: BP 130/83 (BP Location: Left Arm, Patient Position: Sitting, Cuff Size: Normal)   Pulse 81   Resp 13   Ht '5\' 1"'$  (1.549 m)   Wt 152 lb 3.2 oz (69 kg)   BMI 28.76 kg/m    Physical Exam Vitals and nursing note reviewed.  Constitutional:      Appearance: She is well-developed.  HENT:     Head:  Normocephalic and atraumatic.  Eyes:     Conjunctiva/sclera: Conjunctivae normal.  Pulmonary:     Effort: Pulmonary effort is normal.  Abdominal:     General: Bowel sounds are normal.     Palpations: Abdomen is soft.  Musculoskeletal:     Cervical back: Normal range of motion.  Lymphadenopathy:     Cervical: No cervical adenopathy.  Skin:    General: Skin is warm and dry.     Capillary Refill: Capillary refill takes less than 2 seconds.  Neurological:     Mental Status: She is alert and oriented to person, place, and time.  Psychiatric:        Behavior: Behavior normal.      Musculoskeletal Exam: C-spine, thoracic spine, lumbar spine good range of motion.  No midline spinal tenderness.  No SI joint tenderness.  Shoulder joints, elbow joints, strength, MCPs, PIPs, DIPs good range of motion no synovitis.  She has complete fist formation bilaterally.  Hip joints, knee joints and ankle joints, MTPs, PIPs and DIPs good range of motion with no synovitis.  No warmth or effusion of bilateral knee joints.  No tenderness swelling of ankle joints. Tenderness over bilateral trochanteric bursa.   CDAI Exam: CDAI Score: 0.4  Patient Global: 2 mm; Provider Global: 2 mm Swollen: 0 ; Tender: 0  Joint Exam 05/22/2019   No joint exam has been documented for this visit   There is currently no information documented on the homunculus. Go to the Rheumatology activity and complete the homunculus joint exam.  Investigation: No additional findings.  Imaging: No results found.  Recent Labs: Lab Results  Component Value Date   WBC 5.2 05/11/2019   HGB 11.2 (L) 05/11/2019   PLT 295 05/11/2019   NA 140 05/11/2019   K 3.9 05/11/2019   CL 103 05/11/2019   CO2 26 05/11/2019   GLUCOSE 110 (H) 05/11/2019   BUN 13 05/11/2019   CREATININE 0.74 05/11/2019   BILITOT 0.5 05/11/2019   ALKPHOS 92 07/07/2007   AST 48 (H) 05/11/2019   ALT 23 05/11/2019   PROT 7.7 05/11/2019   ALBUMIN 1.8 (L)  07/07/2007   CALCIUM 9.6 05/11/2019   GFRAA 113 05/11/2019   QFTBGOLDPLUS NEGATIVE 03/07/2019    Speciality Comments: No specialty comments available.  Procedures:  No procedures performed Allergies: Other   Assessment / Plan:     Visit Diagnoses: Rheumatoid arthritis involving multiple sites with positive rheumatoid factor (HCC) - Positive RF, +'14 3 3 '$ eta and elevated ESR.  Diagnosed in 2019 and treated with methotrexate 6 tablets/week: She has no synovitis on exam.  She has not had any recent rheumatoid arthritis flares.  She discontinued methotrexate at the beginning of February 2021 due to elevated LFTs.  She was started on Imuran 50 mg 1 tablet by mouth daily for 2 weeks and increase to 100 mg daily about 2 weeks ago.  She is been tolerating Imuran without any side effects.  She has not had any increased joint  pain or joint swelling since switching from methotrexate to Imuran.  She continues to experience stiffness in both hands for 20 to 30 minutes in the morning.  She has no tenderness or inflammation on exam today.  She will continue taking Imuran 100 mg daily.  We will update CBC and CMP today.  She will be establishing care with Dr. Chase Caller on 05/24/2019 for management of ILD.  She continues to have a cough intermittently.  We discussed that Imuran will be an effective treatment for both her rheumatoid process and underlying ILD.  She was advised to notify us if she develops increased joint pain or joint swelling.  She will follow-up in the office in 2 months.  High risk medication use - Imuran 100 mg po daily (started in February 2021), previously on MTX 4 tablets by mouth once weekly and folic acid 1 mg po daily (started in 02/2018-d/c February 2021 due to elevated LFTs.  CBC and CMP will be drawn today to monitor for drug toxicity.- Plan: CBC with Differential/Platelet, COMPLETE METABOLIC PANEL WITH GFR  Positive ANA (antinuclear antibody) - ANA 1: 1280 speckled, positive Ro antibody:  She has no other clinical features of autoimmune disease at this time.   Raynaud's syndrome without gangrene: She has not had any signs or symptoms of Raynaud's recently.  No digital ulcerations or signs of gangrene were noted on exam.  Good capillary refill noted.  ILD (interstitial lung disease) ([place]) - She was previously seeing Dr. Lamonte Sakai but will be establishing with Dr. Chase Caller on 05/24/2019.  She continues to have intermittent shortness of breath.  She is currently taking imuran 100 mg po daily.   Restrictive lung disease -Diagnosed by Dr. Lamonte Sakai.  She will be establishing care with Dr. Chase Caller on 05/24/19.  Other medical conditions are listed as follows:   Dysplasia of cervix, low grade (CIN 1 and 2)  History of hypothyroidism  History of hyperlipidemia  Vitamin D deficiency  Thyroid goiter  H/O: pneumonia  Orders: Orders Placed This Encounter  Procedures  . CBC with Differential/Platelet  . COMPLETE METABOLIC PANEL WITH GFR   Meds ordered this encounter  Medications  . azaTHIOprine (IMURAN) 50 MG tablet    Sig: Take 2 tablets (100 mg total) by mouth daily.    Dispense:  180 tablet    Refill:  0    Follow-Up Instructions: Return in about 2 months (around 07/22/2019) for Rheumatoid arthritis.   Ofilia Neas, PA-C   I examined and evaluated the patient with Hazel Sams PA.  Patient symptoms have improved on Imuran.  She has been tolerating Imuran well.  We will check labs today.  I would like to keep her on the current dose of Imuran and observe over the next couple of months.  If Dr. Chase Caller recommends increasing the dose based on her ILD we will change it.  The plan of care was discussed as noted above.  Bo Merino, MD  Note - This record has been created using Editor, commissioning.  Chart creation errors have been sought, but may not always  have been located. Such creation errors do not reflect on  the standard of medical care.

## 2019-05-22 ENCOUNTER — Other Ambulatory Visit: Payer: Self-pay

## 2019-05-22 ENCOUNTER — Encounter: Payer: Self-pay | Admitting: Rheumatology

## 2019-05-22 ENCOUNTER — Ambulatory Visit (INDEPENDENT_AMBULATORY_CARE_PROVIDER_SITE_OTHER): Payer: 59 | Admitting: Rheumatology

## 2019-05-22 VITALS — BP 130/83 | HR 81 | Resp 13 | Ht 61.0 in | Wt 152.2 lb

## 2019-05-22 DIAGNOSIS — M0579 Rheumatoid arthritis with rheumatoid factor of multiple sites without organ or systems involvement: Secondary | ICD-10-CM | POA: Diagnosis not present

## 2019-05-22 DIAGNOSIS — E049 Nontoxic goiter, unspecified: Secondary | ICD-10-CM

## 2019-05-22 DIAGNOSIS — I7300 Raynaud's syndrome without gangrene: Secondary | ICD-10-CM

## 2019-05-22 DIAGNOSIS — Z79899 Other long term (current) drug therapy: Secondary | ICD-10-CM

## 2019-05-22 DIAGNOSIS — E559 Vitamin D deficiency, unspecified: Secondary | ICD-10-CM

## 2019-05-22 DIAGNOSIS — Z8639 Personal history of other endocrine, nutritional and metabolic disease: Secondary | ICD-10-CM

## 2019-05-22 DIAGNOSIS — J984 Other disorders of lung: Secondary | ICD-10-CM

## 2019-05-22 DIAGNOSIS — J849 Interstitial pulmonary disease, unspecified: Secondary | ICD-10-CM

## 2019-05-22 DIAGNOSIS — R768 Other specified abnormal immunological findings in serum: Secondary | ICD-10-CM | POA: Diagnosis not present

## 2019-05-22 DIAGNOSIS — Z8701 Personal history of pneumonia (recurrent): Secondary | ICD-10-CM

## 2019-05-22 DIAGNOSIS — N870 Mild cervical dysplasia: Secondary | ICD-10-CM

## 2019-05-22 MED ORDER — AZATHIOPRINE 50 MG PO TABS: ORAL_TABLET | ORAL | 0 refills | Status: DC

## 2019-05-22 NOTE — Patient Instructions (Signed)
Iliotibial Band Syndrome Rehab Ask your health care provider which exercises are safe for you. Do exercises exactly as told by your health care provider and adjust them as directed. It is normal to feel mild stretching, pulling, tightness, or discomfort as you do these exercises. Stop right away if you feel sudden pain or your pain gets significantly worse. Do not begin these exercises until told by your health care provider. Stretching and range-of-motion exercises These exercises warm up your muscles and joints and improve the movement and flexibility of your hip and pelvis. Quadriceps stretch, prone  1. Lie on your abdomen on a firm surface, such as a bed or padded floor (prone position). 2. Bend your left / right knee and reach back to hold your ankle or pant leg. If you cannot reach your ankle or pant leg, loop a belt around your foot and grab the belt instead. 3. Gently pull your heel toward your buttocks. Your knee should not slide out to the side. You should feel a stretch in the front of your thigh and knee (quadriceps). 4. Hold this position for __________ seconds. Repeat __________ times. Complete this exercise __________ times a day. Iliotibial band stretch An iliotibial band is a strong band of muscle tissue that runs from the outer side of your hip to the outer side of your thigh and knee. 1. Lie on your side with your left / right leg in the top position. 2. Bend both of your knees and grab your left / right ankle. Stretch out your bottom arm to help you balance. 3. Slowly bring your top knee back so your thigh goes behind your trunk. 4. Slowly lower your top leg toward the floor until you feel a gentle stretch on the outside of your left / right hip and thigh. If you do not feel a stretch and your knee will not fall farther, place the heel of your other foot on top of your knee and pull your knee down toward the floor with your foot. 5. Hold this position for __________  seconds. Repeat __________ times. Complete this exercise __________ times a day. Strengthening exercises These exercises build strength and endurance in your hip and pelvis. Endurance is the ability to use your muscles for a long time, even after they get tired. Straight leg raises, side-lying This exercise strengthens the muscles that rotate the leg at the hip and move it away from your body (hip abductors). 1. Lie on your side with your left / right leg in the top position. Lie so your head, shoulder, hip, and knee line up. You may bend your bottom knee to help you balance. 2. Roll your hips slightly forward so your hips are stacked directly over each other and your left / right knee is facing forward. 3. Tense the muscles in your outer thigh and lift your top leg 4-6 inches (10-15 cm). 4. Hold this position for __________ seconds. 5. Slowly return to the starting position. Let your muscles relax completely before doing another repetition. Repeat __________ times. Complete this exercise __________ times a day. Leg raises, prone This exercise strengthens the muscles that move the hips (hip extensors). 1. Lie on your abdomen on your bed or a firm surface. You can put a pillow under your hips if that is more comfortable for your lower back. 2. Bend your left / right knee so your foot is straight up in the air. 3. Squeeze your buttocks muscles and lift your left / right thigh   off the bed. Do not let your back arch. 4. Tense your thigh muscle as hard as you can without increasing any knee pain. 5. Hold this position for __________ seconds. 6. Slowly lower your leg to the starting position and allow it to relax completely. Repeat __________ times. Complete this exercise __________ times a day. Hip hike 1. Stand sideways on a bottom step. Stand on your left / right leg with your other foot unsupported next to the step. You can hold on to the railing or wall for balance if needed. 2. Keep your knees  straight and your torso square. Then lift your left / right hip up toward the ceiling. 3. Slowly let your left / right hip lower toward the floor, past the starting position. Your foot should get closer to the floor. Do not lean or bend your knees. Repeat __________ times. Complete this exercise __________ times a day. This information is not intended to replace advice given to you by your health care provider. Make sure you discuss any questions you have with your health care provider. Document Revised: 06/22/2018 Document Reviewed: 12/21/2017 [employer] Patient Education  2020 [employer]   Hip Bursitis Rehab Ask your health care provider which exercises are safe for you. Do exercises exactly as told by your health care provider and adjust them as directed. It is normal to feel mild stretching, pulling, tightness, or discomfort as you do these exercises. Stop right away if you feel sudden pain or your pain gets worse. Do not begin these exercises until told by your health care provider. Stretching exercise This exercise warms up your muscles and joints and improves the movement and flexibility of your hip. This exercise also helps to relieve pain and stiffness. Iliotibial band stretch An iliotibial band is a strong band of muscle tissue that runs from the outer side of your hip to the outer side of your thigh and knee. 1. Lie on your side with your left / right leg in the top position. 2. Bend your left / right knee and grab your ankle. Stretch out your bottom arm to help you balance. 3. Slowly bring your knee back so your thigh is behind your body. 4. Slowly lower your knee toward the floor until you feel a gentle stretch on the outside of your left / right thigh. If you do not feel a stretch and your knee will not fall farther, place the heel of your other foot on top of your knee and pull your knee down toward the floor with your foot. 5. Hold this position for __________ seconds. 6. Slowly  return to the starting position. Repeat __________ times. Complete this exercise __________ times a day. Strengthening exercises These exercises build strength and endurance in your hip and pelvis. Endurance is the ability to use your muscles for a long time, even after they get tired. Bridge This exercise strengthens the muscles that move your thigh backward (hip extensors). 1. Lie on your back on a firm surface with your knees bent and your feet flat on the floor. 2. Tighten your buttocks muscles and lift your buttocks off the floor until your trunk is level with your thighs. ? Do not arch your back. ? You should feel the muscles working in your buttocks and the back of your thighs. If you do not feel these muscles, slide your feet 1-2 inches (2.5-5 cm) farther away from your buttocks. ? If this exercise is too easy, try doing it with your arms crossed   over your chest. 3. Hold this position for __________ seconds. 4. Slowly lower your hips to the starting position. 5. Let your muscles relax completely after each repetition. Repeat __________ times. Complete this exercise __________ times a day. Squats This exercise strengthens the muscles in front of your thigh and knee (quadriceps). 1. Stand in front of a table, with your feet and knees pointing straight ahead. You may rest your hands on the table for balance but not for support. 2. Slowly bend your knees and lower your hips like you are going to sit in a chair. ? Keep your weight over your heels, not over your toes. ? Keep your lower legs upright so they are parallel with the table legs. ? Do not let your hips go lower than your knees. ? Do not bend lower than told by your health care provider. ? If your hip pain increases, do not bend as low. 3. Hold the squat position for __________ seconds. 4. Slowly push with your legs to return to standing. Do not use your hands to pull yourself to standing. Repeat __________ times. Complete this  exercise __________ times a day. Hip hike 1. Stand sideways on a bottom step. Stand on your left / right leg with your other foot unsupported next to the step. You can hold on to the railing or wall for balance if needed. 2. Keep your knees straight and your torso square. Then lift your left / right hip up toward the ceiling. 3. Hold this position for __________ seconds. 4. Slowly let your left / right hip lower toward the floor, past the starting position. Your foot should get closer to the floor. Do not lean or bend your knees. Repeat __________ times. Complete this exercise __________ times a day. Single leg stand 1. Without shoes, stand near a railing or in a doorway. You may hold on to the railing or door frame as needed for balance. 2. Squeeze your left / right buttock muscles, then lift up your other foot. ? Do not let your left / right hip push out to the side. ? It is helpful to stand in front of a mirror for this exercise so you can watch your hip. 3. Hold this position for __________ seconds. Repeat __________ times. Complete this exercise __________ times a day. This information is not intended to replace advice given to you by your health care provider. Make sure you discuss any questions you have with your health care provider. Document Revised: 06/26/2018 Document Reviewed: 06/26/2018 [employer] Patient Education  2020 [employer]  

## 2019-05-23 ENCOUNTER — Telehealth: Payer: Self-pay | Admitting: Rheumatology

## 2019-05-23 ENCOUNTER — Encounter: Payer: Self-pay | Admitting: Rheumatology

## 2019-05-23 LAB — COMPLETE METABOLIC PANEL WITH GFR
AG Ratio: 1.2 (calc) (ref 1.0–2.5)
ALT: 30 U/L — ABNORMAL HIGH (ref 6–29)
AST: 73 U/L — ABNORMAL HIGH (ref 10–35)
Albumin: 4.3 g/dL (ref 3.6–5.1)
Alkaline phosphatase (APISO): 47 U/L (ref 31–125)
BUN: 10 mg/dL (ref 7–25)
CO2: 30 mmol/L (ref 20–32)
Calcium: 10 mg/dL (ref 8.6–10.2)
Chloride: 105 mmol/L (ref 98–110)
Creat: 0.73 mg/dL (ref 0.50–1.10)
GFR, Est African American: 115 mL/min/{1.73_m2} (ref >=60)
GFR, Est Non African American: 99 mL/min/{1.73_m2} (ref >=60)
Globulin: 3.5 g/dL (calc) (ref 1.9–3.7)
Glucose, Bld: 92 mg/dL (ref 65–99)
Potassium: 4.1 mmol/L (ref 3.5–5.3)
Sodium: 141 mmol/L (ref 135–146)
Total Bilirubin: 0.5 mg/dL (ref 0.2–1.2)
Total Protein: 7.8 g/dL (ref 6.1–8.1)

## 2019-05-23 LAB — CBC WITH DIFFERENTIAL/PLATELET
Absolute Monocytes: 350 cells/uL (ref 200–950)
Basophils Absolute: 18 cells/uL (ref 0–200)
Basophils Relative: 0.4 %
Eosinophils Absolute: 460 cells/uL (ref 15–500)
Eosinophils Relative: 10 %
HCT: 30.8 % — ABNORMAL LOW (ref 35.0–45.0)
Hemoglobin: 9.9 g/dL — ABNORMAL LOW (ref 11.7–15.5)
Lymphs Abs: 1725 cells/uL (ref 850–3900)
MCH: 28 pg (ref 27.0–33.0)
MCHC: 32.1 g/dL (ref 32.0–36.0)
MCV: 87.3 fL (ref 80.0–100.0)
MPV: 11 fL (ref 7.5–12.5)
Monocytes Relative: 7.6 %
Neutro Abs: 2047 cells/uL (ref 1500–7800)
Neutrophils Relative %: 44.5 %
Platelets: 296 10*3/uL (ref 140–400)
RBC: 3.53 10*6/uL — ABNORMAL LOW (ref 3.80–5.10)
RDW: 13.6 % (ref 11.0–15.0)
Total Lymphocyte: 37.5 %
WBC: 4.6 10*3/uL (ref 3.8–10.8)

## 2019-05-23 NOTE — Telephone Encounter (Signed)
Telephone note documentation routed to PCP, per Dr. Corliss Skains.

## 2019-05-23 NOTE — Progress Notes (Signed)
LFTs are elevated and are trending up.  She is no longer taking MTX and was switched to Imuran and is taking 100 mg po daily. Please advise patient to avoid taking tylenol, NSAIDs, and alcohol.   Please advise patient to reduce Imuran to 75 mg total daily. Please refer the patient to GI-Dr. Elnoria Howard for further evaluation.   Hgb is low-9.9.  Please notify patient and forward labs to PCP for further evaluation.

## 2019-05-23 NOTE — Telephone Encounter (Signed)
I called patient and discussed lab results with her.  Her labs show a drop in hemoglobin.  She states she donated blood last Thursday.  An elevation in LFTs was also noted.  She has been on Imuran 100 mg p.o. daily now.  She had elevation in her LFTs on methotrexate as well.  I have advised her to reduce Imuran to 75 mg p.o. daily.  I am uncertain about the etiology of elevated LFTs.  I recommended that she should be seen by gastroenterologist/hepatologist.  Patient states that she has an appointment scheduled with [facility] gastroenterologist on June 05, 2019 for a colonoscopy.  I advised her to contact the office and see if she can be seen sooner for a drop in hemoglobin and elevation in her LFTs.  She will contact the office.  I have also advised her to come in in 2 weeks for repeat CBC and CMP. Pollyann Savoy, MD

## 2019-05-24 ENCOUNTER — Ambulatory Visit (INDEPENDENT_AMBULATORY_CARE_PROVIDER_SITE_OTHER): Payer: 59 | Admitting: Internal Medicine

## 2019-05-24 ENCOUNTER — Telehealth: Payer: Self-pay | Admitting: Internal Medicine

## 2019-05-24 ENCOUNTER — Encounter: Payer: Self-pay | Admitting: Internal Medicine

## 2019-05-24 ENCOUNTER — Other Ambulatory Visit: Payer: Self-pay

## 2019-05-24 VITALS — BP 122/70 | HR 89 | Ht 61.0 in | Wt 151.2 lb

## 2019-05-24 DIAGNOSIS — Z7189 Other specified counseling: Secondary | ICD-10-CM

## 2019-05-24 DIAGNOSIS — K219 Gastro-esophageal reflux disease without esophagitis: Secondary | ICD-10-CM

## 2019-05-24 DIAGNOSIS — R05 Cough: Secondary | ICD-10-CM

## 2019-05-24 DIAGNOSIS — R059 Cough, unspecified: Secondary | ICD-10-CM

## 2019-05-24 DIAGNOSIS — J8489 Other specified interstitial pulmonary diseases: Secondary | ICD-10-CM

## 2019-05-24 DIAGNOSIS — Z7185 Encounter for immunization safety counseling: Secondary | ICD-10-CM

## 2019-05-24 DIAGNOSIS — M359 Systemic involvement of connective tissue, unspecified: Secondary | ICD-10-CM

## 2019-05-24 NOTE — Patient Instructions (Addendum)
ICD-10-CM   1. 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 Gastroesophageal reflux disease, unspecified whether esophagitis present  K21.9   3. Cough  R05   4. Vaccine counseling  Z71.89    You have interstitial lung disease otherwise call pulmonary fibrosis. This could be from rheumatoid or acid reflux of both -but I do suspect strongly that rheumatoid arthritis is playing a role We need to keep a close eye on his symptoms because of some amount of unpredictability with his disease  -And that it can get worse versus remained stable Any ongoing acid reflux can compound and make the problem worse -this in itself can make you cough  Plan -Do ILD questionnaire 05/24/2019 and given to the front desk   -Support and agree about getting Covid vaccine.  In case you get Covid you should qualify for monoclonal antibody treatment.  Therefore continue to social distance and wear a mask  -Sleep with head end of the bed elevated [best method is to elevate the head and using a breaker pillow at a 30 degree angle or even 20 degree angle]  -For now hold off any over-the-counter acid reflux tablets but we can reassess this in the future  -Spirometry and DLCO pulmonary function test in 2 months  -Continue Imuran that should help with joints and also has antifibrotic effect  Follow-up -Return in 2 months for 30-minute encounter on ILD clinic./Any other day.:  If there is progression then we can discuss antifibrotic therapy

## 2019-05-24 NOTE — Progress Notes (Addendum)
Subjective:    Patient ID: [name], female    DOB: Dec 10, 1975, 44 y.o.   MRN: [id number]  HPI  ROV 10/30/2018 --44 year old woman with RA on methotrexate, followed now for chronic cough.  She had pulmonary function testing done that showed mixed obstruction and restriction without a significant bronchodilator response.  We will treat her for reflux and her cough did improve some, did not resolve.  I tried her last time on empiric albuterol to see if she would get any benefit.  She reports that her cough is largely unchanged. She is not having dyspnea, no breakthrough GERD.  Based on the restriction on pulmonary function testing and her history of RA we obtained a CT scan of the chest 10/26/2018 that I reviewed.  This shows some mild but diffuse base-predominent groundglass change peripherally without any frank honeycombing most notable on the left.   [name], [facility]. > planning to do televisit Thurs, decrease MTX, possibly add a 2nd agent.   Video visit 02/20/2019 --44 year old woman with rheumatoid arthritis on methotrexate, mixed restriction and obstruction on pulmonary function testing and mild diffuse groundglass change peripherally on CT scan of the chest.  I saw her originally for chronic cough in the setting of these issues and also GERD. She has been followed by Dr Wallace Cullens with Rheumatology. She was treated with prednisone 60mg , tapering down now on 5mg  a day. Remains on MTX 10mg  once a week. Her cough has completely resolved. Breathing well.   ROV 04/26/2019 --[name] is 9 and has a history of rheumatoid arthritis on methotrexate, mixed restriction and obstruction on PFTs.  Have seen her for chronic cough and an abnormal CT scan of the chest with peripheral groundglass changes.  She is on methotrexate 10 mg once a week, was treated with prednisone 60 mg tapering down to off as of 03/21/19. Now working w Dr Gaynelle Adu. She started Imuran on 04/11/19.    We repeated her CT  scan of the chest on 03/14/2019 and I have reviewed, this shows persistent areas of base predominant groundglass and septal thickening, mild peripheral bronchiolectasis especially in the left base.  No frank honeycombing.  Stable compared with 10/26/2018.  There are a few scattered tiny pulmonary nodules that are stable.  OV 05/24/2019 -first visit has interstitial lung disease center.  Transfer of care from Dr. 03/16/2019 to the ILD center with Dr. 10/28/2018.  Subjective:  Patient ID: 07/24/2019, female , DOB: 28-Dec-1975 , age 1 y.o. , MRN: [name] , ADDRESS: [id number] [id number]  Rheumatologist - Dr [address]   05/24/2019 -   Chief Complaint  Patient presents with  . 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 Pt states she does become SOB with exertion and states she does still have a cough when she laughs. Pt denies any complaints of CP.     History is gained from talking to the patient and review of the chart.   HPI [id number] D [name] 44 y.o. -carries a diagnosis of rheumatoid arthritis.  Her sister also has rheumatoid arthritis.  She was diagnosed in 2019.  Initially treated with methotrexate.  Used to see [name] physician assistant at [facility] rheumatology but has switched to Dr. 2020.  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 Tommi Rumps 2 days ago on 05/22/2019.  Note is that she is on 100 mg once daily of Imuran tolerating it well without any side effects.  No increase in joint pain after starting Imuran but she continues to experience stiffness and swelling in the morning for 20 to 30 minutes.  Raynard itself was  not active.  In terms of her lung disease she has been seeing Dr. Delton Coombes since August 2020.  A CT scan in August 2020 showed left lower lobe groundglass opacity without any honeycombing.  Acid reflux which is a ongoing problem for her was considered as an etiology.  She also had cough and shortness of breath.  She was treated with 4 months of prednisone which ended early January 2021.  After that the cough is minimal.  She has occasional acid reflux.  In terms of her shortness of breath is very minimal as documented below.  Currently overall from a pulmonary standpoint she is feeling well.  Last pulmonary function testing was in July 2020 and last CT scan of the chest was in end of December 2020 and was essentially unchanged over 4 months.   She had questions about COVID-19 vaccine.  [facility] Comprehensive ILD Questionnaire 0= filled out 06/01/2019   Symptoms: Insidious onset of shortness of breath the last 1 year.  Since it started it is the same.  There is mild cough.    SYMPTOM SCALE - ILD 05/24/2019   O2 use ra  Shortness of Breath 0 -> 5 scale with 5 being worst (score 6 If unable to do)  At rest 0  Simple tasks - showers, clothes change, eating, shaving 1  Household (dishes, doing bed, laundry) 1  Shopping 0  Walking level at own pace 1  Walking up Stairs 1  Total (30-36) Dyspnea Score 4  How bad is your cough? 2  How bad is your fatigue 1  How bad is nausea 00  How bad is vomiting?  0  How bad is diarrhea? 0  How bad is anxiety? 0  How bad is depression 0     Past Medical History :  -Rheumatoid arthritis diagnosed in 2019.  Has thyroid disease hypothyroidism diagnosed October 2009.  Has history of pneumonia not otherwise specified January 1998.  History is otherwise negative   ROS: Positive for joint stiffness and pain since July 2019 leading to the diagnosis of rheumatoid arthritis subsequently.  She had Raynaud's description once in January 2020.  She is ongoing  acid reflux on and off since 2015   FAMILY HISTORY of LUNG DISEASE: Father has emphysema.  Sister has autoimmune disease.   EXPOSURE HISTORY: Denies cigarettes or marijuana or vaping or cocaine or intravenous drug use   HOME and HOBBY DETAILS : Single-family home for the last 16 years.  Age of the home is 17 years.  Does use a humidifier but no mildew in the humidifier.  There is some mildew in the shower curtains.  No mold in the [facility] duct.  No other organic antigen exposure in the house   OCCUPATIONAL HISTORY (122 questions) : Negative for organic antigen exposure.  Negative for inorganic antigen exposure   PULMONARY TOXICITY HISTORY (27 items): On methotrexate between October 2019 and February 2021.  To prednisone between September 2020 and January 2021.  On Imuran since  February 2021           Simple office walk 185 feet x  3 laps goal with forehead probe 05/24/2019   O2 used ra  Number laps completed 3  Comments about pace avg  Resting Pulse Ox/HR 100% and 89/min  Final Pulse Ox/HR 99% and 102/min  Desaturated </= 88% no  Desaturated <= 3% points no  Got Tachycardic >/= 90/min yes  Symptoms at end of test Very mild dyspnea  Miscellaneous comments x   Results for ESSENCE, MERLE "DEE DEE" (MRN [id number]) as of 05/24/2019 12:22  Ref. Range 10/09/2018 12:56  FVC-%Pred-Pre Latest Units: % 65  FEV1-Pre Latest Units: L 1.57  Results for LETZY, GULLICKSON "DEE DEE" (MRN [id number]) as of 05/24/2019 12:22  Ref. Range 10/09/2018 12:56  TLC Latest Units: L 3.07  TLC % pred Latest Units: % 63  Results for Kissick, Kaity D "DEE DEE" (MRN [id number]) as of 05/24/2019 12:22  Ref. Range 10/09/2018 12:56  DLCO unc Latest Units: ml/min/mmHg 15.54  DLCO unc % pred Latest Units: % 75    IMPRESSION: High-resolution CT chest December 2020 1. The appearance of the lungs is compatible with interstitial lung disease, with a stable spectrum of findings technically classified as  probable usual interstitial pneumonia (UIP) per current ATS guidelines. However, the possibility of nonspecific interstitial pneumonia is not excluded. Repeat high-resolution chest CT is recommended in 12 months to assess for temporal changes in the appearance of the lung parenchyma. 2. Tiny pulmonary nodules measuring 5 mm or less in size, stable compared to the prior study. These are nonspecific, but statistically benign. Attention at time of repeat high-resolution chest CT is recommended to ensure continued stability.   Electronically Signed   By: Trudie Reed M.D.   On: 03/14/2019 12:56 ROS - per HPI     has a past medical history of Abnormal Pap smear, Dysplasia of cervix, low grade (CIN 1), H/O: pneumonia, Hypothyroidism, and Rheumatoid arthritis (HCC).   reports that she has never smoked. She has never used smokeless tobacco.  Past Surgical History:  Procedure Laterality Date  . CESAREAN SECTION    . LAPAROSCOPIC TUBAL LIGATION  02/01/2011   Procedure: LAPAROSCOPIC TUBAL LIGATION;  Surgeon: Purcell Nails, MD;  Location: [facility];  Service: Gynecology;  Laterality: Bilateral;  . LEEP      Allergies  Allergen Reactions  . Other     Cats Fruit     Immunization History  Administered Date(s) Administered  . Influenza,inj,Quad PF,6+ Mos 12/29/2018    Family History  Problem Relation Age of Onset  . Hypertension Mother   . Diabetes Mother   . Hypertension Father   . Rheum arthritis Sister   . Hypertension Brother   . Healthy Son   . Healthy Daughter      Current Outpatient Medications:  .  azaTHIOprine (IMURAN) 50 MG tablet, Take 2 tablets (100 mg total) by mouth daily. (Patient taking differently: Take 75 mg by mouth daily. Take 2 tablets (100 mg total) by mouth daily.), Disp: 180 tablet, Rfl: 0 .  calcium carbonate (TUMS - DOSED IN MG ELEMENTAL CALCIUM) 500 MG chewable tablet, Chew 2 tablets by mouth as needed. , Disp: , Rfl:  .  Calcium-Vitamin  D-Vitamin K (VIACTIV CALCIUM PLUS D) 650-12.5-40 MG-MCG-MCG CHEW, daily., Disp: , Rfl:  .  folic acid (FOLVITE) 1 MG tablet, Take 1 mg by mouth daily., Disp: , Rfl:  .  levothyroxine (SYNTHROID, LEVOTHROID) 50 MCG tablet, Take  50 mcg by mouth daily.  , Disp: , Rfl:  .  loratadine (CLARITIN) 10 MG tablet, TAKE 1 TABLET BY MOUTH EVERY DAY (Patient taking differently: Take 10 mg by mouth daily. ), Disp: 90 tablet, Rfl: 1 .  Multiple Vitamin (MULTIVITAMIN WITH MINERALS) TABS tablet, Take 1 tablet by mouth daily., Disp: , Rfl:  .  Turmeric 1053 MG TABS, daily., Disp: , Rfl:       Objective:   Vitals:   05/24/19 1130  BP: 122/70  Pulse: 89  SpO2: 100%  Weight: 151 lb 3.2 oz (68.6 kg)  Height: 5\' 1"  (1.549 m)    Estimated body mass index is 28.57 kg/m as calculated from the following:   Height as of this encounter: 5\' 1"  (1.549 m).   Weight as of this encounter: 151 lb 3.2 oz (68.6 kg).  @WEIGHTCHANGE @  [employer]   05/24/19 1130  Weight: 151 lb 3.2 oz (68.6 kg)     Physical Exam  General Appearance:    Alert, cooperative, no distress, appears stated age - yes , Deconditioned looking - no , OBESE  - no, Sitting on Wheelchair -  no  Head:    Normocephalic, without obvious abnormality, atraumatic  Eyes:    PERRL, conjunctiva/corneas clear,  Ears:    Normal TM's and external ear canals, both ears  Nose:   Nares normal, septum midline, mucosa normal, no drainage    or sinus tenderness. OXYGEN ON  - no . Patient is @ ra   Throat:   Lips, mucosa, and tongue normal; teeth and gums normal. Cyanosis on lips - no  Neck:   Supple, symmetrical, trachea midline, no adenopathy;    thyroid:  no enlargement/tenderness/nodules; no carotid   bruit or JVD  Back:     Symmetric, no curvature, ROM normal, no CVA tenderness  Lungs:     Distress - no , Wheeze no, Barrell Chest - no, Purse lip breathing - no, Crackles - no   Chest Wall:    No tenderness or deformity.    Heart:    Regular rate and  rhythm, S1 and S2 normal, no rub   or gallop, Murmur - no  Breast Exam:    NOT DONE  Abdomen:     Soft, non-tender, bowel sounds active all four quadrants,    no masses, no organomegaly. Visceral obesity - no  Genitalia:   NOT DONE  Rectal:   NOT DONE  Extremities:   Extremities - normal, Has Cane - no, Clubbing - no, Edema - no  Pulses:   2+ and symmetric all extremities  Skin:   Stigmata of Connective Tissue Disease - STIGMATA of CONNECTIVE TISSUE DISEASE  - Distal digital fissuring (ie, "mechanic hands") - no - Distal digital tip ulceration - no -Inflammatory arthritis or polyarticular morning joint stiffness ?60 minutes - YES - Palmar telangiectasia - no - Raynaud phenomenon - no - Unexplained digital edema - no - Unexplained fixed rash on the digital extensor surfaces (Gottron's sign) - no ... - Deformities of RA - MILD YES - Scleroderma  - no - Malar Rash -  no   Lymph nodes:   Cervical, supraclavicular, and axillary nodes normal  Psychiatric:  Neurologic:   Pleasant - yes, Anxious - no, Flat affect - no  CAm-ICU - neg, Alert and Oriented x 3 - yes, Moves all 4s - yes, Speech - normal, Cognition - intact           Assessment:  ICD-10-CM   1. Interstitial lung disease due to connective tissue disease (HCC)  J84.89 Pulmonary function test   M35.9   2. Gastroesophageal reflux disease, unspecified whether esophagitis present  K21.9   3. Cough  R05   4. Vaccine counseling  Z71.89        Plan:     Patient Instructions     ICD-10-CM   1. 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 Gastroesophageal reflux disease, unspecified whether esophagitis present  K21.9   3. Cough  R05   4. Vaccine counseling  Z71.89    You have interstitial lung disease otherwise call pulmonary fibrosis. This could be from rheumatoid or acid reflux of both -but I do suspect strongly that rheumatoid arthritis is playing a role We need to  keep a close eye on his symptoms because of some amount of unpredictability with his disease  -And that it can get worse versus remained stable Any ongoing acid reflux can compound and make the problem worse -this in itself can make you cough  Plan -Do ILD questionnaire 05/24/2019 and given to the front desk   -Support and agree about getting Covid vaccine.  In case you get Covid you should qualify for monoclonal antibody treatment.  Therefore continue to social distance and wear a mask  -Sleep with head end of the bed elevated [best method is to elevate the head and using a breaker pillow at a 30 degree angle or even 20 degree angle]  -For now hold off any over-the-counter acid reflux tablets but we can reassess this in the future  -Spirometry and DLCO pulmonary function test in 2 months  -Continue Imuran that should help with joints and also has antifibrotic effect  Follow-up -Return in 2 months for 30-minute encounter on ILD clinic./Any other day.:  If there is progression then we can discuss antifibrotic therapy      SIGNATURE    Dr. Kalman Shan, M.D., F.C.C.P,  Pulmonary and Critical Care Medicine Staff Physician, [facility] Director - Interstitial Lung Disease  Program  Pulmonary Fibrosis [facility] at [facility] [place], [place], [address]  Pager: [phone], If no answer or between  15:00h - 7:00h: call [phone] Telephone: [phone]  6:36 PM 05/24/2019

## 2019-05-24 NOTE — Telephone Encounter (Signed)
Please get her ILD questionnaire from front desk. She should have done it today  Thanks    SIGNATURE    Dr. Kalman Shan, M.D., F.C.C.P,  Pulmonary and Critical Care Medicine Staff Physician, [facility] Director - Interstitial Lung Disease  Program  Pulmonary Fibrosis [facility] Network at [facility] [place], [place], [address]  Pager: [phone], If no answer or between  15:00h - 7:00h: call [phone] Telephone: [phone]  6:43 PM 05/24/2019

## 2019-05-25 NOTE — Telephone Encounter (Signed)
Pt was unable to stay and complete packet. Pt stated that she was going to have to take it home with her and bring back later.

## 2019-05-28 ENCOUNTER — Telehealth: Payer: Self-pay | Admitting: Internal Medicine

## 2019-05-28 NOTE — Telephone Encounter (Signed)
Will give to MR tomorrow when he is in clinic. Nothing further needed.

## 2019-05-30 NOTE — Telephone Encounter (Signed)
Dr. Marchelle Gearing,  Patient sent you this message this morning.  I believe you mentioned in our visit that the ILD can stay the same or get worse over time. Since the inflammation didn't respond to the prednisone, does that mean that my lungs have scarring, or is it possible that they could improve since I'm taking Imuran?  Message routed to Dr. Marchelle Gearing

## 2019-06-01 NOTE — Telephone Encounter (Signed)
Hard to predict. Can go away but more than likely will stay. Can discuss more at next visit

## 2019-06-05 ENCOUNTER — Other Ambulatory Visit: Payer: Self-pay | Admitting: Gastroenterology

## 2019-06-05 DIAGNOSIS — R748 Abnormal levels of other serum enzymes: Secondary | ICD-10-CM

## 2019-06-06 ENCOUNTER — Other Ambulatory Visit: Payer: Self-pay

## 2019-06-06 DIAGNOSIS — Z79899 Other long term (current) drug therapy: Secondary | ICD-10-CM

## 2019-06-07 LAB — COMPLETE METABOLIC PANEL WITH GFR
AG Ratio: 1.3 (calc) (ref 1.0–2.5)
ALT: 26 U/L (ref 6–29)
AST: 59 U/L — ABNORMAL HIGH (ref 10–35)
Albumin: 4.5 g/dL (ref 3.6–5.1)
Alkaline phosphatase (APISO): 45 U/L (ref 31–125)
BUN: 10 mg/dL (ref 7–25)
CO2: 27 mmol/L (ref 20–32)
Calcium: 9.6 mg/dL (ref 8.6–10.2)
Chloride: 105 mmol/L (ref 98–110)
Creat: 0.66 mg/dL (ref 0.50–1.10)
GFR, Est African American: 124 mL/min/{1.73_m2} (ref >=60)
GFR, Est Non African American: 107 mL/min/{1.73_m2} (ref >=60)
Globulin: 3.6 g/dL (calc) (ref 1.9–3.7)
Glucose, Bld: 100 mg/dL — ABNORMAL HIGH (ref 65–99)
Potassium: 3.7 mmol/L (ref 3.5–5.3)
Sodium: 140 mmol/L (ref 135–146)
Total Bilirubin: 0.5 mg/dL (ref 0.2–1.2)
Total Protein: 8.1 g/dL (ref 6.1–8.1)

## 2019-06-07 LAB — CBC WITH DIFFERENTIAL/PLATELET
Absolute Monocytes: 275 cells/uL (ref 200–950)
Basophils Absolute: 32 cells/uL (ref 0–200)
Basophils Relative: 0.7 %
Eosinophils Absolute: 405 cells/uL (ref 15–500)
Eosinophils Relative: 9 %
HCT: 32.1 % — ABNORMAL LOW (ref 35.0–45.0)
Hemoglobin: 10.3 g/dL — ABNORMAL LOW (ref 11.7–15.5)
Lymphs Abs: 1710 cells/uL (ref 850–3900)
MCH: 28 pg (ref 27.0–33.0)
MCHC: 32.1 g/dL (ref 32.0–36.0)
MCV: 87.2 fL (ref 80.0–100.0)
MPV: 10.6 fL (ref 7.5–12.5)
Monocytes Relative: 6.1 %
Neutro Abs: 2079 cells/uL (ref 1500–7800)
Neutrophils Relative %: 46.2 %
Platelets: 328 10*3/uL (ref 140–400)
RBC: 3.68 10*6/uL — ABNORMAL LOW (ref 3.80–5.10)
RDW: 13.6 % (ref 11.0–15.0)
Total Lymphocyte: 38 %
WBC: 4.5 10*3/uL (ref 3.8–10.8)

## 2019-06-07 NOTE — Progress Notes (Signed)
Please forward her lab results to her GI physician at [facility], Dr. Marca Ancona.  I discussed results with the patient.  She will continue on the current dose of Imuran at 75 mg p.o. daily.  She is currently not having much joint discomfort.  She will return for a follow-up visit and June.

## 2019-06-12 ENCOUNTER — Ambulatory Visit
Admission: RE | Admit: 2019-06-12 | Discharge: 2019-06-12 | Disposition: A | Discharge status: Home / Self Care | Payer: 59 | Source: Ambulatory Visit | Attending: Gastroenterology | Admitting: Gastroenterology

## 2019-06-12 DIAGNOSIS — R748 Abnormal levels of other serum enzymes: Secondary | ICD-10-CM

## 2019-07-16 ENCOUNTER — Encounter: Payer: Self-pay | Admitting: Rheumatology

## 2019-07-16 NOTE — Telephone Encounter (Signed)
She does not need to take folic acid if she is no longer taking MTX.

## 2019-07-28 ENCOUNTER — Other Ambulatory Visit (HOSPITAL_COMMUNITY)
Admission: RE | Admit: 2019-07-28 | Discharge: 2019-07-28 | Disposition: A | Discharge status: Home / Self Care | Payer: 59 | Source: Ambulatory Visit | Attending: Internal Medicine | Admitting: Internal Medicine

## 2019-07-28 DIAGNOSIS — Z20822 Contact with and (suspected) exposure to covid-19: Secondary | ICD-10-CM | POA: Insufficient documentation

## 2019-07-28 DIAGNOSIS — Z01812 Encounter for preprocedural laboratory examination: Secondary | ICD-10-CM | POA: Insufficient documentation

## 2019-07-28 LAB — SARS CORONAVIRUS 2 (TAT 6-24 HRS): SARS Coronavirus 2: NEGATIVE

## 2019-07-31 ENCOUNTER — Ambulatory Visit (INDEPENDENT_AMBULATORY_CARE_PROVIDER_SITE_OTHER): Payer: 59 | Admitting: Internal Medicine

## 2019-07-31 ENCOUNTER — Encounter: Payer: Self-pay | Admitting: Internal Medicine

## 2019-07-31 ENCOUNTER — Other Ambulatory Visit: Payer: Self-pay

## 2019-07-31 VITALS — BP 116/86 | HR 71 | Temp 98.2°F | Ht 63.0 in | Wt 150.1 lb

## 2019-07-31 DIAGNOSIS — J8489 Other specified interstitial pulmonary diseases: Secondary | ICD-10-CM | POA: Diagnosis not present

## 2019-07-31 DIAGNOSIS — M359 Systemic involvement of connective tissue, unspecified: Secondary | ICD-10-CM | POA: Diagnosis not present

## 2019-07-31 DIAGNOSIS — R059 Cough, unspecified: Secondary | ICD-10-CM

## 2019-07-31 DIAGNOSIS — M0579 Rheumatoid arthritis with rheumatoid factor of multiple sites without organ or systems involvement: Secondary | ICD-10-CM | POA: Diagnosis not present

## 2019-07-31 DIAGNOSIS — R05 Cough: Secondary | ICD-10-CM

## 2019-07-31 LAB — PULMONARY FUNCTION TEST
DL/VA % pred: 113 %
DL/VA: 4.98 ml/min/mmHg/L
DLCO cor % pred: 62 %
DLCO cor: 13 ml/min/mmHg
DLCO unc % pred: 62 %
DLCO unc: 13 ml/min/mmHg
FEF 25-75 Pre: 2.3 L/sec
FEF2575-%Pred-Pre: 89 %
FEV1-%Pred-Pre: 64 %
FEV1-Pre: 1.52 L
FEV1FVC-%Pred-Pre: 107 %
FEV6-%Pred-Pre: 60 %
FEV6-Pre: 1.71 L
FEV6FVC-%Pred-Pre: 102 %
FVC-%Pred-Pre: 59 %
FVC-Pre: 1.71 L
Pre FEV1/FVC ratio: 89 %
Pre FEV6/FVC Ratio: 100 %

## 2019-07-31 NOTE — Patient Instructions (Addendum)
ICD-10-CM   1. 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 Gastroesophageal reflux disease, unspecified whether esophagitis present  K21.9   3. Cough  R05   4. Vaccine counseling  Z71.89    You have interstitial lung disease otherwise call pulmonary fibrosis. This could be from rheumatoid or acid reflux of both -but suspect strongly that rheumatoid arthritis is playing a role Currently PFT worse than a year ago but symptoms, walk test and CT Scan is stable  - so overall I think you are stable  - immuran is probably helping lungs  - hold off anti-fibrotic therapy for  now  Plan Control acid reflux  -Sleep with head end of the bed elevated [best method is to elevate the head and using a breaker pillow at a 30 degree angle or even 20 degree angle]  - can do OTC prilosec 20mg  daily in empty stomach  -Spirometry and DLCO pulmonary function test in 4 months  -Continue Imuran that should help with joints and also has antifibrotic effect  Follow-up -Return in42 months for 30-minute encounter on ILD clinic./Any other day.:   -  If there is progression then we can discuss antifibrotic therapy

## 2019-07-31 NOTE — Progress Notes (Signed)
Spirometry and DLCO performed today. °

## 2019-07-31 NOTE — Progress Notes (Signed)
Subjective:    Patient ID: [name], female    DOB: Dec 10, 1975, 44 y.o.   MRN: [id number]  HPI  ROV 10/30/2018 --44 year old woman with RA on methotrexate, followed now for chronic cough.  She had pulmonary function testing done that showed mixed obstruction and restriction without a significant bronchodilator response.  We will treat her for reflux and her cough did improve some, did not resolve.  I tried her last time on empiric albuterol to see if she would get any benefit.  She reports that her cough is largely unchanged. She is not having dyspnea, no breakthrough GERD.  Based on the restriction on pulmonary function testing and her history of RA we obtained a CT scan of the chest 10/26/2018 that I reviewed.  This shows some mild but diffuse base-predominent groundglass change peripherally without any frank honeycombing most notable on the left.   [name], [facility]. > planning to do televisit Thurs, decrease MTX, possibly add a 2nd agent.   Video visit 02/20/2019 --44 year old woman with rheumatoid arthritis on methotrexate, mixed restriction and obstruction on pulmonary function testing and mild diffuse groundglass change peripherally on CT scan of the chest.  I saw her originally for chronic cough in the setting of these issues and also GERD. She has been followed by Dr Wallace Cullens with Rheumatology. She was treated with prednisone 60mg , tapering down now on 5mg  a day. Remains on MTX 10mg  once a week. Her cough has completely resolved. Breathing well.   ROV 04/26/2019 --[name] is 9 and has a history of rheumatoid arthritis on methotrexate, mixed restriction and obstruction on PFTs.  Have seen her for chronic cough and an abnormal CT scan of the chest with peripheral groundglass changes.  She is on methotrexate 10 mg once a week, was treated with prednisone 60 mg tapering down to off as of 03/21/19. Now working w Dr Gaynelle Adu. She started Imuran on 04/11/19.    We repeated her CT  scan of the chest on 03/14/2019 and I have reviewed, this shows persistent areas of base predominant groundglass and septal thickening, mild peripheral bronchiolectasis especially in the left base.  No frank honeycombing.  Stable compared with 10/26/2018.  There are a few scattered tiny pulmonary nodules that are stable.  OV 05/24/2019 -first visit has interstitial lung disease center.  Transfer of care from Dr. 03/16/2019 to the ILD center with Dr. 10/28/2018.  Subjective:  Patient ID: 07/24/2019, female , DOB: 28-Dec-1975 , age 1 y.o. , MRN: [name] , ADDRESS: [address] [id number]  Rheumatologist - Dr [address]   05/24/2019 -   Chief Complaint  Patient presents with  . 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 Pt states she does become SOB with exertion and states she does still have a cough when she laughs. Pt denies any complaints of CP.     History is gained from talking to the patient and review of the chart.   HPI [id number] [name] 44 y.o. -carries a diagnosis of rheumatoid arthritis.  Her sister also has rheumatoid arthritis.  She was diagnosed in 2019.  Initially treated with methotrexate.  Used to see Ms. Joanne physician assistant at [facility] rheumatology but has switched to Dr. 2020.  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 Tommi Rumps 2 days ago on 05/22/2019.  Note is that she is on 100 mg once daily of Imuran tolerating it well without any side effects.  No increase in joint pain after starting Imuran but she continues to experience stiffness and swelling in the morning for 20 to 30 minutes.  Raynard itself was  not active.  In terms of her lung disease she has been seeing Dr. Delton Coombes since August 2020.  A CT scan in August 2020 showed left lower lobe groundglass opacity without any honeycombing.  Acid reflux which is a ongoing problem for her was considered as an etiology.  She also had cough and shortness of breath.  She was treated with 4 months of prednisone which ended early January 2021.  After that the cough is minimal.  She has occasional acid reflux.  In terms of her shortness of breath is very minimal as documented below.  Currently overall from a pulmonary standpoint she is feeling well.  Last pulmonary function testing was in July 2020 and last CT scan of the chest was in end of December 2020 and was essentially unchanged over 4 months.   She had questions about COVID-19 vaccine.  [facility] Comprehensive ILD Questionnaire 0= filled out 06/01/2019   Symptoms: Insidious onset of shortness of breath the last 1 year.  Since it started it is the same.  There is mild cough.    Past Medical History :  -Rheumatoid arthritis diagnosed in 2019.  Has thyroid disease hypothyroidism diagnosed October 2009.  Has history of pneumonia not otherwise specified January 1998.  History is otherwise negative   ROS: Positive for joint stiffness and pain since July 2019 leading to the diagnosis of rheumatoid arthritis subsequently.  She had Raynaud's description once in January 2020.  She is ongoing acid reflux on and off since 2015   FAMILY HISTORY of LUNG DISEASE: Father has emphysema.  Sister has autoimmune disease.   EXPOSURE HISTORY: Denies cigarettes or marijuana or vaping or cocaine or intravenous drug use   HOME and HOBBY DETAILS : Single-family home for the last 16 years.  Age of the home is 17 years.  Does use a humidifier but no mildew in the humidifier.  There is some mildew in the shower curtains.  No mold in the [facility] duct.  No other organic antigen exposure in the house   OCCUPATIONAL  HISTORY (122 questions) : Negative for organic antigen exposure.  Negative for inorganic antigen exposure   PULMONARY TOXICITY HISTORY (27 items): On methotrexate between October 2019 and February 2021.  To prednisone between September 2020 and January 2021.  On Imuran since February 2021    Results for [name], Joanne "DEE DEE" (MRN [id number]) as of 05/24/2019 12:22  Ref. Range 10/09/2018 12:56  FVC-%Pred-Pre Latest Units: % 65  FEV1-Pre Latest Units: L 1.57  Results for [name], Joanne "DEE DEE" (MRN [id number]) as of 05/24/2019 12:22  Ref. Range 10/09/2018 12:56  TLC Latest Units: L 3.07  TLC % pred Latest Units: % 63  Results for [name], Joanne Joanne "DEE DEE" (MRN [id number]) as of 05/24/2019 12:22  Ref. Range 10/09/2018 12:56  DLCO unc Latest Units: ml/min/mmHg 15.54  DLCO unc % pred Latest Units: % 75    IMPRESSION: High-resolution CT chest December 2020 Lungs/Pleura: High-resolution images again demonstrate basal predominant  areas of ground-glass attenuation, septal thickening, mild thickening of the peribronchovascular interstitium and some very mild peripheral bronchiolectasis (most evident in the left lower lobe). No frank honeycombing. Findings are essentially stable compared to the recent prior study from 10/26/2018. Inspiratory and expiratory imaging is unremarkable. A few scattered tiny pulmonary nodules are noted in the lungs bilaterally, largest of which is a 5 mm nodule in the left upper lobe (axial image 79 of series 3). No other larger more suspicious appearing pulmonary nodules or masses are noted. No pleural effusions. No acute consolidative airspace disease.  1. The appearance of the lungs is compatible with interstitial lung disease, with a stable spectrum of findings technically classified as probable usual interstitial pneumonia (UIP) per current ATS guidelines. However, the possibility of nonspecific interstitial pneumonia is not excluded. Repeat  high-resolution chest CT is recommended in 12 months to assess for temporal changes in the appearance of the lung parenchyma. 2. Tiny pulmonary nodules measuring 5 mm or less in size, stable compared to the prior study. These are nonspecific, but statistically benign. Attention at time of repeat high-resolution chest CT is recommended to ensure continued stability.   Electronically Signed   By: Trudie Reed M.Joanne.   On: 03/14/2019 12:56 ROS - per HPI   OV 07/31/2019  Subjective:  Patient ID: [name], female , DOB: [birth year] , age 3 y.o. , MRN: [id number] , ADDRESS: [address] [place] [address]   07/31/2019 -   Chief Complaint  Patient presents with  . 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 -returns for follow-up.  Since her last visit her symptoms are similar.  She reports better acid reflux control.  She had pulmonary function test that compared to a year ago shows decline documented below but she herself is feeling fine.  Her walking desaturation test is stable.  Her CT scan of the chest between August 2020 in December 2020 is stable.  She is currently on Imuran and is tolerating it well.  This is being monitored by Dr. Algis Downs.  There are no other new issues.  She reports she had echocardiogram earlier in the year and this was normal.   SYMPTOM SCALE - ILD 05/24/2019  07/31/2019   O2 use ra ra  Shortness of Breath 0 -> 5 scale with 5 being worst (score 6 If unable to do)   At rest 0 0  Simple tasks - showers, clothes change, eating, shaving 1 0  Household (dishes, doing bed, laundry) 1 0  Shopping 0 0  Walking level at own pace 1 0  Walking up Stairs 1 2  Total (30-36) Dyspnea Score 4 2  How bad is your cough? 2 1  How bad is your fatigue 1 0  How bad is nausea 00 0  How bad is vomiting?  0 0  How bad is diarrhea? 0 0  How bad is  anxiety? 0 0  How bad is depression 0 0      Simple office walk 185 feet x  3 laps goal with forehead probe 05/24/2019  07/31/2019   O2 used ra ra  Number laps completed 3 3  Comments about pace avg Mod pace  Resting Pulse Ox/HR 100% and 89/min 100% and 71  Final Pulse Ox/HR 99% and 102/min 99% and 85  Desaturated </= 88% no   Desaturated <= 3% points no  Got Tachycardic >/= 90/min yes   Symptoms at end of test Very mild dyspnea   Miscellaneous comments x     ROS - per HPI  Results for [name], Joanne "DEE DEE" (MRN [id number]) as of 07/31/2019 11:37  Ref. Range 10/09/2018 12:56 07/31/2019 09:58  FVC-Pre Latest Units: L 1.88 1.71  FVC-%Pred-Pre Latest Units: % 65 59  Results for [name], Joanne Joanne "DEE DEE" (MRN [id number]) as of 07/31/2019 11:37  Ref. Range 10/09/2018 12:56 07/31/2019 09:58  DLCO unc Latest Units: ml/min/mmHg 15.54 13.00  DLCO unc % pred Latest Units: % 75 62     has a past medical history of Abnormal Pap smear, Dysplasia of cervix, low grade (CIN 1), H/O: pneumonia, Hypothyroidism, and Rheumatoid arthritis (HCC).   reports that she has never smoked. She has never used smokeless tobacco.  Past Surgical History:  Procedure Laterality Date  . CESAREAN SECTION    . LAPAROSCOPIC TUBAL LIGATION  02/01/2011   Procedure: LAPAROSCOPIC TUBAL LIGATION;  Surgeon: Purcell Nails, MD;  Location: [facility];  Service: Gynecology;  Laterality: Bilateral;  . LEEP      Allergies  Allergen Reactions  . Other     Cats Fruit     Immunization History  Administered Date(s) Administered  . Influenza,inj,Quad PF,6+ Mos 12/29/2018  . PFIZER SARS-COV-2 Vaccination 05/25/2019, 06/15/2019    Family History  Problem Relation Age of Onset  . Hypertension Mother   . Diabetes Mother   . Hypertension Father   . Rheum arthritis Sister   . Hypertension Brother   . Healthy Son   . Healthy Daughter      Current Outpatient Medications:  .  azaTHIOprine (IMURAN) 50 MG  tablet, Take 2 tablets (100 mg total) by mouth daily. (Patient taking differently: Take 75 mg by mouth daily. Take 2 tablets (100 mg total) by mouth daily.), Disp: 180 tablet, Rfl: 0 .  calcium carbonate (TUMS - DOSED IN MG ELEMENTAL CALCIUM) 500 MG chewable tablet, Chew 2 tablets by mouth as needed. , Disp: , Rfl:  .  Calcium-Vitamin Joanne-Vitamin K (VIACTIV CALCIUM PLUS Joanne) 650-12.5-40 MG-MCG-MCG CHEW, daily., Disp: , Rfl:  .  levothyroxine (SYNTHROID, LEVOTHROID) 50 MCG tablet, Take 50 mcg by mouth daily.  , Disp: , Rfl:  .  loratadine (CLARITIN) 10 MG tablet, TAKE 1 TABLET BY MOUTH EVERY DAY (Patient taking differently: Take 10 mg by mouth daily. ), Disp: 90 tablet, Rfl: 1 .  Multiple Vitamin (MULTIVITAMIN WITH MINERALS) TABS tablet, Take 1 tablet by mouth daily., Disp: , Rfl:  .  Turmeric 1053 MG TABS, daily., Disp: , Rfl:       Objective:   Vitals:   07/31/19 1124  BP: 116/86  Pulse: 71  Temp: 98.2 F (36.8 C)  TempSrc: Temporal  SpO2: 100%  Weight: 150 lb 1.6 oz (68.1 kg)  Height: 5\' 3"  (1.6 m)    Estimated body mass index is 26.59 kg/m as calculated from the following:   Height as of this encounter: 5\' 3"  (1.6 m).   Weight as of this encounter: 150 lb 1.6 oz (68.1 kg).  @WEIGHTCHANGE @    07/31/19 1124  Weight: 150 lb 1.6 oz (68.1 kg)     Physical Exam Pleasant female with mild rheumatoid arthritis related swelling of hand joints.  Mild basal crackles.  Mask on.  Otherwise nonfocal exam.       Assessment:       ICD-10-CM   1. Interstitial lung disease due to connective  tissue disease ([place])  J84.89    M35.9   2. Rheumatoid arthritis involving multiple sites with positive rheumatoid factor (HCC)  M05.79   3. Cough  R05        Plan:     Patient Instructions     ICD-10-CM   1. Interstitial lung disease due to connective tissue disease ([place])  J84.89    M35.9   2. Gastroesophageal reflux disease, unspecified whether esophagitis present  K21.9   3.  Cough  R05   4. Vaccine counseling  Z71.89    You have interstitial lung disease otherwise call pulmonary fibrosis. This could be from rheumatoid or acid reflux of both -but suspect strongly that rheumatoid arthritis is playing a role Currently PFT worse than a year ago but symptoms, walk test and CT Scan is stable  - so overall I think you are stable  - immuran is probably helping lungs  - hold off anti-fibrotic therapy for  now  Plan Control acid reflux  -Sleep with head end of the bed elevated [best method is to elevate the head and using a breaker pillow at a 30 degree angle or even 20 degree angle]  - can do OTC prilosec 20mg  daily in empty stomach  -Spirometry and DLCO pulmonary function test in 4 months  -Continue Imuran that should help with joints and also has antifibrotic effect  Follow-up -Return in42 months for 30-minute encounter on ILD clinic./Any other day.:   -  If there is progression then we can discuss antifibrotic therapy    (Level 04: Estb 30-39 min  n  visit type: on-site physical face to visit visit spent in total care time and counseling or/and coordination of care by this undersigned MD - Dr Brand Males. This includes one or more of the following on this same day 07/31/2019: pre-charting, chart review, note writing, documentation discussion of test results, diagnostic or treatment recommendations, prognosis, risks and benefits of management options, instructions, education, compliance or risk-factor reduction. It excludes time spent by the [facility] or office staff in the care of the patient . Actual time is 30 min)   SIGNATURE    Dr. Brand Males, M.Joanne., F.C.C.P,  Pulmonary and Critical Care Medicine Staff Physician, [place] Director - Interstitial Lung Disease  Program  Pulmonary [place] at [place], [place], [address]  Pager: [phone], If no answer or between  15:00h - 7:00h: call [phone] Telephone: [phone]  11:59 AM 07/31/2019

## 2019-08-08 NOTE — Progress Notes (Signed)
Office Visit Note  Patient: [name]             Date of Birth: 1975/09/18           MRN: [id number]             PCP: Maurice Small, MD Referring: Maurice Small, MD Visit Date: 08/22/2019 Occupation: '@GUAROCC'$ @  Subjective:  Discuss medication dosing   History of Present Illness: [name] is a 44 y.o. female with history of seropositive rheumatoid arthritis and ILD.  She is taking Imuran 75 mg po daily.  She is tolerating Imuran without any side effects.  She had to reduce the dose of Imuran from 100 to 75 mg daily due to elevated LFTs.  According to the patient she takes an occasional Advil or Excedrin for headaches but has not been taking any Tylenol or drinking any alcohol recently.  She had an abdominal ultrasound performed on 06/12/2019 which did not reveal any acute abnormality.  She denies any recent rheumatoid arthritis flares.  She denies any joint pain or joint swelling at this time.  She states she had recent PFTs performed on 07/31/2019 which had worsened when compared to PFTs from 10/09/2018.  She was evaluated by Dr. Chase Caller who recommended repeating PFTs in September 2021.  She denies any increased shortness of breath or new symptoms.  She continues to cough intermittently which she states is exacerbated by laughing and sometimes first thing in the morning.   Activities of Daily Living:  Patient reports morning stiffness for 0  minutes.   Patient denies nocturnal pain.  Difficulty dressing/grooming: Denies Difficulty climbing stairs: Denies Difficulty getting out of chair: Denies Difficulty using hands for taps, buttons, cutlery, and/or writing: Denies  Review of Systems  Constitutional: Negative for fatigue.  HENT: Negative for mouth sores, mouth dryness and nose dryness.   Eyes: Negative for pain, visual disturbance and dryness.  Respiratory: Positive for cough. Negative for hemoptysis, shortness of breath and difficulty breathing.   Cardiovascular:  Negative for chest pain, palpitations, hypertension and swelling in legs/feet.  Gastrointestinal: Negative for blood in stool, constipation and diarrhea.  Endocrine: Negative for increased urination.  Genitourinary: Negative for painful urination.  Musculoskeletal: Negative for arthralgias, joint pain, joint swelling, myalgias, muscle weakness, morning stiffness, muscle tenderness and myalgias.  Skin: Negative for color change, pallor, rash, hair loss, nodules/bumps, skin tightness, ulcers and sensitivity to sunlight.  Allergic/Immunologic: Negative for susceptible to infections.  Neurological: Negative for dizziness, numbness, headaches and weakness.  Hematological: Negative for swollen glands.  Psychiatric/Behavioral: Negative for depressed mood and sleep disturbance. The patient is not nervous/anxious.     PMFS History:  Patient Active Problem List   Diagnosis Date Noted  . ILD (interstitial lung disease) ([place]) 02/20/2019  . Restrictive lung disease 10/09/2018  . Chronic cough 09/07/2018  . RA (rheumatoid arthritis) ([place]) 09/07/2018  . H/O: pneumonia   . Dysplasia of cervix, low grade (CIN 1 and 2)     Past Medical History:  Diagnosis Date  . Abnormal Pap smear   . Dysplasia of cervix, low grade (CIN 1)   . H/O: pneumonia   . Hypothyroidism   . Rheumatoid arthritis ([place])     Family History  Problem Relation Age of Onset  . Hypertension Mother   . Diabetes Mother   . Hypertension Father   . Rheum arthritis Sister   . Hypertension Brother   . Healthy Son   . Healthy Daughter   . Rheum arthritis  Maternal Aunt   . Rheum arthritis Maternal Aunt    Past Surgical History:  Procedure Laterality Date  . CESAREAN SECTION    . LAPAROSCOPIC TUBAL LIGATION  02/01/2011   Procedure: LAPAROSCOPIC TUBAL LIGATION;  Surgeon: Delice Lesch, MD;  Location: [facility];  Service: Gynecology;  Laterality: Bilateral;  . LEEP     Social History   Social History Narrative  . Not on file    Immunization History  Administered Date(s) Administered  . Influenza,inj,Quad PF,6+ Mos 12/29/2018  . PFIZER SARS-COV-2 Vaccination 05/25/2019, 06/15/2019     Objective: Vital Signs: BP 130/88 (BP Location: Left Arm, Patient Position: Sitting, Cuff Size: Small)   Pulse 78   Resp 12   Ht '5\' 1"'$  (1.549 m)   Wt 149 lb 6.4 oz (67.8 kg)   LMP 08/26/2013   BMI 28.23 kg/m    Physical Exam Vitals and nursing note reviewed.  Constitutional:      Appearance: She is well-developed.  HENT:     Head: Normocephalic and atraumatic.  Eyes:     Conjunctiva/sclera: Conjunctivae normal.  Pulmonary:     Effort: Pulmonary effort is normal.  Abdominal:     General: Bowel sounds are normal.     Palpations: Abdomen is soft.  Musculoskeletal:     Cervical back: Normal range of motion.  Lymphadenopathy:     Cervical: No cervical adenopathy.  Skin:    General: Skin is warm and dry.     Capillary Refill: Capillary refill takes less than 2 seconds.  Neurological:     Mental Status: She is alert and oriented to person, place, and time.  Psychiatric:        Behavior: Behavior normal.      Musculoskeletal Exam: C-spine, thoracic spine, lumbar spine good range of motion.  Shoulder joints, elbow joints, wrist joints, MCPs, PIPs and DIPs good range of motion with no synovitis.  She has complete fist formation bilaterally.  Hip joints, knee joints, ankle joints, MTPs, PIPs and DIPs good range of motion with no synovitis.  No warmth or effusion of bilateral knee joints.  No tenderness or swelling of ankle joints.  CDAI Exam: CDAI Score: -- Patient Global: --; Provider Global: -- Swollen: --; Tender: -- Joint Exam 08/22/2019   No joint exam has been documented for this visit   There is currently no information documented on the homunculus. Go to the Rheumatology activity and complete the homunculus joint exam.  Investigation: No additional findings.  Imaging: No results found.  Recent  Labs: Lab Results  Component Value Date   WBC 3.8 (L) 08/21/2019   HGB 11.1 (L) 08/21/2019   PLT 275.0 08/21/2019   NA 140 06/06/2019   K 3.7 06/06/2019   CL 105 06/06/2019   CO2 27 06/06/2019   GLUCOSE 100 (H) 06/06/2019   BUN 10 06/06/2019   CREATININE 0.66 06/06/2019   BILITOT 0.5 06/06/2019   ALKPHOS 92 07/07/2007   AST 59 (H) 06/06/2019   ALT 26 06/06/2019   PROT 8.1 06/06/2019   ALBUMIN 1.8 (L) 07/07/2007   CALCIUM 9.6 06/06/2019   GFRAA 124 06/06/2019   QFTBGOLDPLUS NEGATIVE 03/07/2019    Speciality Comments: No specialty comments available.  Procedures:  No procedures performed Allergies: Other   Assessment / Plan:     Visit Diagnoses: Rheumatoid arthritis involving multiple sites with positive rheumatoid factor (HCC) - Positive RF, +'14 3 3 '$ eta and elevated ESR.  Diagnosed in 2019 and treated with methotrexate 6 tablets/week in  the past: She has no tenderness or synovitis on exam.  She has not had any recent rheumatoid arthritis flares.  She is not experiencing any joint pain, joint swelling, or morning stiffness at this time.  She has not had any difficulty with ADLs.  She has clinically been doing well on Imuran 75 mg by mouth daily which was started in February 2021.  She has been tolerating Imuran without any side effects or recent infections.  She had to reduce the dose of Imuran from 100 mg daily to 75 mg daily due to elevated LFTs.  It is uncertain what the underlying etiology is, so we will contact her GI specialist.  She had an abdominal ultrasound which did not reveal any acute abnormalities.  We will recheck CMP today.  Ideally she would be on Imuran 100 mg daily to help control the progression of ILD.  She had updated PFTs on 07/31/2019 which had worsened when compared to PFTs from the previous year.  Her symptoms, walk test, and high-resolution chest CT have been stable.  Dr. Chase Caller recommended repeating PFTs in September 2021 and if there has been progression he  is considering antifibrotic therapy.  For now he recommended continuing on Imuran 75 mg daily.  This dose seems to be controlling her rheumatoid arthritis currently.  We will notify her with her lab results once released and make any changes at that time if necessary.  She will follow-up in the office in 5 months.  High risk medication use - Imuran 75 mg po daily (started in February 2021), previously on MTX 4 tablets by mouth once weekly and folic acid 1 mg po daily (started in 02/2018-d/c February 2021).  CBC was drawn yesterday.  CMP was drawn today to monitor for drug toxicity.- Plan: COMPLETE METABOLIC PANEL WITH GFR  Positive ANA (antinuclear antibody) - ANA 1: 1280 speckled, positive Ro antibody: She has no other clinical features of autoimmune disease at this time.   Raynaud's syndrome without gangrene: Not currently active.   ILD (interstitial lung disease) ([place]): High-resolution chest CT was obtained on 03/14/2019.  The appearance of the lungs was compatible with interstitial lung disease with a stable spectrum of findings.  A repeat high-resolution chest CT was recommended in 12 months.  She established care with Dr. Chase Caller on 05/24/2019.  She has not had any new or worsening pulmonary symptoms recently.  She had updated PFTs on 07/31/2019 which has worsened when compared to PFTs from 1 year ago.  According to Dr. Golden Pop note on 07/31/2019 her walk test was stable.  He did not recommend antifibrotic therapy at this time.  He recommended repeating PFTs in September 2021. She will continue taking Imuran 75 mg by mouth daily.  She was unable to continue on Imuran 100 mg daily due to elevated LFTs.  Restrictive lung disease - Diagnosed by Dr. Lamonte Sakai.  She has established care with Dr. Chase Caller.   Elevated LFTs: AST was 59 and ALT was 26 on 06/06/2019.  She had to reduce the dose of Imuran from 100 mg daily to 75 mg daily due to elevated LFTs.  She takes Advil or Excedrin on occasion for  migraines but has not been taking over-the-counter products or drinking alcohol on a regular basis.  She had an abdominal ultrasound performed on 06/12/2019 for evaluation of elevated LFTs.  There was no acute abnormality.  Coarsened heterogeneous appearance of liver parenchyma which is nonspecific but can be seen in patients with underlying hepatocellular disease noted.  She continues to follow up with Dr. Therisa Doyne.  We will contact [facility] for records to determine the cause of her elevated LFTs. We will recheck CMP today.  Other medical conditions are listed as follows:   History of hyperlipidemia  History of hypothyroidism  Vitamin D deficiency  Thyroid goiter  Dysplasia of cervix, low grade (CIN 1 and 2)  H/O: pneumonia  Orders: Orders Placed This Encounter  Procedures  . COMPLETE METABOLIC PANEL WITH GFR   No orders of the defined types were placed in this encounter.    Follow-Up Instructions: Return in about 5 months (around 01/22/2020) for Rheumatoid arthritis, ILD.   Ofilia Neas, PA-C  Note - This record has been created using Dragon software.  Chart creation errors have been sought, but may not always  have been located. Such creation errors do not reflect on  the standard of medical care.

## 2019-08-11 ENCOUNTER — Other Ambulatory Visit (HOSPITAL_COMMUNITY): Payer: 59

## 2019-08-16 NOTE — Telephone Encounter (Signed)
In the setting of pulmonary fibrosis proton pump inhibitor is for a long time.  This risk when taken over many years it can cause some osteoporosis.  If she is worried about this we can discuss at the next visit.  An option now would be for her to take it for a month see if this making any difference and in the future we can decide to restart it based on symptoms or specific testing for acid reflux

## 2019-08-16 NOTE — Telephone Encounter (Signed)
Dr. Ramaswamy, please advise. °

## 2019-08-19 ENCOUNTER — Other Ambulatory Visit: Payer: Self-pay | Admitting: Physician Assistant

## 2019-08-20 NOTE — Telephone Encounter (Signed)
Patient advised she is due to update labs this month.  °

## 2019-08-20 NOTE — Telephone Encounter (Signed)
Last Visit: 05/22/2019 Next Visit: 08/22/2019 Labs: 06/06/2019 Glucose 100, AST 59, RBC 3.68, Hgb 10.3, Hct 32.1  Current Dose per office note 05/22/2019:  Imuran 100 mg daily.   Okay to refill Imuran?

## 2019-08-20 NOTE — Telephone Encounter (Signed)
Ok to refill.  Please notify the patient that she is due to update lab work this month.

## 2019-08-21 ENCOUNTER — Encounter (INDEPENDENT_AMBULATORY_CARE_PROVIDER_SITE_OTHER): Payer: 59

## 2019-08-21 ENCOUNTER — Other Ambulatory Visit: Payer: Self-pay | Admitting: Internal Medicine

## 2019-08-21 ENCOUNTER — Other Ambulatory Visit: Payer: Self-pay

## 2019-08-21 DIAGNOSIS — D649 Anemia, unspecified: Secondary | ICD-10-CM

## 2019-08-21 NOTE — Research (Signed)
Title: Chronic Fibrosing Interstitial Lung Disease with Progressive Phenotype Prospective Outcomes (ILD-PRO) Registry   Protocol #: IPF-PRO-SUB, Clinical Trials # [id number], Sponsor: [employer]  Protocol Version Amendment 4 dated 12Sep2019  and confirmed current on 08/21/2019 Consent Version for todays visit date of 08/21/2019  is Version 5 dated 05Dec2019.  Objectives:   Describe current approaches to diagnosis and treatment of chronic fibrosing ILDs with progressive phenotype   Describe the natural history of chronic fibrosing ILDs with progressive phenotype   Assess quality of life from self-administered participant reported questionnaires for each disease group   Describe participant interactions with the healthcare system, describe treatment practices across multiple institutions for each disease group   Collect biological samples linked to well characterized chronic fibrosing ILDs with progressive phenotype to identify disease biomarkers   Collect data and biological samples that will support future research studies.                                            Key Inclusion Criteria: Willing and able to provide informed consent  Age ? 30 years  Diagnosis of a non-IPF ILD of any duration, including, but not limited to Idiopathic Non-Specific Interstitial Pneumonia (INSIP), Unclassifiable Idiopathic Interstitial Pneumonias (IIPs), Interstitial Pneumonia with Autoimmune Features (IPAF), Autoimmune ILDs such as Rheumatoid Arthritis (RA-ILD) and Systemic Sclerosis (SSC-ILD), Chronic Hypersensitivity Pneumonitis (HP), Sarcoidosis or Exposure-related ILDs such as asbestosis.  Chronic fibrosing ILD defined by reticular abnormality with traction bronchiectasis with or without honeycombing confirmed by chest HRCT scan and/or lung biopsy.  Progressive phenotype as defined by fulfilling at least one of the criteria below of fibrotic changes (progression set point) within  the last 24 months regardless of treatment considered appropriate in individual ILDs:   decline in FVC % predicted (% pred) based on >10% relative decline   decline in FVC % pred based on ? 5 - <10% relative decline in FVC combined with worsening of respiratory symptoms as assessed by the site investigator   decline in FVC % pred based on ? 5 - <10% relative decline in FVC combined with increasing extent of fibrotic changes on chest imaging (HRCT scan) as assessed by the site investigator   decline in DLCO % pred based on ? 10% relative decline   worsening of respiratory symptoms as well as increasing extent of fibrotic changes on chest imaging (HRCT scan) as assessed by the site investigator independent of FVC change.    Key Exclusion Criteria: Malignancy, treated or untreated, other than skin or early stage prostate cancer, within the past 5 years  Currently listed for lung transplantation at the time of enrollment  Currently enrolled in a clinical trial at the time of enrollment in this registry    Clinical Research Coordinator / Research RN note : This visit for [place] with DOB: [birth year] on 08/21/2019 for the above protocol is Visit/Encounter #Enrollment/Baseline and is for purpose of research. The consent for this encounter is under Protocol Version Amendment 4 (12Sep2019) and is currently IRB approved. Subject expressed continued interest and consent in continuing as a study subject. Subject confirmed that there was no change in contact information (e.g. address, telephone, email). Subject thanked for participation in research and contribution to science.   During this visit on 08/21/2019, the subject met withthe research assistants listed belowin the office to discuss the protocol ICF.Arvella Nigh with [employer]  Venia Riveron, RA, participating for training purposes,went over the entire ICF with the subject and the subject was given ample time to read and review the ICF. After  review of the ICF,all questions were answered to the subject's satisfaction.Tammy Parrett, Sub-Investigator,reviewed the ICF with the patient, explained the purpose of research, and asked if there were any additional questions.Shestated that the research assistantsexplained the study to herthoroughly and stated she had no additional questions. The subject,delegated research assistant,and Sub-I signed the ICF and the subject was given a signed copy of the ICF for herrecords. After consent all study related procedures were conducted as per theabovestated protocol. For additional information on today's visit,please refer to subject's paper source binder.  Staff Present: Verline Lema, RA: Gwendolyn Grant, RA: and Rexene Edison, NP, Sub-I  Signed by  [place] Assistant [facility]  [place], [place] 4:26 PM 08/21/2019

## 2019-08-22 ENCOUNTER — Ambulatory Visit (INDEPENDENT_AMBULATORY_CARE_PROVIDER_SITE_OTHER): Payer: 59 | Admitting: Physician Assistant

## 2019-08-22 ENCOUNTER — Encounter: Payer: Self-pay | Admitting: Physician Assistant

## 2019-08-22 VITALS — BP 130/88 | HR 78 | Resp 12 | Ht 61.0 in | Wt 149.4 lb

## 2019-08-22 DIAGNOSIS — R768 Other specified abnormal immunological findings in serum: Secondary | ICD-10-CM | POA: Diagnosis not present

## 2019-08-22 DIAGNOSIS — Z8639 Personal history of other endocrine, nutritional and metabolic disease: Secondary | ICD-10-CM

## 2019-08-22 DIAGNOSIS — Z8701 Personal history of pneumonia (recurrent): Secondary | ICD-10-CM

## 2019-08-22 DIAGNOSIS — E559 Vitamin D deficiency, unspecified: Secondary | ICD-10-CM

## 2019-08-22 DIAGNOSIS — R7989 Other specified abnormal findings of blood chemistry: Secondary | ICD-10-CM

## 2019-08-22 DIAGNOSIS — M0579 Rheumatoid arthritis with rheumatoid factor of multiple sites without organ or systems involvement: Secondary | ICD-10-CM

## 2019-08-22 DIAGNOSIS — I7300 Raynaud's syndrome without gangrene: Secondary | ICD-10-CM | POA: Diagnosis not present

## 2019-08-22 DIAGNOSIS — E049 Nontoxic goiter, unspecified: Secondary | ICD-10-CM

## 2019-08-22 DIAGNOSIS — Z79899 Other long term (current) drug therapy: Secondary | ICD-10-CM

## 2019-08-22 DIAGNOSIS — N870 Mild cervical dysplasia: Secondary | ICD-10-CM

## 2019-08-22 DIAGNOSIS — J984 Other disorders of lung: Secondary | ICD-10-CM

## 2019-08-22 DIAGNOSIS — J849 Interstitial pulmonary disease, unspecified: Secondary | ICD-10-CM

## 2019-08-22 LAB — COMPLETE METABOLIC PANEL WITH GFR
AG Ratio: 1 (calc) (ref 1.0–2.5)
ALT: 20 U/L (ref 6–29)
AST: 42 U/L — ABNORMAL HIGH (ref 10–35)
Albumin: 4.3 g/dL (ref 3.6–5.1)
Alkaline phosphatase (APISO): 49 U/L (ref 31–125)
BUN: 10 mg/dL (ref 7–25)
CO2: 27 mmol/L (ref 20–32)
Calcium: 9.6 mg/dL (ref 8.6–10.2)
Chloride: 104 mmol/L (ref 98–110)
Creat: 0.64 mg/dL (ref 0.50–1.10)
GFR, Est African American: 125 mL/min/{1.73_m2} (ref >=60)
GFR, Est Non African American: 108 mL/min/{1.73_m2} (ref >=60)
Globulin: 4.1 g/dL (calc) — ABNORMAL HIGH (ref 1.9–3.7)
Glucose, Bld: 89 mg/dL (ref 65–99)
Potassium: 3.9 mmol/L (ref 3.5–5.3)
Sodium: 140 mmol/L (ref 135–146)
Total Bilirubin: 0.4 mg/dL (ref 0.2–1.2)
Total Protein: 8.4 g/dL — ABNORMAL HIGH (ref 6.1–8.1)

## 2019-08-22 LAB — CBC
HCT: 34.2 % — ABNORMAL LOW (ref 36.0–46.0)
Hemoglobin: 11.1 g/dL — ABNORMAL LOW (ref 12.0–15.0)
MCHC: 32.6 g/dL (ref 30.0–36.0)
MCV: 84.2 fl (ref 78.0–100.0)
Platelets: 275 10*3/uL (ref 150.0–400.0)
RBC: 4.06 Mil/uL (ref 3.87–5.11)
RDW: 14.6 % (ref 11.5–15.5)
WBC: 3.8 10*3/uL — ABNORMAL LOW (ref 4.0–10.5)

## 2019-08-23 NOTE — Progress Notes (Signed)
Anemia has improved to hgb of 11.1gm%

## 2019-08-23 NOTE — Progress Notes (Signed)
AST is elevated-42 but trending down.  Total protein is elevated.  Dr. Corliss Skains recommends continuing on Imuran 75 mg po daily.

## 2019-08-24 NOTE — Progress Notes (Signed)
Left a detailed message for patient letting her know about her hgb.  Advised to call the office with any questions/concerns.

## 2019-09-20 ENCOUNTER — Telehealth: Payer: Self-pay | Admitting: Internal Medicine

## 2019-09-20 MED ORDER — BENZONATATE 200 MG PO CAPS: 200.0000 mg | ORAL_CAPSULE | Freq: Three times a day (TID) | ORAL | 0 refills | Status: DC | PRN

## 2019-09-20 NOTE — Telephone Encounter (Signed)
Patient advised of recommendation and verbalized understanding. Order placed nothing further is needed.

## 2019-09-20 NOTE — Telephone Encounter (Signed)
Called and spoke with pt about her cough. Pt stated that she has always had a cough when she laughs but stated that overnight 7/7 when she was laughing, she started having a cough that was more "barky sounding." pt also has had some chest discomfort from coughing.  Pt denies any complaints of wheezing and also denies any complaints of fever as temp was 98.  Pt is taking all meds as prescribed. Pt states that she has not tried any other meds other than what she is prescribed.  Due to pt's cough being different than her usual cough that she has had as well as having the discomfort in her chest, pt called for recommendations.  Tammy, please advise.

## 2019-09-20 NOTE — Telephone Encounter (Signed)
No fever, no discolored mucus.  Try Delsym 2 tsp Twice daily  As needed  Cough .  May send Tessalon Three times a day  As needed  Cough . #30, no refills.   If not improving or worsens will need ov.   Please contact office for sooner follow up if symptoms do not improve or worsen or seek emergency care

## 2019-10-28 ENCOUNTER — Telehealth: Payer: Self-pay | Admitting: Internal Medicine

## 2019-10-28 NOTE — Telephone Encounter (Signed)
She has RA ILD PFT in May 2021 shows possible mild progression  Plan  - sprio dlco in oct 2021 and followu with me 30 min slot

## 2019-10-31 NOTE — Telephone Encounter (Signed)
Called and spoke with pt letting her know the info stated by MR and she verbalized understanding. Nothing further needed. 

## 2019-11-28 ENCOUNTER — Other Ambulatory Visit: Payer: 59

## 2019-11-28 ENCOUNTER — Other Ambulatory Visit: Payer: Self-pay

## 2019-11-28 DIAGNOSIS — Z20822 Contact with and (suspected) exposure to covid-19: Secondary | ICD-10-CM

## 2019-11-30 LAB — SARS-COV-2, NAA 2 DAY TAT

## 2019-11-30 LAB — NOVEL CORONAVIRUS, NAA: SARS-CoV-2, NAA: NOT DETECTED

## 2019-12-18 NOTE — Progress Notes (Signed)
Office Visit Note  Patient: [place]             Date of Birth: 1975-12-13           MRN: [id number]             PCP: Maurice Small, MD Referring: Maurice Small, MD Visit Date: 12/19/2019 Occupation: @GUAROCC @  Subjective:  Left elbow joint pain   History of Present Illness: [place] is a 44 y.o. female with history of seropositive rheumatoid arthritis and ILD.  Patient is taking Imuran 75 mg by mouth daily.  She is tolerating Imuran without any side effects.  According to the patient she woke up Sunday night with pain and stiffness in her left elbow joint.  She denies any injuries but states that it may have been exacerbated by shaking her water bottle repeatedly several days in a row.  She has tried taking ibuprofen as well as applying ice with minimal relief.  She also tried applying CBD cream with no relief.  She has had difficulty straightening her left elbow due to the discomfort.  She denies any other joint pain or joint swelling at this time.  She denies any other recent rheumatoid arthritis flares.  Patient reports that she is due to schedule appointment with Dr. Chase Caller for management of ILD.  She denies any new or worsening pulmonary symptoms.   Activities of Daily Living:  Patient reports morning stiffness for 0 minutes.   Patient Reports nocturnal pain.  Difficulty dressing/grooming: Reports Difficulty climbing stairs: Denies Difficulty getting out of chair: Denies Difficulty using hands for taps, buttons, cutlery, and/or writing: Denies  Review of Systems  Constitutional: Negative for fatigue.  HENT: Negative for mouth sores, mouth dryness and nose dryness.   Eyes: Negative for pain, itching, visual disturbance and dryness.  Respiratory: Negative for cough, hemoptysis, shortness of breath and difficulty breathing.   Cardiovascular: Negative for chest pain, palpitations and swelling in legs/feet.  Gastrointestinal: Negative for abdominal pain, blood in  stool, constipation and diarrhea.  Endocrine: Negative for increased urination.  Genitourinary: Negative for difficulty urinating.  Musculoskeletal: Positive for arthralgias, joint pain, joint swelling and morning stiffness. Negative for myalgias, muscle weakness, muscle tenderness and myalgias.  Skin: Negative for color change, rash and redness.  Allergic/Immunologic: Negative for susceptible to infections.  Neurological: Negative for dizziness, numbness, headaches and weakness.  Hematological: Negative for swollen glands.  Psychiatric/Behavioral: Negative for confusion and sleep disturbance.    PMFS History:  Patient Active Problem List   Diagnosis Date Noted  . ILD (interstitial lung disease) ([place]) 02/20/2019  . Restrictive lung disease 10/09/2018  . Chronic cough 09/07/2018  . RA (rheumatoid arthritis) ([place]) 09/07/2018  . H/O: pneumonia   . Dysplasia of cervix, low grade (CIN 1 and 2)     Past Medical History:  Diagnosis Date  . Abnormal Pap smear   . Dysplasia of cervix, low grade (CIN 1)   . H/O: pneumonia   . Hypothyroidism   . Rheumatoid arthritis ([place])     Family History  Problem Relation Age of Onset  . Hypertension Mother   . Diabetes Mother   . Hypertension Father   . Rheum arthritis Sister   . Hypertension Brother   . Healthy Son   . Healthy Daughter   . Rheum arthritis Maternal Aunt   . Rheum arthritis Maternal Aunt    Past Surgical History:  Procedure Laterality Date  . CESAREAN SECTION    . LAPAROSCOPIC TUBAL  LIGATION  02/01/2011   Procedure: LAPAROSCOPIC TUBAL LIGATION;  Surgeon: Delice Lesch, MD;  Location: [facility];  Service: Gynecology;  Laterality: Bilateral;  . LEEP     Social History   Social History Narrative  . Not on file   Immunization History  Administered Date(s) Administered  . Influenza,inj,Quad PF,6+ Mos 12/29/2018  . PFIZER SARS-COV-2 Vaccination 05/25/2019, 06/15/2019     Objective: Vital Signs: BP (!) 144/86 (BP  Location: Right Arm, Patient Position: Sitting, Cuff Size: Small)   Pulse 72   Ht $R'5\' 1"'rR$  (1.549 m)   Wt 151 lb 3.2 oz (68.6 kg)   LMP 08/26/2013   BMI 28.57 kg/m    Physical Exam Vitals and nursing note reviewed.  Constitutional:      Appearance: She is well-developed.  HENT:     Head: Normocephalic and atraumatic.  Eyes:     Conjunctiva/sclera: Conjunctivae normal.  Pulmonary:     Effort: Pulmonary effort is normal.  Abdominal:     Palpations: Abdomen is soft.  Musculoskeletal:     Cervical back: Normal range of motion.  Skin:    General: Skin is warm and dry.     Capillary Refill: Capillary refill takes less than 2 seconds.  Neurological:     Mental Status: She is alert and oriented to person, place, and time.  Psychiatric:        Behavior: Behavior normal.      Musculoskeletal Exam: C-spine, thoracic spine, lumbar spine good ROM.  No midline spinal tenderness.  No SI joint tenderness.  Shoulder joints good ROM with no discomfort.  Tenderness and warmth over the left lateral epicondyle and joint line.  Right elbow has good ROM with no tenderness or inflammation. Wrist joints, MCPs, PIPs, and DIPs good ROM with no synovitis.  Complete fist formation bilaterally.  Hip joints, knee joints, ankle joints, MTPs, PIPs, and DIPs good ROM with no discomfort.  No warmth or effusion of knee joints.  No tenderness or swelling of ankle joints.  No tenderness of MTP joints.   CDAI Exam: CDAI Score: 2.4  Patient Global: 2 mm; Provider Global: 2 mm Swollen: 1 ; Tender: 1  Joint Exam 12/19/2019      Right  Left  Elbow     Swollen Tender     Investigation: No additional findings.  Imaging: No results found.  Recent Labs: Lab Results  Component Value Date   WBC 3.8 (L) 08/21/2019   HGB 11.1 (L) 08/21/2019   PLT 275.0 08/21/2019   NA 140 08/22/2019   K 3.9 08/22/2019   CL 104 08/22/2019   CO2 27 08/22/2019   GLUCOSE 89 08/22/2019   BUN 10 08/22/2019   CREATININE 0.64  08/22/2019   BILITOT 0.4 08/22/2019   ALKPHOS 92 07/07/2007   AST 42 (H) 08/22/2019   ALT 20 08/22/2019   PROT 8.4 (H) 08/22/2019   ALBUMIN 1.8 (L) 07/07/2007   CALCIUM 9.6 08/22/2019   GFRAA 125 08/22/2019   QFTBGOLDPLUS NEGATIVE 03/07/2019    Speciality Comments: No specialty comments available.  Procedures:  Medium Joint Inj: L lateral epicondyle on 12/19/2019 1:36 PM Indications: pain Details: 27 G 1.5 in needle, lateral approach Medications: 0.5 mL lidocaine 1 %; 30 mg triamcinolone acetonide 40 MG/ML Aspirate: 0 mL Outcome: tolerated well, no immediate complications Procedure, treatment alternatives, risks and benefits explained, specific risks discussed. Consent was given by the patient. Immediately prior to procedure a time out was called to verify the correct patient, procedure, equipment,  support staff and site/side marked as required. Patient was prepped and draped in the usual sterile fashion.     Allergies: Other   Assessment / Plan:     Visit Diagnoses: Rheumatoid arthritis involving multiple sites with positive rheumatoid factor (HCC) - Positive RF, +$RemoveBefor'14 3 3 'smfPbHieOpZD$ eta and elevated ESR.  Diagnosed in 2019 and treated with methotrexate 6 tablets/week in the past: She presents today with left elbow joint pain, stiffness, and inflammation.  According to the patient she woke up Sunday night with severe pain and stiffness in her left elbow. She did not have any injury but reports some overuse activities recently.  She has warmth and tenderness palpation over the olecranon process as well as over the lateral epicondyle. Mild left elbow joint contracture noted. A left lateral epicondylitis cortisone injection was performed today in the office.  She tolerated the procedure well.  Aftercare was discussed.  She is not experiencing any other joint pain or inflammation at this time.  Overall she is clinically been doing well on Imuran 75 mg by mouth daily.  She has not missed any doses recently  and is tolerating without any side effects.  She was advised to notify us if her left elbow joint pain persists or worsens and we can send in a prednisone taper and talk about other treatment options in the future.  She will follow-up in the office in 3 months.  High risk medication use - Imuran 75 mg po qd (started in February 2021), previously on MTX 4 tablets by mouth once weekly and folic acid 1 mg po qd (started in 02/2018-d/c February 2021).  CBC was drawn on 08/21/2019 and CMP was drawn on 08/22/2019. She is due to update CBC and CMP today.  Orders were released.  She will be due to update lab work in January and every 3 months to monitor for drug toxicity.  Standing orders for CBC and CMP are in place.- Plan: COMPLETE METABOLIC PANEL WITH GFR, CBC with Differential/Platelet She has not had any recent infections.  She has received both COVID-19 vaccinations and is planning on receiving her third dose.  She was advised to avoid taking Tylenol and NSAIDs 24 hours prior to the third dose.  She was advised to notify us or her PCP if she develops a COVID-19 infection in order to receive the antibody infusion.  She voiced understanding.  Positive ANA (antinuclear antibody) - ANA 1: 1280 speckled, positive Ro antibody: She has no other clinical features of autoimmune disease at this time.  She is not had any symptoms of Raynaud's, sicca symptoms, oral or nasal ulcerations, enlarged lymph nodes, rashes, hair loss, or photosensitivity.  She was advised to notify us if she develops any new or worsening symptoms.  Raynaud's syndrome without gangrene: Not currently active.  She has not had any symptoms of Raynaud's recently.  No digital ulcerations or signs of gangrene were noted on exam.  ILD (interstitial lung disease) ([place]) - High-resolution chest CT was obtained on 03/14/2019.  The appearance of the lungs was compatible with interstitial lung disease with a stable spectrum of findings.  She is followed closely  by Dr. Chase Caller.  Her most recent office visit was on 07/31/2019 at which time he recommended continuing Imuran.  PFTs were updated at that office visit.  She has not been experiencing any new or worsening pulmonary symptoms recently.  She was advised to schedule a follow-up visit with Dr. Chase Caller.  Restrictive lung disease - Diagnosed by Dr.  Byrum.  She has established care with Dr. Chase Caller.  PFTs were updated on 07/31/2019.  She was advised to schedule follow-up visit with Dr. Chase Caller.  Lateral epicondylitis, left elbow - Plan: Medium Joint Inj: L lateral epicondyle  Elevated LFTs: AST 42 and ALT 20 on 08/22/19.  CMP was ordered today.  Other medical conditions are listed as follows:  History of hypothyroidism  History of hyperlipidemia  Thyroid goiter  Vitamin D deficiency  H/O: pneumonia  Dysplasia of cervix, low grade (CIN 1 and 2)  Orders: Orders Placed This Encounter  Procedures  . Medium Joint Inj: L lateral epicondyle  . COMPLETE METABOLIC PANEL WITH GFR  . CBC with Differential/Platelet   No orders of the defined types were placed in this encounter.    Follow-Up Instructions: Return in about 3 months (around 03/20/2020) for Rheumatoid arthritis, ILD.   Ofilia Neas, PA-C  Note - This record has been created using Dragon software.  Chart creation errors have been sought, but may not always  have been located. Such creation errors do not reflect on  the standard of medical care.

## 2019-12-19 ENCOUNTER — Ambulatory Visit (INDEPENDENT_AMBULATORY_CARE_PROVIDER_SITE_OTHER): Payer: 59 | Admitting: Physician Assistant

## 2019-12-19 ENCOUNTER — Encounter: Payer: Self-pay | Admitting: Physician Assistant

## 2019-12-19 ENCOUNTER — Other Ambulatory Visit: Payer: Self-pay

## 2019-12-19 ENCOUNTER — Encounter: Payer: Self-pay | Admitting: *Deleted

## 2019-12-19 VITALS — BP 144/86 | HR 72 | Ht 61.0 in | Wt 151.2 lb

## 2019-12-19 DIAGNOSIS — R7989 Other specified abnormal findings of blood chemistry: Secondary | ICD-10-CM

## 2019-12-19 DIAGNOSIS — R768 Other specified abnormal immunological findings in serum: Secondary | ICD-10-CM

## 2019-12-19 DIAGNOSIS — M0579 Rheumatoid arthritis with rheumatoid factor of multiple sites without organ or systems involvement: Secondary | ICD-10-CM

## 2019-12-19 DIAGNOSIS — J984 Other disorders of lung: Secondary | ICD-10-CM

## 2019-12-19 DIAGNOSIS — I7300 Raynaud's syndrome without gangrene: Secondary | ICD-10-CM | POA: Diagnosis not present

## 2019-12-19 DIAGNOSIS — Z79899 Other long term (current) drug therapy: Secondary | ICD-10-CM | POA: Diagnosis not present

## 2019-12-19 DIAGNOSIS — E049 Nontoxic goiter, unspecified: Secondary | ICD-10-CM

## 2019-12-19 DIAGNOSIS — Z8701 Personal history of pneumonia (recurrent): Secondary | ICD-10-CM

## 2019-12-19 DIAGNOSIS — E559 Vitamin D deficiency, unspecified: Secondary | ICD-10-CM

## 2019-12-19 DIAGNOSIS — M7712 Lateral epicondylitis, left elbow: Secondary | ICD-10-CM

## 2019-12-19 DIAGNOSIS — Z8639 Personal history of other endocrine, nutritional and metabolic disease: Secondary | ICD-10-CM

## 2019-12-19 DIAGNOSIS — N870 Mild cervical dysplasia: Secondary | ICD-10-CM

## 2019-12-19 DIAGNOSIS — J849 Interstitial pulmonary disease, unspecified: Secondary | ICD-10-CM

## 2019-12-19 MED ORDER — LIDOCAINE HCL 1 % IJ SOLN
0.5000 mL | INTRAMUSCULAR | Status: AC | PRN
  Administered 2019-12-19: .5 mL

## 2019-12-19 MED ORDER — TRIAMCINOLONE ACETONIDE 40 MG/ML IJ SUSP
30.0000 mg | INTRAMUSCULAR | Status: AC | PRN
  Administered 2019-12-19: 30 mg via INTRA_ARTICULAR

## 2019-12-19 NOTE — Patient Instructions (Addendum)
COVID-19 vaccine recommendations:   COVID-19 vaccine is recommended for everyone (unless you are allergic to a vaccine component), even if you are on a medication that suppresses your immune system.   If you are on Methotrexate, Cellcept (mycophenolate), Rinvoq, Harriette Ohara, and Olumiant- hold the medication for 1 week after each vaccine. Hold Methotrexate for 2 weeks after the single dose COVID-19 vaccine.   If you are on Orencia subcutaneous injection - hold medication one week prior to and one week after the first COVID-19 vaccine dose (only).   If you are on Orencia IV infusions- time vaccination administration so that the first COVID-19 vaccination will occur four weeks after the infusion and postpone the subsequent infusion by one week.   If you are on Cyclophosphamide or Rituxan infusions please contact your doctor prior to receiving the COVID-19 vaccine.   Do not take Tylenol or any anti-inflammatory medications (NSAIDs) 24 hours prior to the COVID-19 vaccination.   There is no direct evidence about the efficacy of the COVID-19 vaccine in individuals who are on medications that suppress the immune system.   Even if you are fully vaccinated, and you are on any medications that suppress your immune system, please continue to wear a mask, maintain at least six feet social distance and practice hand hygiene.   If you develop a COVID-19 infection, please contact your PCP or our office to determine if you need antibody infusion.  The booster vaccine is now available for immunocompromised patients. It is advised that if you had Pfizer vaccine you should get [employer] booster.  If you had a Moderna vaccine then you should get a Moderna booster. Johnson and Laural Ben[employer] does not have a booster vaccine at this time.  Please see the following web sites for updated information.    https://[email or web address]/[email or web address]/[email or web address]-Vaccination-Patient-Resources.pdf  [email or web address]://[email or web address]/About-[email or web address]-Releases/ID/1159  Standing Labs We placed an order today for your standing lab work.   Please have your standing labs drawn in January and every 3 months   If possible, please have your labs drawn 2 weeks prior to your appointment so that the provider can discuss your results at your appointment.  We have open lab daily Monday through Thursday from 8:30-12:30 PM and 1:30-4:30 PM and Friday from 8:30-12:30 PM and 1:30-4:00 PM at the office of Dr. Pollyann Savoy, [facility].   Please be advised, patients with office appointments requiring lab work will take precedents over walk-in lab work.  If possible, please come for your lab work on Monday and Friday afternoons, as you may experience shorter wait times. The office is located at [address], [place], [place] [address] No appointment is necessary.   Labs are drawn by [facility]. Please bring your co-pay at the time of your lab draw.  You may receive a bill from [facility] for your lab work.  If you wish to have your labs drawn at another location, please call the office 24 hours in advance to send orders.  If you have any questions regarding directions or hours of operation,  please call [phone].   As a reminder, please drink plenty of water prior to coming for your lab work. Thanks!   Neck Exercises Ask your health care provider which exercises are safe for you. Do exercises exactly as told by your health care provider and adjust them as directed. It is normal to feel mild stretching, pulling, tightness, or discomfort as you do these exercises. Stop right away if you feel sudden pain or your pain gets worse. Do  not begin these exercises until told by your health care provider. Neck exercises can be important for many reasons. They can improve strength and maintain  flexibility in your neck, which will help your upper back and prevent neck pain. Stretching exercises Rotation neck stretching  1. Sit in a chair or stand up. 2. Place your feet flat on the floor, shoulder width apart. 3. Slowly turn your head (rotate) to the right until a slight stretch is felt. Turn it all the way to the right so you can look over your right shoulder. Do not tilt or tip your head. 4. Hold this position for 10-30 seconds. 5. Slowly turn your head (rotate) to the left until a slight stretch is felt. Turn it all the way to the left so you can look over your left shoulder. Do not tilt or tip your head. 6. Hold this position for 10-30 seconds. Repeat __________ times. Complete this exercise __________ times a day. Neck retraction 1. Sit in a sturdy chair or stand up. 2. Look straight ahead. Do not bend your neck. 3. Use your fingers to push your chin backward (retraction). Do not bend your neck for this movement. Continue to face straight ahead. If you are doing the exercise properly, you will feel a slight sensation in your throat and a stretch at the back of your neck. 4. Hold the stretch for 1-2 seconds. Repeat __________ times. Complete this exercise __________ times a day. Strengthening exercises Neck press 1. Lie on your back on a firm bed or on the floor with a pillow under your head. 2. Use your neck muscles to push your head down on the pillow and straighten your spine. 3. Hold the position as well as you can. Keep your head facing up (in a neutral position) and your chin tucked. 4. Slowly count to 5 while holding this position. Repeat __________ times. Complete this exercise __________ times a day. Isometrics These are exercises in which you strengthen the muscles in your neck while keeping your neck still (isometrics). 1. Sit in a supportive chair and place your hand on your forehead. 2. Keep your head and face facing straight ahead. Do not flex or extend your neck  while doing isometrics. 3. Push forward with your head and neck while pushing back with your hand. Hold for 10 seconds. 4. Do the sequence again, this time putting your hand against the back of your head. Use your head and neck to push backward against the hand pressure. 5. Finally, do the same exercise on either side of your head, pushing sideways against the pressure of your hand. Repeat __________ times. Complete this exercise __________ times a day. Prone head lifts 1. Lie face-down (prone position), resting on your elbows so that your chest and upper back are raised. 2. Start with your head facing downward, near your chest. Position your chin either on or near your chest. 3. Slowly lift your head upward. Lift until you are looking straight ahead. Then continue lifting your head as far back as you can comfortably stretch. 4. Hold your head up for 5 seconds. Then slowly lower it to your starting position. Repeat __________ times. Complete this exercise __________ times a day. Supine head lifts 1. Lie on your back (supine position), bending your knees to point to the ceiling and keeping your feet flat on the floor. 2. Lift your head slowly off the floor, raising your chin toward your chest. 3. Hold for 5 seconds. Repeat __________ times. Complete  this exercise __________ times a day. Scapular retraction 1. Stand with your arms at your sides. Look straight ahead. 2. Slowly pull both shoulders (scapulae) backward and downward (retraction) until you feel a stretch between your shoulder blades in your upper back. 3. Hold for 10-30 seconds. 4. Relax and repeat. Repeat __________ times. Complete this exercise __________ times a day. Contact a health care provider if:  Your neck pain or discomfort gets much worse when you do an exercise.  Your neck pain or discomfort does not improve within 2 hours after you exercise. If you have any of these problems, stop exercising right away. Do not do the  exercises again unless your health care provider says that you can. Get help right away if:  You develop sudden, severe neck pain. If this happens, stop exercising right away. Do not do the exercises again unless your health care provider says that you can. This information is not intended to replace advice given to you by your health care provider. Make sure you discuss any questions you have with your health care provider. Document Revised: 12/28/2017 Document Reviewed: 12/28/2017 Elsevier Patient Education  2020 [employer].  Tennis Elbow Rehab Ask your health care provider which exercises are safe for you. Do exercises exactly as told by your health care provider and adjust them as directed. It is normal to feel mild stretching, pulling, tightness, or discomfort as you do these exercises. Stop right away if you feel sudden pain or your pain gets worse. Do not begin these exercises until told by your health care provider. Stretching and range-of-motion exercises These exercises warm up your muscles and joints and improve the movement and flexibility of your elbow. These exercises also help to relieve pain, numbness, and tingling. Wrist flexion, assisted  1. Straighten your left / right elbow in front of you with your palm facing down toward the floor. ? If told by your health care provider, bend your left / right elbow to a 90-degree angle (right angle) at your side. 2. With your other hand, gently push over the back of your left / right hand so your fingers point toward the floor (flexion). Stop when you feel a gentle stretch on the back of your forearm. 3. Hold this position for __________ seconds. Repeat __________ times. Complete this exercise __________ times a day. Wrist extension, assisted  1. Straighten your left / right elbow in front of you with your palm facing up toward the ceiling. ? If told by your health care provider, bend your left / right elbow to a 90-degree angle  (right angle) at your side. 2. With your other hand, gently pull your left / right hand and fingers toward the floor (extension). Stop when you feel a gentle stretch on the palm side of your forearm. 3. Hold this position for __________ seconds. Repeat __________ times. Complete this exercise __________ times a day. Assisted forearm rotation, supination 1. Sit or stand with your left / right elbow bent to a 90-degree angle (right angle) at your side. 2. Using your uninjured hand, turn (rotate) your left / right palm up toward the ceiling (supination) until you feel a gentle stretch along the inside of your forearm. 3. Hold this position for __________ seconds. Repeat __________ times. Complete this exercise __________ times a day. Assisted forearm rotation, pronation 1. Sit or stand with your left / right elbow bent to a 90-degree angle (right angle) at your side. 2. Using your uninjured hand, rotate your left /  right palm down toward the floor (pronation) until you feel a gentle stretch along the outside of your forearm. 3. Hold this position for __________ seconds. Repeat __________ times. Complete this exercise __________ times a day. Strengthening exercises These exercises build strength and endurance in your forearm and elbow. Endurance is the ability to use your muscles for a long time, even after they get tired. Radial deviation  1. Stand with a __________ weight or a hammer in your left / right hand. Or, sit while holding a rubber exercise band or tubing, with your left / right forearm supported on a table or countertop. ? If you are standing, position your forearm so that your thumb is facing forward. If you are sitting, position your forearm so that the thumb is facing the ceiling. This is the neutral position. 2. Raise your hand upward in front of you so your thumb moves toward the ceiling (radial deviation), or pull up on the rubber tubing. Keep your forearm and elbow still while you  move your wrist only. 3. Hold this position for __________ seconds. 4. Slowly return to the starting position. Repeat __________ times. Complete this exercise __________ times a day. Wrist extension, eccentric 1. Sit with your left / right forearm palm-down and supported on a table or other surface. Let your left / right wrist extend over the edge of the surface. 2. Hold a __________ weight or a piece of exercise band or tubing in your left / right hand. ? If using a rubber exercise band or tubing, hold the other end of the tubing with your other hand. 3. Use your uninjured hand to move your left / right hand up toward the ceiling. 4. Take your uninjured hand away and slowly return to the starting position using only your left / right hand. Lowering your arm under tension is called eccentric extension. Repeat __________ times. Complete this exercise __________ times a day. Wrist extension Do not do this exercise if it causes pain at the outside of your elbow. Only do this exercise once instructed by your health care provider. 1. Sit with your left / right forearm supported on a table or other surface and your palm turned down toward the floor. Let your left / right wrist extend over the edge of the surface. 2. Hold a __________ weight or a piece of rubber exercise band or tubing. ? If you are using a rubber exercise band or tubing, hold the band or tubing in place with your other hand to provide resistance. 3. Slowly bend your wrist so your hand moves up toward the ceiling (extension). Move only your wrist, keeping your forearm and elbow still. 4. Hold this position for __________ seconds. 5. Slowly return to the starting position. Repeat __________ times. Complete this exercise __________ times a day. Forearm rotation, supination To do this exercise, you will need a lightweight hammer or rubber mallet. 1. Sit with your left / right forearm supported on a table or other surface. Bend your elbow  to a 90-degree angle (right angle). Position your forearm so that your palm is facing down toward the floor, with your hand resting over the edge of the table. 2. Hold a hammer in your left / right hand. ? To make this exercise easier, hold the hammer near the head of the hammer. ? To make this exercise harder, hold the hammer near the end of the handle. 3. Without moving your wrist or elbow, slowly rotate your forearm so your palm faces  up toward the ceiling (supination). 4. Hold this position for __________ seconds. 5. Slowly return to the starting position. Repeat __________ times. Complete this exercise __________ times a day. Shoulder blade squeeze 1. Sit in a stable chair or stand with good posture. If you are sitting down, do not let your back touch the back of the chair. 2. Your arms should be at your sides with your elbows bent to a 90-degree angle (right angle). Position your forearms so that your thumbs are facing the ceiling (neutral position). 3. Without lifting your shoulders up, squeeze your shoulder blades tightly together. 4. Hold this position for __________ seconds. 5. Slowly release and return to the starting position. Repeat __________ times. Complete this exercise __________ times a day. This information is not intended to replace advice given to you by your health care provider. Make sure you discuss any questions you have with your health care provider. Document Revised: 06/22/2018 Document Reviewed: 04/25/2018 Elsevier Patient Education  2020 [employer].

## 2019-12-20 LAB — CBC WITH DIFFERENTIAL/PLATELET
Absolute Monocytes: 486 cells/uL (ref 200–950)
Basophils Absolute: 18 cells/uL (ref 0–200)
Basophils Relative: 0.4 %
Eosinophils Absolute: 153 cells/uL (ref 15–500)
Eosinophils Relative: 3.4 %
HCT: 36.1 % (ref 35.0–45.0)
Hemoglobin: 11.8 g/dL (ref 11.7–15.5)
Lymphs Abs: 1512 cells/uL (ref 850–3900)
MCH: 27.8 pg (ref 27.0–33.0)
MCHC: 32.7 g/dL (ref 32.0–36.0)
MCV: 85.1 fL (ref 80.0–100.0)
MPV: 10.9 fL (ref 7.5–12.5)
Monocytes Relative: 10.8 %
Neutro Abs: 2331 cells/uL (ref 1500–7800)
Neutrophils Relative %: 51.8 %
Platelets: 280 10*3/uL (ref 140–400)
RBC: 4.24 10*6/uL (ref 3.80–5.10)
RDW: 13.8 % (ref 11.0–15.0)
Total Lymphocyte: 33.6 %
WBC: 4.5 10*3/uL (ref 3.8–10.8)

## 2019-12-20 LAB — COMPLETE METABOLIC PANEL WITH GFR
AG Ratio: 1 (calc) (ref 1.0–2.5)
ALT: 16 U/L (ref 6–29)
AST: 36 U/L — ABNORMAL HIGH (ref 10–35)
Albumin: 4.4 g/dL (ref 3.6–5.1)
Alkaline phosphatase (APISO): 60 U/L (ref 31–125)
BUN: 13 mg/dL (ref 7–25)
CO2: 28 mmol/L (ref 20–32)
Calcium: 10.1 mg/dL (ref 8.6–10.2)
Chloride: 102 mmol/L (ref 98–110)
Creat: 0.73 mg/dL (ref 0.50–1.10)
GFR, Est African American: 115 mL/min/{1.73_m2} (ref >=60)
GFR, Est Non African American: 99 mL/min/{1.73_m2} (ref >=60)
Globulin: 4.3 g/dL (calc) — ABNORMAL HIGH (ref 1.9–3.7)
Glucose, Bld: 80 mg/dL (ref 65–99)
Potassium: 3.8 mmol/L (ref 3.5–5.3)
Sodium: 141 mmol/L (ref 135–146)
Total Bilirubin: 0.6 mg/dL (ref 0.2–1.2)
Total Protein: 8.7 g/dL — ABNORMAL HIGH (ref 6.1–8.1)

## 2019-12-20 NOTE — Progress Notes (Signed)
Total protein is elevated-8.7 and trending up.  Please forward lab work to PCP.  AST is borderline elevated but improving.  ALT WNL. Please advise the patient to continue to avoid the use of tylenol, NSAIDs, and alcohol. Rest of CMP WNL. CBC WNL.   Please ask the patient how she is doing after having her left elbow injected with cortisone yesterday.

## 2020-01-06 ENCOUNTER — Other Ambulatory Visit: Payer: Self-pay | Admitting: Emergency Medicine

## 2020-01-15 ENCOUNTER — Encounter: Payer: Self-pay | Admitting: Rheumatology

## 2020-01-15 MED ORDER — AZATHIOPRINE 50 MG PO TABS: 75.0000 mg | ORAL_TABLET | Freq: Every day | ORAL | 0 refills | Status: DC

## 2020-01-15 NOTE — Telephone Encounter (Signed)
Last Visit: 12/19/2019 Next Visit: 03/27/2020 Labs: 12/19/2019 Total protein is elevated-8.7 and trending up. AST is borderline elevated but improving. ALT WNL. Rest of CMP WNL. CBC WNL.  Current Dose per office note 12/19/2019: Imuran 75 mg po qd  DX:  Rheumatoid arthritis involving multiple sites with positive rheumatoid factor   Okay to refill Imuran?

## 2020-01-23 ENCOUNTER — Ambulatory Visit: Payer: 59 | Admitting: Physician Assistant

## 2020-01-29 ENCOUNTER — Encounter: Payer: 59 | Admitting: *Deleted

## 2020-01-29 ENCOUNTER — Encounter: Payer: Self-pay | Admitting: Internal Medicine

## 2020-01-29 ENCOUNTER — Ambulatory Visit (INDEPENDENT_AMBULATORY_CARE_PROVIDER_SITE_OTHER): Payer: 59 | Admitting: Internal Medicine

## 2020-01-29 ENCOUNTER — Other Ambulatory Visit: Payer: Self-pay

## 2020-01-29 VITALS — BP 128/72 | HR 89 | Temp 98.3°F | Ht 63.0 in | Wt 149.0 lb

## 2020-01-29 DIAGNOSIS — J8489 Other specified interstitial pulmonary diseases: Secondary | ICD-10-CM

## 2020-01-29 DIAGNOSIS — J849 Interstitial pulmonary disease, unspecified: Secondary | ICD-10-CM | POA: Diagnosis not present

## 2020-01-29 DIAGNOSIS — M359 Systemic involvement of connective tissue, unspecified: Secondary | ICD-10-CM

## 2020-01-29 LAB — PULMONARY FUNCTION TEST
DL/VA % pred: 125 %
DL/VA: 5.53 ml/min/mmHg/L
DLCO cor % pred: 64 %
DLCO cor: 13.43 ml/min/mmHg
DLCO unc % pred: 61 %
DLCO unc: 12.72 ml/min/mmHg
FEF 25-75 Pre: 3.02 L/sec
FEF2575-%Pred-Pre: 117 %
FEV1-%Pred-Pre: 67 %
FEV1-Pre: 1.58 L
FEV1FVC-%Pred-Pre: 112 %
FEV6-%Pred-Pre: 60 %
FEV6-Pre: 1.7 L
FEV6FVC-%Pred-Pre: 102 %
FVC-%Pred-Pre: 59 %
FVC-Pre: 1.7 L
Pre FEV1/FVC ratio: 93 %
Pre FEV6/FVC Ratio: 100 %

## 2020-01-29 NOTE — Progress Notes (Signed)
Spirometry and Dlco done today. 

## 2020-01-29 NOTE — Progress Notes (Signed)
17/2020 --44 year old woman with RA on methotrexate, followed now for chronic cough.  She had pulmonary function testing done that showed mixed obstruction and restriction without a significant bronchodilator response.  We will treat her for reflux and her cough did improve some, did not resolve.  I tried her last time on empiric albuterol to see if she would get any benefit.  She reports that her cough is largely unchanged. She is not having dyspnea, no breakthrough GERD.  Based on the restriction on pulmonary function testing and her history of RA we obtained a CT scan of the chest 10/26/2018 that I reviewed.  This shows some mild but diffuse base-predominent groundglass change peripherally without any frank honeycombing most notable on the left.   [name], [facility] > planning to do televisit Thurs, decrease MTX, possibly add a 2nd agent.   Video visit 02/20/2019 --44 year old woman with rheumatoid arthritis on methotrexate, mixed restriction and obstruction on pulmonary function testing and mild diffuse groundglass change peripherally on CT scan of the chest.  I saw her originally for chronic cough in the setting of these issues and also GERD. She has been followed by [name] with Rheumatology. She was treated with prednisone , tapering down now on  a day. Remains on MTX  once a week. Her cough has completely resolved. Breathing well.   ROV 04/26/2019 --[name] is 30 and has a history of rheumatoid arthritis on methotrexate, mixed restriction and obstruction on PFTs.  Have seen her for chronic cough and an abnormal CT scan of the chest with peripheral groundglass changes.  She is on methotrexate 10 mg once a week, was treated with prednisone 60 mg tapering down to off as of 03/21/19. Now working w Joanne [name]. She started Imuran on 04/11/19.    We repeated her CT scan of the chest on 03/14/2019 and I have reviewed, this shows persistent areas of base predominant groundglass and  septal thickening, mild peripheral bronchiolectasis especially in the left base.  No frank honeycombing.  Stable compared with 10/26/2018.  There are a few scattered tiny pulmonary nodules that are stable.  OV 05/24/2019 -first visit has interstitial lung disease center.  Transfer of care from Joanne. Levy French to the ILD center with Joanne. Marchelle French.  Subjective:  Patient ID: [name], female , DOB: [birth year] , age 17 y.o. , MRN: [id number] , ADDRESS: [address] [place] [address]  Rheumatologist - Joanne [name]   05/24/2019 -   Chief Complaint  Patient presents with  . 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 Pt states she does become SOB with exertion and states she does still have a cough when she laughs. Pt denies any complaints of CP.     History is gained from talking to the patient and review of the chart.   HPI [name] 44 y.o. -carries a diagnosis of rheumatoid arthritis.  Her sister also has rheumatoid arthritis.  She was diagnosed in 2019.  Initially treated with methotrexate.  Used to see Ms. [name] physician assistant at [facility] rheumatology but has switched to Joanne. Pollyann French.  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 Tommi French 2 days ago on 05/22/2019.  Note is that she is on 100 mg once daily of Imuran tolerating it well without any side effects.  No increase in joint pain after starting Imuran but she continues to experience stiffness and swelling in the morning for 20 to 30 minutes.  Raynard itself was not active.  In terms of her lung disease she has been seeing Joanne. Delton Coombes since August 2020.  A CT scan in August  2020 showed left lower lobe groundglass opacity without any honeycombing.  Acid reflux which is a ongoing problem for her was considered as an etiology.  She also had cough and shortness of breath.  She was treated with 4 months of prednisone which ended early January 2021.  After that the cough is minimal.  She has occasional acid reflux.  In terms of her shortness of breath is very minimal as documented below.  Currently overall from a pulmonary standpoint she is feeling well.  Last pulmonary function testing was in July 2020 and last CT scan of the chest was in end of December 2020 and was essentially unchanged over 4 months.   She had questions about COVID-19 vaccine.  [facility] [facility] Comprehensive ILD Questionnaire 0= filled out 06/01/2019   Symptoms: Insidious onset of shortness of breath the last 1 year.  Since it started it is the same.  There is mild cough.    Past Medical History :  -Rheumatoid arthritis diagnosed in 2019.  Has thyroid disease hypothyroidism diagnosed October 2009.  Has history of pneumonia not otherwise specified January 1998.  History is otherwise negative   ROS: Positive for joint stiffness and pain since July 2019 leading to the diagnosis of rheumatoid arthritis subsequently.  She had Raynaud's description once in January 2020.  She is ongoing acid reflux on and off since 2015   FAMILY HISTORY of LUNG DISEASE: Father has emphysema.  Sister has autoimmune disease.   EXPOSURE HISTORY: Denies cigarettes or marijuana or vaping or cocaine or intravenous drug use   HOME and HOBBY DETAILS : Single-family home for the last 16 years.  Age of the home is 17 years.  Does use a humidifier but no mildew in the humidifier.  There is some mildew in the shower curtains.  No mold in the [facility] duct.  No other organic antigen exposure in the house   OCCUPATIONAL HISTORY (122 questions) : Negative for organic antigen exposure.  Negative for inorganic antigen  exposure   PULMONARY TOXICITY HISTORY (27 items): On methotrexate between October 2019 and February 2021.  To prednisone between September 2020 and January 2021.  On Imuran since February 2021    Results for CHRISTENA, KERSHAW "DEE DEE" (MRN [id number]) as of 05/24/2019 12:22  Ref. Range 10/09/2018 12:56  FVC-%Pred-Pre Latest Units: % 65  FEV1-Pre Latest Units: L 1.57  Results for SHENNA, PARFITT "DEE DEE" (MRN [id number]) as of 05/24/2019 12:22  Ref. Range 10/09/2018 12:56  TLC Latest Units: L 3.07  TLC % pred Latest Units: % 63  Results for Vandermeer, Demetra D "DEE DEE" (MRN [id number]) as of 05/24/2019 12:22  Ref. Range 10/09/2018 12:56  DLCO unc Latest Units: ml/min/mmHg 15.54  DLCO unc % pred Latest Units: % 75    IMPRESSION: High-resolution CT chest December 2020 Lungs/Pleura: High-resolution images again demonstrate basal predominant areas of ground-glass attenuation, septal thickening, mild thickening of the peribronchovascular interstitium and some very mild peripheral bronchiolectasis (most evident in the left lower lobe). No  frank honeycombing. Findings are essentially stable compared to the recent prior study from 10/26/2018. Inspiratory and expiratory imaging is unremarkable. A few scattered tiny pulmonary nodules are noted in the lungs bilaterally, largest of which is a 5 mm nodule in the left upper lobe (axial image 79 of series 3). No other larger more suspicious appearing pulmonary nodules or masses are noted. No pleural effusions. No acute consolidative airspace disease.  1. The appearance of the lungs is compatible with interstitial lung disease, with a stable spectrum of findings technically classified as probable usual interstitial pneumonia (UIP) per current ATS guidelines. However, the possibility of nonspecific interstitial pneumonia is not excluded. Repeat high-resolution chest CT is recommended in 12 months to assess for temporal changes in the appearance  of the lung parenchyma. 2. Tiny pulmonary nodules measuring 5 mm or less in size, stable compared to the prior study. These are nonspecific, but statistically benign. Attention at time of repeat high-resolution chest CT is recommended to ensure continued stability.   Electronically Signed   By: Trudie Reed M.D.   On: 03/14/2019 12:56 ROS - per HPI   OV 07/31/2019  Subjective:  Patient ID: [name], female , DOB: 1975/05/21 , age 28 y.o. , MRN: [id number] , ADDRESS: [address] [place] [address]   07/31/2019 -   Chief Complaint  Patient presents with  . 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 -returns for follow-up.  Since her last visit her symptoms are similar.  She reports better acid reflux control.  She had pulmonary function test that compared to a year ago shows decline documented below but she herself is feeling fine.  Her walking desaturation test is stable.  Her CT scan of the chest between August 2020 in December 2020 is stable.  She is currently on Imuran and is tolerating it well.  This is being monitored by Joanne. Algis Downs.  There are no other new issues.  She reports she had echocardiogram earlier in the year and this was normal.    OV 01/29/2020   Subjective:  Patient ID: [name], female , DOB: 1975/08/29, age 83 y.o. years. , MRN: [id number],  ADDRESS: [address] [place] [address] PCP  Shirlean Mylar, MD Providers : Treatment Team:  Attending Provider: Kalman Shan, MD Patient Care Team: Shirlean Mylar, MD as PCP - General (Family Medicine)    Chief Complaint  Patient presents with  . 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 -presents for follow-up.  She continues on  Imuran.  She has had a flu shot.  She plans to have a Covid booster.  She continues to be asymptomatic/minimally symptomatic as documented below.  Walking desaturation test is stable.  She had pulmonary function test that compared to the spring of this year shows stable FVC but a slight drop in DLCO.  However there is no change in her walking desaturation test or her symptoms.  The quality of the PFT appears good.  Overall is a decline PFT since last summer but in the interim she has been on Imuran which seems to be helping her.  She has no side effects from Imuran.  She is a little concerned about the drop in DLCO but she is  willing to work with our plan.     SYMPTOM SCALE - ILD 05/24/2019  07/31/2019  01/29/2020 Last Weight  Most recent update: 01/29/2020  2:12 PM   Weight  67.6 kg (149 lb)             O2 use ra ra ra  Shortness of Breath 0 -> 5 scale with 5 being worst (score 6 If unable to do)    At rest 0 0 0  Simple tasks - showers, clothes change, eating, shaving 1 0 0  Household (dishes, doing bed, laundry) 1 0 1  Shopping 0 0 0  Walking level at own pace 1 0 0  Walking up Stairs 1 2 2   Total (30-36) Dyspnea Score 4 2 2   How bad is your cough? 2 1 0  How bad is your fatigue 1 0 0  How bad is nausea 00 0 0  How bad is vomiting?  0 0   How bad is diarrhea? 0 0 0  How bad is anxiety? 0 0 0  How bad is depression 0 0 0      Simple office walk 185 feet x  3 laps goal with forehead probe 05/24/2019  07/31/2019  ra  O2 used ra ra   Number laps completed 3 3   Comments about pace avg Mod pace   Resting Pulse Ox/HR 100% and 89/min 100% and 71 100% and 89/min  Final Pulse Ox/HR 99% and 102/min 99% and 85 98% and 107/min  Desaturated </= 88% no    Desaturated <= 3% points no    Got Tachycardic >/= 90/min yes    Symptoms at end of test Very mild dyspnea  No dysonea  Miscellaneous comments x       PFT Results Latest Ref Rng & Units 01/29/2020 07/31/2019 10/09/2018  FVC-Pre L  1.70 1.71 -  FVC-Predicted Pre % 59 59 65  FVC-Post L - - 1.88  FVC-Predicted Post % - - 65  Pre FEV1/FVC % % 93 89 84  Post FEV1/FCV % % - - 92  FEV1-Pre L 1.58 1.52 1.57  FEV1-Predicted Pre % 67 64 67  FEV1-Post L - - 1.73  DLCO uncorrected ml/min/mmHg 12.72 13.00 15.54  DLCO UNC% % 61 62 75  DLCO corrected ml/min/mmHg 13.43 13.00 -  DLCO COR %Predicted % 64 62 -  DLVA Predicted % 125 113 128  TLC L - - 3.07  TLC % Predicted % - - 63  RV % Predicted % - - 74      ROS    has a past medical history of Abnormal Pap smear, Dysplasia of cervix, low grade (CIN 1), H/O: pneumonia, Hypothyroidism, and Rheumatoid arthritis (HCC).   reports that she has never smoked. She has never used smokeless tobacco.  Past Surgical History:  Procedure Laterality Date  . CESAREAN SECTION    . LAPAROSCOPIC TUBAL LIGATION  02/01/2011   Procedure: LAPAROSCOPIC TUBAL LIGATION;  Surgeon: Purcell Nails, MD;  Location: [facility];  Service: Gynecology;  Laterality: Bilateral;  . LEEP      Allergies  Allergen Reactions  . Other     Cats Fruit     Immunization History  Administered Date(s) Administered  . Influenza,inj,Quad PF,6+ Mos 12/29/2018  . PFIZER SARS-COV-2 Vaccination 05/25/2019, 06/15/2019    Family History  Problem Relation Age of Onset  . Hypertension Mother   . Diabetes Mother   . Hypertension Father   . Rheum arthritis Sister   .  Hypertension Brother   . Healthy Son   . Healthy Daughter   . Rheum arthritis Maternal Aunt   . Rheum arthritis Maternal Aunt      Current Outpatient Medications:  .  azaTHIOprine (IMURAN) 50 MG tablet, Take 1.5 tablets (75 mg total) by mouth daily., Disp: 135 tablet, Rfl: 0 .  calcium carbonate (TUMS - DOSED IN MG ELEMENTAL CALCIUM) 500 MG chewable tablet, Chew 2 tablets by mouth as needed. , Disp: , Rfl:  .  Calcium-Vitamin D-Vitamin K (VIACTIV CALCIUM PLUS D) 650-12.5-40 MG-MCG-MCG CHEW, daily., Disp: , Rfl:  .  levothyroxine (SYNTHROID,  LEVOTHROID) 50 MCG tablet, Take 50 mcg by mouth daily.  , Disp: , Rfl:  .  loratadine (CLARITIN) 10 MG tablet, TAKE 1 TABLET BY MOUTH EVERY DAY, Disp: 90 tablet, Rfl: 1 .  Multiple Vitamin (MULTIVITAMIN WITH MINERALS) TABS tablet, Take 1 tablet by mouth daily., Disp: , Rfl:  .  omeprazole (PRILOSEC) 20 MG capsule, Take 20 mg by mouth daily., Disp: , Rfl:  .  Turmeric 1053 MG TABS, daily., Disp: , Rfl:       Objective:   Vitals:   01/29/20 1409  BP: 128/72  Pulse: 89  Temp: 98.3 F (36.8 C)  TempSrc: Other (Comment)  SpO2: 100%  Weight: 149 lb (67.6 kg)  Height: 5\' 3"  (1.6 m)    Estimated body mass index is 26.39 kg/m as calculated from the following:   Height as of this encounter: 5\' 3"  (1.6 m).   Weight as of this encounter: 149 lb (67.6 kg).  @WEIGHTCHANGE @  Filed Weights   01/29/20 1409  Weight: 149 lb (67.6 kg)     Physical Exam General: No distress. plesant Neuro: Alert and Oriented x 3. GCS 15. Speech normal Psych: Pleasant Resp:  Barrel Chest - no.  Wheeze - no, Crackles - no, No overt respiratory distress CVS: Normal heart sounds. Murmurs - no Ext: Stigmata of Connective Tissue Disease - RA HEENT: Normal upper airway. PEERL +. No post nasal drip        Assessment:       ICD-10-CM   1. Interstitial lung disease due to connective tissue disease (HCC)  J84.89    M35.9        Plan:     Patient Instructions     ICD-10-CM   1. Interstitial lung disease due to connective tissue disease (HCC)  J84.89    M35.9     Interstitial lung disease likely due to rheumatoid arthritis with or without acid reflux -progressive phenotype compared to July 2020 with subsequent decline in lung function in May 2021  Pulmonary function test November 2021 is very similar in capacity compared to May 2021 but the efficiency/diffusion is slightly down compared to May 2021  How your symptoms and walk test continue to be stable compared to previous visit  It is likely that  you are continuing to be stable and the Imuran is helping you  Plan Continue control of acid reflux  -Sleep with head end of the bed elevated [best method is to elevate the head and using a breaker pillow at a 30 degree angle or even 20 degree angle]  - can do OTC prilosec 20mg  daily in empty stomach  -Continue Imuran that should help with joints and also has antifibrotic effect  -Do spirometry and DLCO in 3 months  -Do repeat high-resolution CT chest supine and prone in 3 months [last one was in December 2020]  Follow-up -Return in 3  months for a 30-minute encounter on ILD clinic or any other day  -  If there is evidence of continued progression then we can discuss antifibrotic therapy      SIGNATURE    Joanne. Kalman Shan, M.D., F.C.C.P,  Pulmonary and Critical Care Medicine Staff Physician, [facility] Center Director - Interstitial Lung Disease  Program  Pulmonary Fibrosis [facility] at [facility] [place], [place], [address]  Pager: [phone], If no answer or between  15:00h - 7:00h: call [phone] Telephone: [phone]  2:32 PM 01/29/2020

## 2020-01-29 NOTE — Research (Signed)
Title: Chronic Fibrosing Interstitial Lung Disease with Progressive Phenotype Prospective Outcomes (ILD-PRO) Registry   Protocol #: IPF-[id number], Clinical Trials # [id number], Sponsor: [employer]  Protocol Version Amendment 4 dated 12Sep2019 and confirmed yescurrent on 01/29/2020 Consent Version for today's visit date of11/16/2021is Version [id number]  Clinical Research Coordinator / Research RN note : This visit for SubjectVirginia D. McCainwith DOB: Jun 29, 1977on 11/16/2021for the above protocol is Visit/Encounter # 6 Month (Visit 1)and is for purpose of research. The consent for this encounter is under Protocol Version Amendment 4 ([id number])andiscurrently IRB approved. Subject expressed continued interest and consent in continuing as a study subject. Subject confirmed that there were nochangesin contact information (e.g. address, telephone, email). Subject thanked for participation in research and contribution to science. Refer to the subject's paper source binder for additional information on this visit.  During this visiton11/16/2021, the subject completed the blood work and questionnaires per the above referenced protocol. Please refer to the subject's paper source binder for further details  Signed by  Marc Morgans Clinical Research Coordinator [facility]  [place], [place] 12:21 PM11/16/2021

## 2020-01-29 NOTE — Addendum Note (Signed)
Addended by: Melonie Florida on: 01/29/2020 04:45 PM   Modules accepted: Orders

## 2020-01-29 NOTE — Patient Instructions (Addendum)
ICD-10-CM   1. Interstitial lung disease due to connective tissue disease (HCC)  J84.89    M35.9     Interstitial lung disease likely due to rheumatoid arthritis with or without acid reflux -progressive phenotype compared to July 2020 with subsequent decline in lung function in May 2021  Pulmonary function test November 2021 is very similar in capacity compared to May 2021 but the efficiency/diffusion is slightly down compared to May 2021   your symptoms and walk test continue to be stable compared to previous visit  It is likely that you are continuing to be stable and the Imuran is helping you. But we do need to keep an eye on the drop in diffusion capacity   Plan Continue control of acid reflux  -Sleep with head end of the bed elevated [best method is to elevate the head and using a breaker pillow at a 30 degree angle or even 20 degree angle]  - can do OTC prilosec 20mg  daily in empty stomach  -Continue Imuran that should help with joints and also has antifibrotic effect  -Do spirometry and DLCO in 3 months  -Do repeat high-resolution CT chest supine and prone in 3 months [last one was in December 2020]  Follow-up -Return in 3 months for a 30-minute encounter on ILD clinic or any other day  -  If there is evidence of continued progression then we can discuss antifibrotic therapy

## 2020-03-13 NOTE — Progress Notes (Signed)
Office Visit Note  Patient: [name]             Date of Birth: [birth year]           MRN: [id number]             PCP: Maurice Small, MD Referring: Maurice Small, MD Visit Date: 03/27/2020 Occupation: _0 @  Subjective:  Medication Management   History of Present Illness: [name] is a 44 y.o. female history of rheumatoid arthritis and interstitial lung disease.  She was evaluated by Dr. Chase Caller in November 2021.  He did not find any deterioration of pulmonary function test and November 2021 when compared to the May 2021.  He was pleased with the response to Imuran.  He also discussed antireflux measures.  Over-the-counter Prilosec was also discussed which patient is started taking.  She will get repeat DLCO in 3 months and repeat high-resolution CT in 3 months after the visit.  Her arthritis is very well controlled on Imuran.  She denies any joint pain or joint swelling.  Activities of Daily Living:  Patient reports morning stiffness for 0 minutes.   Patient Denies nocturnal pain.  Difficulty dressing/grooming: Denies Difficulty climbing stairs: Denies Difficulty getting out of chair: Denies Difficulty using hands for taps, buttons, cutlery, and/or writing: Denies  Review of Systems  Constitutional: Negative for fatigue.  HENT: Negative for mouth sores, mouth dryness and nose dryness.   Eyes: Negative for pain, itching and dryness.  Respiratory: Negative for shortness of breath and difficulty breathing.   Cardiovascular: Negative for chest pain and palpitations.  Gastrointestinal: Negative for blood in stool, constipation and diarrhea.  Endocrine: Negative for increased urination.  Genitourinary: Negative for difficulty urinating.  Musculoskeletal: Negative for arthralgias, joint pain, joint swelling, myalgias, morning stiffness, muscle tenderness and myalgias.  Skin: Negative for color change, rash and redness.  Allergic/Immunologic: Negative for susceptible  to infections.  Neurological: Positive for headaches. Negative for dizziness, numbness, memory loss and weakness.  Hematological: Negative for bruising/bleeding tendency.  Psychiatric/Behavioral: Negative for confusion.    PMFS History:  Patient Active Problem List   Diagnosis Date Noted  . ILD (interstitial lung disease) ([place]) 02/20/2019  . Restrictive lung disease 10/09/2018  . Chronic cough 09/07/2018  . RA (rheumatoid arthritis) ([place]) 09/07/2018  . H/O: pneumonia   . Dysplasia of cervix, low grade (CIN 1 and 2)     Past Medical History:  Diagnosis Date  . Abnormal Pap smear   . Dysplasia of cervix, low grade (CIN 1)   . H/O: pneumonia   . Hypothyroidism   . Rheumatoid arthritis ([place])     Family History  Problem Relation Age of Onset  . Hypertension Mother   . Diabetes Mother   . Stroke Mother   . Hypertension Father   . Rheum arthritis Sister   . Hypertension Brother   . Healthy Son   . Healthy Daughter   . Rheum arthritis Maternal Aunt   . Rheum arthritis Maternal Aunt    Past Surgical History:  Procedure Laterality Date  . CESAREAN SECTION    . LAPAROSCOPIC TUBAL LIGATION  02/01/2011   Procedure: LAPAROSCOPIC TUBAL LIGATION;  Surgeon: Delice Lesch, MD;  Location: [facility];  Service: Gynecology;  Laterality: Bilateral;  . LEEP     Social History   Social History Narrative  . Not on file   Immunization History  Administered Date(s) Administered  . Influenza,inj,Quad PF,6+ Mos 12/29/2018  . PFIZER SARS-COV-2 Vaccination 05/25/2019, 06/15/2019,  02/01/2020     Objective: Vital Signs: BP (!) 144/97 (BP Location: Left Arm, Patient Position: Sitting, Cuff Size: Normal)   Pulse 84   Resp 15   Ht _0  (1.549 m)   Wt 154 lb 12.8 oz (70.2 kg)   LMP 08/26/2013   BMI 29.25 kg/m    Physical Exam Vitals and nursing note reviewed.  Constitutional:      Appearance: She is well-developed and well-nourished.  HENT:     Head: Normocephalic and atraumatic.   Eyes:     Extraocular Movements: EOM normal.     Conjunctiva/sclera: Conjunctivae normal.  Cardiovascular:     Rate and Rhythm: Normal rate and regular rhythm.     Pulses: Intact distal pulses.     Heart sounds: Normal heart sounds.  Pulmonary:     Effort: Pulmonary effort is normal.     Breath sounds: Normal breath sounds.  Abdominal:     General: Bowel sounds are normal.     Palpations: Abdomen is soft.  Musculoskeletal:     Cervical back: Normal range of motion.  Lymphadenopathy:     Cervical: No cervical adenopathy.  Skin:    General: Skin is warm and dry.     Capillary Refill: Capillary refill takes less than 2 seconds.  Neurological:     Mental Status: She is alert and oriented to person, place, and time.  Psychiatric:        Mood and Affect: Mood and affect normal.        Behavior: Behavior normal.      Musculoskeletal Exam: C-spine was in good range of motion.  Shoulder joints, elbow joints, wrist joints, MCPs PIPs and DIPs with good range of motion with no synovitis.  Hip joints, knee joints, ankles, MTPs and PIPs with good range of motion with no synovitis.  CDAI Exam: CDAI Score: 0.4  Patient Global: 2 mm; Provider Global: 2 mm Swollen: 0 ; Tender: 0  Joint Exam 03/27/2020   No joint exam has been documented for this visit   There is currently no information documented on the homunculus. Go to the Rheumatology activity and complete the homunculus joint exam.  Investigation: No additional findings.  Imaging: No results found.  Recent Labs: Lab Results  Component Value Date   WBC 4.5 12/19/2019   HGB 11.8 12/19/2019   PLT 280 12/19/2019   NA 141 12/19/2019   K 3.8 12/19/2019   CL 102 12/19/2019   CO2 28 12/19/2019   GLUCOSE 80 12/19/2019   BUN 13 12/19/2019   CREATININE 0.73 12/19/2019   BILITOT 0.6 12/19/2019   ALKPHOS 92 07/07/2007   AST 36 (H) 12/19/2019   ALT 16 12/19/2019   PROT 8.7 (H) 12/19/2019   ALBUMIN 1.8 (L) 07/07/2007   CALCIUM  10.1 12/19/2019   GFRAA 115 12/19/2019   QFTBGOLDPLUS NEGATIVE 03/07/2019    Speciality Comments: Treated with methotrexate (December 2019-February 2021)which was stopped due to shortness of breath. Imuran and started in February 202  Procedures:  No procedures performed Allergies: Other   Assessment / Plan:     Visit Diagnoses: Rheumatoid arthritis involving multiple sites with positive rheumatoid factor (HCC) - Positive RF, +_1 eta and elevated ESR.  Diagnosed in 2019. MTX(12/19-02/21)-dcd after shortness of breath.  She has been on Imuran since February 2021 and has been tolerating it well.  She has no joint pain or joint swelling currently.  High risk medication use - Imuran 75 mg po qd (started in  February 2021), previously on MTX 4 tablets by mouth once weekly and folic acid 1 mg po qd (started in 02/2018-d/c February 2021) - Plan: CBC with Differential/Platelet, COMPLETE METABOLIC PANEL WITH GFR today and then every 3 months to monitor for drug toxicity.  She is fully vaccinated against COVID-19.  She also received her booster.  Use of mask, social distancing and hand hygiene was discussed.  Advised to follow-up with [email or web address] and CDC guidelines for future recommendations.  Positive ANA (antinuclear antibody) - ANA 1: 1280 speckled, positive Ro antibody: She has no other clinical features of Sjogren's.  Raynaud's syndrome without gangrene-she denies any renal symptoms currently.  ILD (interstitial lung disease) ([place]) - High-resolution chest CT was obtained on 03/14/2019.  She is followed by Dr. Chase Caller.  I reviewed his records from November 2021.  She will be getting repeat DLCO and high-resolution CT next month.  Elevated LFTs-LFTs are improving gradually.  She will get labs today.  Gastroesophageal reflux disease without esophagitis-she is on Prilosec.  She is also due practicing antireflux measures.  History of hyperlipidemia-dietary modifications were discussed.   Increased risk of heart disease with rheumatoid arthritis was discussed.  History of hypothyroidism  Thyroid goiter  Vitamin D deficiency  H/O: pneumonia  Dysplasia of cervix, low grade (CIN 1 and 2)  Orders: Orders Placed This Encounter  Procedures  . CBC with Differential/Platelet  . COMPLETE METABOLIC PANEL WITH GFR   No orders of the defined types were placed in this encounter.    Follow-Up Instructions: Return in about 3 months (around 06/25/2020) for Rheumatoid arthritis, ILD.   Bo Merino, MD  Note - This record has been created using Editor, commissioning.  Chart creation errors have been sought, but may not always  have been located. Such creation errors do not reflect on  the standard of medical care.

## 2020-03-27 ENCOUNTER — Encounter: Payer: Self-pay | Admitting: Rheumatology

## 2020-03-27 ENCOUNTER — Ambulatory Visit (INDEPENDENT_AMBULATORY_CARE_PROVIDER_SITE_OTHER): Payer: 59 | Admitting: Rheumatology

## 2020-03-27 ENCOUNTER — Other Ambulatory Visit: Payer: Self-pay

## 2020-03-27 VITALS — BP 144/97 | HR 84 | Resp 15 | Ht 61.0 in | Wt 154.8 lb

## 2020-03-27 DIAGNOSIS — Z8701 Personal history of pneumonia (recurrent): Secondary | ICD-10-CM

## 2020-03-27 DIAGNOSIS — M0579 Rheumatoid arthritis with rheumatoid factor of multiple sites without organ or systems involvement: Secondary | ICD-10-CM | POA: Diagnosis not present

## 2020-03-27 DIAGNOSIS — R768 Other specified abnormal immunological findings in serum: Secondary | ICD-10-CM

## 2020-03-27 DIAGNOSIS — E049 Nontoxic goiter, unspecified: Secondary | ICD-10-CM

## 2020-03-27 DIAGNOSIS — Z79899 Other long term (current) drug therapy: Secondary | ICD-10-CM | POA: Diagnosis not present

## 2020-03-27 DIAGNOSIS — J849 Interstitial pulmonary disease, unspecified: Secondary | ICD-10-CM

## 2020-03-27 DIAGNOSIS — N870 Mild cervical dysplasia: Secondary | ICD-10-CM

## 2020-03-27 DIAGNOSIS — I7300 Raynaud's syndrome without gangrene: Secondary | ICD-10-CM | POA: Diagnosis not present

## 2020-03-27 DIAGNOSIS — Z8639 Personal history of other endocrine, nutritional and metabolic disease: Secondary | ICD-10-CM

## 2020-03-27 DIAGNOSIS — E559 Vitamin D deficiency, unspecified: Secondary | ICD-10-CM

## 2020-03-27 DIAGNOSIS — K219 Gastro-esophageal reflux disease without esophagitis: Secondary | ICD-10-CM

## 2020-03-27 DIAGNOSIS — R7989 Other specified abnormal findings of blood chemistry: Secondary | ICD-10-CM

## 2020-03-27 NOTE — Patient Instructions (Signed)
Standing Labs We placed an order today for your standing lab work.   Please have your standing labs drawn in April and every 3 months   If possible, please have your labs drawn 2 weeks prior to your appointment so that the provider can discuss your results at your appointment.  We have open lab daily Monday through Thursday from 8:30-12:30 PM and 1:30-4:30 PM and Friday from 8:30-12:30 PM and 1:30-4:00 PM at the office of Dr. Kinzie Wickes, [facility].   Please be advised, patients with office appointments requiring lab work will take precedents over walk-in lab work.  If possible, please come for your lab work on Monday and Friday afternoons, as you may experience shorter wait times. The office is located at [address], [address], [place], [place] [address] No appointment is necessary.   Labs are drawn by [facility]. Please bring your co-pay at the time of your lab draw.  You may receive a bill from [facility] for your lab work.  If you wish to have your labs drawn at another location, please call the office 24 hours in advance to send orders.  If you have any questions regarding directions or hours of operation,  please call [phone].   As a reminder, please drink plenty of water prior to coming for your lab work. Thanks!   

## 2020-03-28 LAB — CBC WITH DIFFERENTIAL/PLATELET
Absolute Monocytes: 532 cells/uL (ref 200–950)
Basophils Absolute: 30 cells/uL (ref 0–200)
Basophils Relative: 0.8 %
Eosinophils Absolute: 141 cells/uL (ref 15–500)
Eosinophils Relative: 3.7 %
HCT: 34.2 % — ABNORMAL LOW (ref 35.0–45.0)
Hemoglobin: 11.2 g/dL — ABNORMAL LOW (ref 11.7–15.5)
Lymphs Abs: 1303 cells/uL (ref 850–3900)
MCH: 27.9 pg (ref 27.0–33.0)
MCHC: 32.7 g/dL (ref 32.0–36.0)
MCV: 85.3 fL (ref 80.0–100.0)
MPV: 10.8 fL (ref 7.5–12.5)
Monocytes Relative: 14 %
Neutro Abs: 1794 cells/uL (ref 1500–7800)
Neutrophils Relative %: 47.2 %
Platelets: 303 10*3/uL (ref 140–400)
RBC: 4.01 10*6/uL (ref 3.80–5.10)
RDW: 13.5 % (ref 11.0–15.0)
Total Lymphocyte: 34.3 %
WBC: 3.8 10*3/uL (ref 3.8–10.8)

## 2020-03-28 LAB — COMPLETE METABOLIC PANEL WITH GFR
AG Ratio: 1.2 (calc) (ref 1.0–2.5)
ALT: 18 U/L (ref 6–29)
AST: 40 U/L — ABNORMAL HIGH (ref 10–35)
Albumin: 4.4 g/dL (ref 3.6–5.1)
Alkaline phosphatase (APISO): 55 U/L (ref 31–125)
BUN: 10 mg/dL (ref 7–25)
CO2: 30 mmol/L (ref 20–32)
Calcium: 9.8 mg/dL (ref 8.6–10.2)
Chloride: 105 mmol/L (ref 98–110)
Creat: 0.65 mg/dL (ref 0.50–1.10)
GFR, Est African American: 124 mL/min/{1.73_m2} (ref >=60)
GFR, Est Non African American: 107 mL/min/{1.73_m2} (ref >=60)
Globulin: 3.7 g/dL (calc) (ref 1.9–3.7)
Glucose, Bld: 90 mg/dL (ref 65–99)
Potassium: 4.3 mmol/L (ref 3.5–5.3)
Sodium: 141 mmol/L (ref 135–146)
Total Bilirubin: 0.5 mg/dL (ref 0.2–1.2)
Total Protein: 8.1 g/dL (ref 6.1–8.1)

## 2020-03-28 NOTE — Progress Notes (Signed)
Mild anemia persists.  AST is a still elevated.  No change in therapy advised.  We will continue to monitor labs.

## 2020-05-01 ENCOUNTER — Ambulatory Visit (INDEPENDENT_AMBULATORY_CARE_PROVIDER_SITE_OTHER)
Admission: RE | Admit: 2020-05-01 | Discharge: 2020-05-01 | Disposition: A | Discharge status: Home / Self Care | Payer: 59 | Source: Ambulatory Visit | Attending: Internal Medicine | Admitting: Internal Medicine

## 2020-05-01 ENCOUNTER — Other Ambulatory Visit: Payer: Self-pay

## 2020-05-01 DIAGNOSIS — J849 Interstitial pulmonary disease, unspecified: Secondary | ICD-10-CM

## 2020-05-03 ENCOUNTER — Other Ambulatory Visit: Payer: Self-pay | Admitting: Physician Assistant

## 2020-05-05 NOTE — Telephone Encounter (Signed)
Last Visit: 03/27/2020 Next Visit: 06/26/2020 Labs: 03/27/2020 Mild anemia persists. AST is a still elevated.   Current Dose per office note 03/27/2020: Imuran 75 mg po qd  DX: Rheumatoid arthritis involving multiple sites with positive rheumatoid factor   Last Fill: 01/15/2020  Okay to refill Imuran?

## 2020-05-11 ENCOUNTER — Telehealth: Payer: Self-pay | Admitting: Internal Medicine

## 2020-05-11 DIAGNOSIS — M359 Systemic involvement of connective tissue, unspecified: Secondary | ICD-10-CM

## 2020-05-11 DIAGNOSIS — J849 Interstitial pulmonary disease, unspecified: Secondary | ICD-10-CM

## 2020-05-11 DIAGNOSIS — J8489 Other specified interstitial pulmonary diseases: Secondary | ICD-10-CM

## 2020-05-11 NOTE — Telephone Encounter (Signed)
Last seen nov 2021 with plans for CT now and followup. CT done 05/01/20 shows persistent pulmonary fibrosis but I See no followup  Plan  -spiro/dlco in April 2022  - see MR in 30 min slot in April 2022

## 2020-05-15 NOTE — Telephone Encounter (Signed)
Attempted to call pt to get her scheduled for PFT and OV with MR but unable to reach. Left her a detailed message for her to return call so we can get appts scheduled for her.   When pt calls back, please schedule pt 1 hour PFT appt followed by OV with MR in April 2022. Pt is an ILD pt.

## 2020-05-22 NOTE — Telephone Encounter (Signed)
Called and spoke with pt about the info stated by MR. Have scheduled pt for covid test prior to the PFT and also scheduled pt the OV with MR after PFT. Nothing further needed.

## 2020-06-12 ENCOUNTER — Encounter: Payer: Self-pay | Admitting: Physician Assistant

## 2020-06-12 ENCOUNTER — Ambulatory Visit: Payer: Self-pay

## 2020-06-12 ENCOUNTER — Other Ambulatory Visit: Payer: Self-pay

## 2020-06-12 ENCOUNTER — Ambulatory Visit (INDEPENDENT_AMBULATORY_CARE_PROVIDER_SITE_OTHER): Payer: 59 | Admitting: Physician Assistant

## 2020-06-12 VITALS — BP 147/92 | HR 67 | Resp 14 | Ht 61.0 in | Wt 155.6 lb

## 2020-06-12 DIAGNOSIS — M5459 Other low back pain: Secondary | ICD-10-CM | POA: Diagnosis not present

## 2020-06-12 DIAGNOSIS — M7062 Trochanteric bursitis, left hip: Secondary | ICD-10-CM | POA: Diagnosis not present

## 2020-06-12 DIAGNOSIS — J849 Interstitial pulmonary disease, unspecified: Secondary | ICD-10-CM

## 2020-06-12 DIAGNOSIS — M533 Sacrococcygeal disorders, not elsewhere classified: Secondary | ICD-10-CM

## 2020-06-12 DIAGNOSIS — E559 Vitamin D deficiency, unspecified: Secondary | ICD-10-CM

## 2020-06-12 DIAGNOSIS — E049 Nontoxic goiter, unspecified: Secondary | ICD-10-CM

## 2020-06-12 DIAGNOSIS — M545 Low back pain, unspecified: Secondary | ICD-10-CM

## 2020-06-12 DIAGNOSIS — I7300 Raynaud's syndrome without gangrene: Secondary | ICD-10-CM

## 2020-06-12 DIAGNOSIS — Z8639 Personal history of other endocrine, nutritional and metabolic disease: Secondary | ICD-10-CM

## 2020-06-12 DIAGNOSIS — R7989 Other specified abnormal findings of blood chemistry: Secondary | ICD-10-CM

## 2020-06-12 DIAGNOSIS — M0579 Rheumatoid arthritis with rheumatoid factor of multiple sites without organ or systems involvement: Secondary | ICD-10-CM | POA: Diagnosis not present

## 2020-06-12 DIAGNOSIS — G8929 Other chronic pain: Secondary | ICD-10-CM

## 2020-06-12 DIAGNOSIS — Z8701 Personal history of pneumonia (recurrent): Secondary | ICD-10-CM

## 2020-06-12 DIAGNOSIS — R768 Other specified abnormal immunological findings in serum: Secondary | ICD-10-CM

## 2020-06-12 DIAGNOSIS — Z79899 Other long term (current) drug therapy: Secondary | ICD-10-CM | POA: Diagnosis not present

## 2020-06-12 DIAGNOSIS — K219 Gastro-esophageal reflux disease without esophagitis: Secondary | ICD-10-CM

## 2020-06-12 DIAGNOSIS — N870 Mild cervical dysplasia: Secondary | ICD-10-CM

## 2020-06-12 MED ORDER — METHOCARBAMOL 500 MG PO TABS: 500.0000 mg | ORAL_TABLET | Freq: Two times a day (BID) | ORAL | 0 refills | Status: DC | PRN

## 2020-06-12 MED ORDER — LIDOCAINE HCL 1 % IJ SOLN
1.5000 mL | INTRAMUSCULAR | Status: AC | PRN
  Administered 2020-06-12: 1.5 mL

## 2020-06-12 MED ORDER — TRIAMCINOLONE ACETONIDE 40 MG/ML IJ SUSP
40.0000 mg | INTRAMUSCULAR | Status: AC | PRN
  Administered 2020-06-12: 40 mg via INTRA_ARTICULAR

## 2020-06-12 MED ORDER — PREDNISONE 5 MG PO TABS: ORAL_TABLET | ORAL | 0 refills | Status: DC

## 2020-06-12 NOTE — Progress Notes (Signed)
Office Visit Note  Patient: [name]             Date of Birth: 02-27-1976           MRN: [id number]             PCP: Maurice Small, MD Referring: Maurice Small, MD Visit Date: 06/12/2020 Occupation: @GUAROCC @  Subjective:  Low back pain   History of Present Illness: [name] is a 45 y.o. female with history of seropositive rheumatoid arthritis and ILD.  Patient is taking Imuran 75 mg by mouth daily.  She continues to tolerate Imuran without any side effects.  She denies any recent rheumatoid arthritis flares.  She is not having any joint swelling at this time.  She continues to take turmeric on a daily basis.  She states that she has an upcoming appointment with Dr. Chase Caller on 07/08/2020 at which time she will update PFTs.  She had a routine high-resolution chest CT on 05/01/2020 which was stable.  She has not had any new or worsening pulmonary symptoms.  She denies any shortness of breath at this time.  She has not had any symptoms of Raynaud's recently. She presents today with left-sided lower back pain which started Tuesday morning.  According to the patient she woke up in the morning with discomfort in her lower back.  She denies any injuries prior to onset of symptoms.  She states that she tried to do her normal exercise regimen that morning but her discomfort progressively worsened.  She has been experiencing nocturnal pain.  Her discomfort is exacerbated by walking and is better when resting.  She denies any radiating pain or numbness at this time.  She denies any weakness in her left lower extremity.  She has tried applying heat, ice, and CBD topically.  She has also tried taking ibuprofen for pain relief.  She has not had any fevers, bowel or bladder incontinence, or saddle paresthesia.  Activities of Daily Living:  Patient reports morning stiffness for 5 minutes.   Patient Denies nocturnal pain.  Difficulty dressing/grooming: Reports Difficulty climbing stairs:  Denies Difficulty getting out of chair: Denies Difficulty using hands for taps, buttons, cutlery, and/or writing: Denies  Review of Systems  Constitutional: Negative for fatigue.  HENT: Positive for mouth dryness. Negative for mouth sores and nose dryness.   Eyes: Negative for pain, itching and dryness.  Respiratory: Negative for shortness of breath and difficulty breathing.   Cardiovascular: Negative for chest pain and palpitations.  Gastrointestinal: Negative for blood in stool, constipation and diarrhea.  Endocrine: Negative for increased urination.  Genitourinary: Negative for difficulty urinating.  Musculoskeletal: Positive for arthralgias, joint pain, joint swelling, myalgias, morning stiffness, muscle tenderness and myalgias.  Skin: Negative for color change, rash and redness.  Allergic/Immunologic: Negative for susceptible to infections.  Neurological: Positive for headaches. Negative for dizziness, numbness, memory loss and weakness.  Hematological: Negative for bruising/bleeding tendency.  Psychiatric/Behavioral: Negative for confusion.    PMFS History:  Patient Active Problem List   Diagnosis Date Noted  . ILD (interstitial lung disease) ([place]) 02/20/2019  . Restrictive lung disease 10/09/2018  . Chronic cough 09/07/2018  . RA (rheumatoid arthritis) ([place]) 09/07/2018  . H/O: pneumonia   . Dysplasia of cervix, low grade (CIN 1 and 2)     Past Medical History:  Diagnosis Date  . Abnormal Pap smear   . Dysplasia of cervix, low grade (CIN 1)   . H/O: pneumonia   . Hypothyroidism   .  Rheumatoid arthritis ([place])     Family History  Problem Relation Age of Onset  . Hypertension Mother   . Diabetes Mother   . Stroke Mother   . Hypertension Father   . Rheum arthritis Sister   . Hypertension Brother   . Healthy Son   . Healthy Daughter   . Rheum arthritis Maternal Aunt   . Rheum arthritis Maternal Aunt    Past Surgical History:  Procedure Laterality Date  .  CESAREAN SECTION    . LAPAROSCOPIC TUBAL LIGATION  02/01/2011   Procedure: LAPAROSCOPIC TUBAL LIGATION;  Surgeon: Delice Lesch, MD;  Location: [facility];  Service: Gynecology;  Laterality: Bilateral;  . LEEP     Social History   Social History Narrative  . Not on file   Immunization History  Administered Date(s) Administered  . Influenza,inj,Quad PF,6+ Mos 12/29/2018  . PFIZER(Purple Top)SARS-COV-2 Vaccination 05/25/2019, 06/15/2019, 02/01/2020     Objective: Vital Signs: BP (!) 147/92 (BP Location: Left Arm, Patient Position: Sitting, Cuff Size: Normal)   Pulse 67   Resp 14   Ht $R'5\' 1"'Ks$  (1.549 m)   Wt 155 lb 9.6 oz (70.6 kg)   LMP 08/26/2013   BMI 29.40 kg/m    Physical Exam Vitals and nursing note reviewed.  Constitutional:      Appearance: She is well-developed.  HENT:     Head: Normocephalic and atraumatic.  Eyes:     Conjunctiva/sclera: Conjunctivae normal.  Pulmonary:     Effort: Pulmonary effort is normal.  Abdominal:     Palpations: Abdomen is soft.  Musculoskeletal:     Cervical back: Normal range of motion.  Skin:    General: Skin is warm and dry.     Capillary Refill: Capillary refill takes less than 2 seconds.  Neurological:     Mental Status: She is alert and oriented to person, place, and time.  Psychiatric:        Behavior: Behavior normal.      Musculoskeletal Exam: C-spine has limited range of motion with lateral rotation.  Painful range of motion of the lumbar spine.  No midline spinal tenderness.  Tenderness over the left SI joint noted.  She has some tenderness over the left paraspinal muscle in the lumbar region.  Shoulder joints, elbow joints, wrist joints, MCPs, PIPs, DIPs have good range of motion with no synovitis.  She has complete fist formation bilaterally.  Hip joints have good range of motion with no discomfort.  She has tenderness palpation over the left trochanteric bursa.  Knee joints have good range of motion with no warmth or  effusion.  Ankle joints have good range of motion with no tenderness or inflammation.   CDAI Exam: CDAI Score: 0.4  Patient Global: 2 mm; Provider Global: 2 mm Swollen: 0 ; Tender: 0  Joint Exam 06/12/2020   No joint exam has been documented for this visit   There is currently no information documented on the homunculus. Go to the Rheumatology activity and complete the homunculus joint exam.  Investigation: No additional findings.  Imaging: No results found.  Recent Labs: Lab Results  Component Value Date   WBC 3.8 03/27/2020   HGB 11.2 (L) 03/27/2020   PLT 303 03/27/2020   NA 141 03/27/2020   K 4.3 03/27/2020   CL 105 03/27/2020   CO2 30 03/27/2020   GLUCOSE 90 03/27/2020   BUN 10 03/27/2020   CREATININE 0.65 03/27/2020   BILITOT 0.5 03/27/2020   ALKPHOS 92 07/07/2007  AST 40 (H) 03/27/2020   ALT 18 03/27/2020   PROT 8.1 03/27/2020   ALBUMIN 1.8 (L) 07/07/2007   CALCIUM 9.8 03/27/2020   GFRAA 124 03/27/2020   QFTBGOLDPLUS NEGATIVE 03/07/2019    Speciality Comments: Treated with methotrexate (December 2019-February 2021)which was stopped due to shortness of breath. Imuran and started in February 2021  Procedures:  Large Joint Inj on 06/12/2020 9:59 AM Indications: pain Details: 27 G 1.5 in needle, lateral approach  Arthrogram: No  Medications: 40 mg triamcinolone acetonide 40 MG/ML; 1.5 mL lidocaine 1 % Aspirate: 0 mL Outcome: tolerated well, no immediate complications Procedure, treatment alternatives, risks and benefits explained, specific risks discussed. Consent was given by the patient. Immediately prior to procedure a time out was called to verify the correct patient, procedure, equipment, support staff and site/side marked as required. Patient was prepped and draped in the usual sterile fashion.     Allergies: Other   Assessment / Plan:     Visit Diagnoses: Rheumatoid arthritis involving multiple sites with positive rheumatoid factor (HCC) -  Positive RF, +$RemoveBefor'14 3 3 'RsBYsbBtMRyq$ eta and elevated ESR.  Diagnosed in 2019. MTX(12/19-02/21)-dcd after shortness of breath: She has no synovitis or joint tenderness on examination today.  She has not had any recent rheumatoid arthritis flares.  She is clinically doing well on Imuran 75 mg by mouth daily. She is tolerating imuran without any side effects.  Her morning stiffness has only been lasting for about 5 minutes daily and she has not had any nocturnal pain prior to her lower back pain starting 2 days ago.  She is not experiencing any other joint pain or inflammation at this time.  She will continue on imuran 75 mg by mouth daily.  She was advised to notify us if she develops signs or symptoms of a rheumatoid arthritis flare.  She will follow-up in the office in 5 months.  High risk medication use - Imuran 75 mg po qd (started in February 2021), previously on MTX 4 tablets by mouth once weekly and folic acid 1 mg po qd (started in 02/2018-d/c February 2021).  CBC and CMP were drawn on 03/27/2020.  Lab work was updated today while she was in the office.  Orders for CBC and CMP were released.  Her next lab work will be due in June/early July 2022.  Standing orders for CBC and CMP remain in place. - Plan: CBC with Differential/Platelet, COMPLETE METABOLIC PANEL WITH GFR She has not had any recent infections.   Positive ANA (antinuclear antibody) - ANA 1: 1280 speckled, positive Ro antibody: She has no clinical features of autoimmune disease at this time.  She is not experiencing any sicca symptoms.  She has not had any symptoms of Raynaud's recently.  Raynaud's syndrome without gangrene: Not currently active.  No digital ulcerations or signs of sclerodactyly at this time.   ILD (interstitial lung disease) ([place]) -She is not experiencing any new or worsening pulmonary symptoms.  She is clinically doing well taking imuran 45 mg by mouth daily.   High-resolution chest CT was obtained on 05/01/20: Unchanged mild pulmonary  fibrosis and pattern featuring apical to basal gradient, predominantly characterized by irregular peripheral interstitial opacity and groundglass with minimal subpleural bronchiectasis particularly in the left lung base.  No significant air trapping or expiratory phase imaging.  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  She has updating PFTs and an office visit with Dr. Chase Caller on 07/08/20.    Acute left-sided low back pain  without sciatica - She presents today with left sided lower back pain, which started 2 days ago.she did not have any injury prior to the onset of symptoms.  According to the patient she woke up in the morning and was experiencing left-sided lower back pain.  She performed her normal morning exercise routine but her discomfort has progressively been worsening since then. She has not had any fevers, bowel or bladder incontinence, or paresthesias.  She has been experiencing nocturnal pain and having to try to lay on her back or right side for relief.  Her discomfort is exacerbated by walking.  She has not noticed any numbness or weakness in her left lower extremity.  She has been experiencing muscle spasms intermittently.  Lumbar spine and Pelvis x-rays were performed today.  On examination today she does not have any midline spinal tenderness.  She has tenderness over the left SI joint and left trochanteric bursa.  She is also having muscle tension and tenderness in the paraspinal muscles of the lumbar region. A salonpas patch was applied over the left SI joint today in the office. She was a given prescription for methocarbamol 500 mg 1 tablet twice daily as needed for muscle spasms. If her symptoms do not improve with methocarbamol she was advised to start taking prednisone 20 mg tapering by 5 mg every 2 days.  She will notify us if her symptoms persist or worsen.  Plan: XR Lumbar Spine 2-3 Views, XR Pelvis 1-2 Views  Chronic left SI joint pain -She has tenderness of patient  over the left SI joint.  She started to experience left-sided lower back pain 2 days ago with no injury prior to the onset of symptoms.  X-rays of the pelvis were obtained today. She was given a sample of salonpas, which was applied today in the office.  She was advised to notify us if her discomfort persists or worsens.  Plan: XR Pelvis 1-2 Views  Trochanteric bursitis, left hip - She has tenderness to palpation over the left trochanteric bursa.  She requested a left trochanteric bursa cortisone injection.  She tolerated procedure well.  Procedure note was completed above.  Aftercare was discussed.  She was advised to notify us if her discomfort persists or worsens.  She was given a handout of exercises to perform.  Plan: Large Joint Inj  Elevated LFTs: AST is borderline elevated-40. ALT is WNL.  CMP ordered today.    Vitamin D deficiency: She is taking a calcium and vitamin D supplement daily.  Other medical conditions are listed as follows:   History of hyperlipidemia  History of hypothyroidism  Gastroesophageal reflux disease without esophagitis  H/O: pneumonia  Thyroid goiter  Dysplasia of cervix, low grade (CIN 1 and 2)    Orders: Orders Placed This Encounter  Procedures  . Large Joint Inj  . XR Lumbar Spine 2-3 Views  . XR Pelvis 1-2 Views  . CBC with Differential/Platelet  . COMPLETE METABOLIC PANEL WITH GFR   Meds ordered this encounter  Medications  . predniSONE (DELTASONE) 5 MG tablet    Sig: Take 4 tablets by mouth daily x2, 3 tablets by mouth daily x2 days, 2 tablets by mouth daily x2 days, 1 tablet by mouth daily x2 days.    Dispense:  20 tablet    Refill:  0  . methocarbamol (ROBAXIN) 500 MG tablet    Sig: Take 1 tablet (500 mg total) by mouth 2 (two) times daily as needed for muscle spasms.  Dispense:  14 tablet    Refill:  0   Follow-Up Instructions: Return in about 5 months (around 11/12/2020) for Rheumatoid arthritis.   Ofilia Neas, PA-C  Note -  This record has been created using Dragon software.  Chart creation errors have been sought, but may not always  have been located. Such creation errors do not reflect on  the standard of medical care.

## 2020-06-12 NOTE — Patient Instructions (Signed)
Hip Bursitis Rehab Ask your health care provider which exercises are safe for you. Do exercises exactly as told by your health care provider and adjust them as directed. It is normal to feel mild stretching, pulling, tightness, or discomfort as you do these exercises. Stop right away if you feel sudden pain or your pain gets worse. Do not begin these exercises until told by your health care provider. Stretching exercise This exercise warms up your muscles and joints and improves the movement and flexibility of your hip. This exercise also helps to relieve pain and stiffness. Iliotibial band stretch An iliotibial band is a strong band of muscle tissue that runs from the outer side of your hip to the outer side of your thigh and knee. 1. Lie on your side with your left / right leg in the top position. 2. Bend your left / right knee and grab your ankle. Stretch out your bottom arm to help you balance. 3. Slowly bring your knee back so your thigh is behind your body. 4. Slowly lower your knee toward the floor until you feel a gentle stretch on the outside of your left / right thigh. If you do not feel a stretch and your knee will not fall farther, place the heel of your other foot on top of your knee and pull your knee down toward the floor with your foot. 5. Hold this position for __________ seconds. 6. Slowly return to the starting position. Repeat __________ times. Complete this exercise __________ times a day.   Strengthening exercises These exercises build strength and endurance in your hip and pelvis. Endurance is the ability to use your muscles for a long time, even after they get tired. Bridge This exercise strengthens the muscles that move your thigh backward (hip extensors). 1. Lie on your back on a firm surface with your knees bent and your feet flat on the floor. 2. Tighten your buttocks muscles and lift your buttocks off the floor until your trunk is level with your thighs. ? Do not arch  your back. ? You should feel the muscles working in your buttocks and the back of your thighs. If you do not feel these muscles, slide your feet 1-2 inches (2.5-5 cm) farther away from your buttocks. ? If this exercise is too easy, try doing it with your arms crossed over your chest. 3. Hold this position for __________ seconds. 4. Slowly lower your hips to the starting position. 5. Let your muscles relax completely after each repetition. Repeat __________ times. Complete this exercise __________ times a day.   Squats This exercise strengthens the muscles in front of your thigh and knee (quadriceps). 1. Stand in front of a table, with your feet and knees pointing straight ahead. You may rest your hands on the table for balance but not for support. 2. Slowly bend your knees and lower your hips like you are going to sit in a chair. ? Keep your weight over your heels, not over your toes. ? Keep your lower legs upright so they are parallel with the table legs. ? Do not let your hips go lower than your knees. ? Do not bend lower than told by your health care provider. ? If your hip pain increases, do not bend as low. 3. Hold the squat position for __________ seconds. 4. Slowly push with your legs to return to standing. Do not use your hands to pull yourself to standing. Repeat __________ times. Complete this exercise __________ times a day.   Hip hike 1. Stand sideways on a bottom step. Stand on your left / right leg with your other foot unsupported next to the step. You can hold on to the railing or wall for balance if needed. 2. Keep your knees straight and your torso square. Then lift your left / right hip up toward the ceiling. 3. Hold this position for __________ seconds. 4. Slowly let your left / right hip lower toward the floor, past the starting position. Your foot should get closer to the floor. Do not lean or bend your knees. Repeat __________ times. Complete this exercise __________ times  a day. Single leg stand 1. Without shoes, stand near a railing or in a doorway. You may hold on to the railing or door frame as needed for balance. 2. Squeeze your left / right buttock muscles, then lift up your other foot. ? Do not let your left / right hip push out to the side. ? It is helpful to stand in front of a mirror for this exercise so you can watch your hip. 3. Hold this position for __________ seconds. Repeat __________ times. Complete this exercise __________ times a day. This information is not intended to replace advice given to you by your health care provider. Make sure you discuss any questions you have with your health care provider. Document Revised: 06/26/2018 Document Reviewed: 06/26/2018 Elsevier Patient Education  2021 [employer]    Sacroiliac Joint Dysfunction  Sacroiliac joint dysfunction is a condition that causes inflammation on one or both sides of the sacroiliac (SI) joint. The SI joint is the joint between two bones of the pelvis called the sacrum and the ilium. The sacrum is the bone at the base of the spine. The ilium is the large bone that forms the hip. This condition causes deep aching or burning pain in the low back. In some cases, the pain may also spread into one or both buttocks, hips, or thighs. What are the causes? This condition may be caused by:  Pregnancy. During pregnancy, extra stress is put on the SI joints because the pelvis widens.  Injury, such as: ? Injuries from car crashes. ? Sports-related injuries. ? Work-related injuries.  Having one leg that is shorter than the other.  Conditions that affect the joints, such as: ? Rheumatoid arthritis. ? Gout. ? Psoriatic arthritis. ? Joint infection (septic arthritis). Sometimes, the cause of SI joint dysfunction is not known. What are the signs or symptoms? Symptoms of this condition include:  Aching or burning pain in the lower back. The pain may also spread to other areas, such  as: ? Buttocks. ? Groin. ? Thighs.  Muscle spasms in or around the painful areas.  Increased pain when standing, walking, running, stair climbing, bending, or lifting. How is this diagnosed? This condition is diagnosed with a physical exam and your medical history. During the exam, the health care provider may move one or both of your legs to different positions to check for pain. Various tests may be done to confirm the diagnosis, including:  Imaging tests to look for other causes of pain. These may include: ? MRI. ? CT scan. ? Bone scan.  Diagnostic injection. A numbing medicine is injected into the SI joint using a needle. If your pain is temporarily improved or stopped after the injection, this can indicate that SI joint dysfunction is the problem. How is this treated? Treatment depends on the cause and severity of your condition. Treatment options can be noninvasive and   may include:  Ice or heat applied to the lower back area after an injury. This may help reduce pain and muscle spasms.  Medicines to relieve pain or inflammation or to relax the muscles.  Wearing a back brace (sacroiliac brace) to help support the joint while your back is healing.  Physical therapy to increase muscle strength around the joint and flexibility at the joint. This may also involve learning proper body positions and ways of moving to relieve stress on the joint.  Direct manipulation of the SI joint.  Use of a device that provides electrical stimulation to help reduce pain at the joint. Other treatments may include:  Injections of steroid medicine into the joint to reduce pain and swelling.  Radiofrequency ablation. This treatment uses heat to burn away nerves that are carrying pain messages from the joint.  Surgery to put in screws and plates that limit or prevent joint motion. This is rare. Follow these instructions at home: Medicines  Take over-the-counter and prescription medicines only as  told by your health care provider.  Ask your health care provider if the medicine prescribed to you: ? Requires you to avoid driving or using machinery. ? Can cause constipation. You may need to take these actions to prevent or treat constipation:  Drink enough fluid to keep your urine pale yellow.  Take over-the-counter or prescription medicines.  Eat foods that are high in fiber, such as beans, whole grains, and fresh fruits and vegetables.  Limit foods that are high in fat and processed sugars, such as fried or sweet foods. If you have a brace:  Wear the brace as told by your health care provider. Remove it only as told by your health care provider.  Keep the brace clean.  If the brace is not waterproof: ? Do not let it get wet. ? Cover it with a watertight covering when you take a bath or a shower. Managing pain, stiffness, and swelling  Icing can help with pain and swelling. Heat may help with muscle tension or spasms. Ask your health care provider if you should use ice or heat.  If directed, put ice on the affected area: ? If you have a removable brace, remove it as told by your health care provider. ? Put ice in a plastic bag. ? Place a towel between your skin and the bag. ? Leave the ice on for 20 minutes, 2-3 times a day. ? Remove the ice if your skin turns bright red. This is very important. If you cannot feel pain, heat, or cold, you have a greater risk of damage to the area.  If directed, apply heat to the affected area as often as told by your health care provider. Use the heat source that your health care provider recommends, such as a moist heat pack or a heating pad. ? Place a towel between your skin and the heat source. ? Leave the heat on for 20-30 minutes. ? Remove the heat if your skin turns bright red. This is especially important if you are unable to feel pain, heat, or cold. You may have a greater risk of getting burned.      General instructions  Rest  as needed. Return to your normal activities as told by your health care provider. Ask your health care provider what activities are safe for you.  Do exercises as told by your health care provider or physical therapist.  Keep all follow-up visits. This is important. Contact a health care provider   if:  Your pain is not controlled with medicine.  You have a fever.  Your pain is getting worse. Get help right away if:  You have weakness, numbness, or tingling in your legs or feet.  You lose control of your bladder or bowels. Summary  Sacroiliac (SI) joint dysfunction is a condition that causes inflammation on one or both sides of the SI joint.  This condition causes deep aching or burning pain in the low back. In some cases, the pain may also spread into one or both buttocks, hips, or thighs.  Treatment depends on the cause and severity of your condition. It may include medicines to reduce pain and swelling or to relax muscles. This information is not intended to replace advice given to you by your health care provider. Make sure you discuss any questions you have with your health care provider. Document Revised: 07/12/2019 Document Reviewed: 07/12/2019 Elsevier Patient Education  2021 Else[employer] Inc.  

## 2020-06-13 LAB — COMPLETE METABOLIC PANEL WITH GFR
AG Ratio: 1.2 (calc) (ref 1.0–2.5)
ALT: 11 U/L (ref 6–29)
AST: 25 U/L (ref 10–35)
Albumin: 4.6 g/dL (ref 3.6–5.1)
Alkaline phosphatase (APISO): 57 U/L (ref 31–125)
BUN: 18 mg/dL (ref 7–25)
CO2: 30 mmol/L (ref 20–32)
Calcium: 10 mg/dL (ref 8.6–10.2)
Chloride: 104 mmol/L (ref 98–110)
Creat: 0.68 mg/dL (ref 0.50–1.10)
GFR, Est African American: 122 mL/min/{1.73_m2} (ref >=60)
GFR, Est Non African American: 105 mL/min/{1.73_m2} (ref >=60)
Globulin: 3.9 g/dL (calc) — ABNORMAL HIGH (ref 1.9–3.7)
Glucose, Bld: 90 mg/dL (ref 65–99)
Potassium: 3.9 mmol/L (ref 3.5–5.3)
Sodium: 140 mmol/L (ref 135–146)
Total Bilirubin: 0.5 mg/dL (ref 0.2–1.2)
Total Protein: 8.5 g/dL — ABNORMAL HIGH (ref 6.1–8.1)

## 2020-06-13 LAB — CBC WITH DIFFERENTIAL/PLATELET
Absolute Monocytes: 340 cells/uL (ref 200–950)
Basophils Absolute: 21 cells/uL (ref 0–200)
Basophils Relative: 0.5 %
Eosinophils Absolute: 152 cells/uL (ref 15–500)
Eosinophils Relative: 3.7 %
HCT: 39.4 % (ref 35.0–45.0)
Hemoglobin: 12.2 g/dL (ref 11.7–15.5)
Lymphs Abs: 1472 cells/uL (ref 850–3900)
MCH: 27.4 pg (ref 27.0–33.0)
MCHC: 31 g/dL — ABNORMAL LOW (ref 32.0–36.0)
MCV: 88.3 fL (ref 80.0–100.0)
MPV: 11 fL (ref 7.5–12.5)
Monocytes Relative: 8.3 %
Neutro Abs: 2116 cells/uL (ref 1500–7800)
Neutrophils Relative %: 51.6 %
Platelets: 305 10*3/uL (ref 140–400)
RBC: 4.46 10*6/uL (ref 3.80–5.10)
RDW: 13.4 % (ref 11.0–15.0)
Total Lymphocyte: 35.9 %
WBC: 4.1 10*3/uL (ref 3.8–10.8)

## 2020-06-13 NOTE — Progress Notes (Signed)
Total protein is borderline is elevated-8.5.  albumin is WNL.  Globulin is borderline elevated. Rest of CMP WNL.  CBC WNL. We will continue to monitor.

## 2020-06-16 NOTE — Progress Notes (Signed)
I called the patient to review x-ray results.  All questions were addressed.   Her discomfort has improved by 90-95% with the trochanteric bursa cortisone injection and by taking robaxin as needed.  She did not need to take the prednisone taper.   Discussed that if her discomfort persists or worsens we will proceed with a MRI at that time.   She will notify us if she does not continue to notice improvement in her symptoms.

## 2020-06-26 ENCOUNTER — Ambulatory Visit: Payer: 59 | Admitting: Physician Assistant

## 2020-07-04 ENCOUNTER — Other Ambulatory Visit (HOSPITAL_COMMUNITY)
Admission: RE | Admit: 2020-07-04 | Discharge: 2020-07-04 | Disposition: A | Discharge status: Home / Self Care | Payer: 59 | Source: Ambulatory Visit | Attending: Internal Medicine | Admitting: Internal Medicine

## 2020-07-04 DIAGNOSIS — Z20822 Contact with and (suspected) exposure to covid-19: Secondary | ICD-10-CM | POA: Diagnosis not present

## 2020-07-04 DIAGNOSIS — Z01812 Encounter for preprocedural laboratory examination: Secondary | ICD-10-CM | POA: Diagnosis present

## 2020-07-05 LAB — SARS CORONAVIRUS 2 (TAT 6-24 HRS): SARS Coronavirus 2: NEGATIVE

## 2020-07-08 ENCOUNTER — Encounter: Payer: 59 | Admitting: *Deleted

## 2020-07-08 ENCOUNTER — Other Ambulatory Visit: Payer: Self-pay

## 2020-07-08 ENCOUNTER — Encounter: Payer: Self-pay | Admitting: Internal Medicine

## 2020-07-08 ENCOUNTER — Ambulatory Visit (INDEPENDENT_AMBULATORY_CARE_PROVIDER_SITE_OTHER): Payer: 59 | Admitting: Internal Medicine

## 2020-07-08 VITALS — BP 122/80 | HR 97 | Temp 97.9°F | Ht 61.0 in | Wt 151.0 lb

## 2020-07-08 DIAGNOSIS — Z79899 Other long term (current) drug therapy: Secondary | ICD-10-CM

## 2020-07-08 DIAGNOSIS — E782 Mixed hyperlipidemia: Secondary | ICD-10-CM | POA: Insufficient documentation

## 2020-07-08 DIAGNOSIS — R87811 Vaginal high risk human papillomavirus (HPV) DNA test positive: Secondary | ICD-10-CM | POA: Insufficient documentation

## 2020-07-08 DIAGNOSIS — J849 Interstitial pulmonary disease, unspecified: Secondary | ICD-10-CM | POA: Diagnosis not present

## 2020-07-08 DIAGNOSIS — N926 Irregular menstruation, unspecified: Secondary | ICD-10-CM | POA: Insufficient documentation

## 2020-07-08 DIAGNOSIS — N882 Stricture and stenosis of cervix uteri: Secondary | ICD-10-CM | POA: Insufficient documentation

## 2020-07-08 DIAGNOSIS — E559 Vitamin D deficiency, unspecified: Secondary | ICD-10-CM | POA: Insufficient documentation

## 2020-07-08 DIAGNOSIS — M359 Systemic involvement of connective tissue, unspecified: Secondary | ICD-10-CM

## 2020-07-08 DIAGNOSIS — R5382 Chronic fatigue, unspecified: Secondary | ICD-10-CM | POA: Insufficient documentation

## 2020-07-08 DIAGNOSIS — Z5181 Encounter for therapeutic drug level monitoring: Secondary | ICD-10-CM | POA: Insufficient documentation

## 2020-07-08 DIAGNOSIS — Z006 Encounter for examination for normal comparison and control in clinical research program: Secondary | ICD-10-CM

## 2020-07-08 DIAGNOSIS — Z780 Asymptomatic menopausal state: Secondary | ICD-10-CM | POA: Insufficient documentation

## 2020-07-08 DIAGNOSIS — G9332 Myalgic encephalomyelitis/chronic fatigue syndrome: Secondary | ICD-10-CM | POA: Insufficient documentation

## 2020-07-08 DIAGNOSIS — N915 Oligomenorrhea, unspecified: Secondary | ICD-10-CM | POA: Insufficient documentation

## 2020-07-08 DIAGNOSIS — N83209 Unspecified ovarian cyst, unspecified side: Secondary | ICD-10-CM | POA: Insufficient documentation

## 2020-07-08 DIAGNOSIS — E039 Hypothyroidism, unspecified: Secondary | ICD-10-CM | POA: Insufficient documentation

## 2020-07-08 DIAGNOSIS — R87619 Unspecified abnormal cytological findings in specimens from cervix uteri: Secondary | ICD-10-CM | POA: Insufficient documentation

## 2020-07-08 DIAGNOSIS — J8489 Other specified interstitial pulmonary diseases: Secondary | ICD-10-CM

## 2020-07-08 DIAGNOSIS — R102 Pelvic and perineal pain: Secondary | ICD-10-CM | POA: Insufficient documentation

## 2020-07-08 LAB — PULMONARY FUNCTION TEST
DL/VA % pred: 134 %
DL/VA: 5.9 ml/min/mmHg/L
DLCO cor % pred: 62 %
DLCO cor: 12.9 ml/min/mmHg
DLCO unc % pred: 59 %
DLCO unc: 12.4 ml/min/mmHg
FEF 25-75 Pre: 2.88 L/sec
FEF2575-%Pred-Pre: 113 %
FEV1-%Pred-Pre: 69 %
FEV1-Pre: 1.61 L
FEV1FVC-%Pred-Pre: 112 %
FEV6-%Pred-Pre: 62 %
FEV6-Pre: 1.74 L
FEV6FVC-%Pred-Pre: 102 %
FVC-%Pred-Pre: 60 %
FVC-Pre: 1.74 L
Pre FEV1/FVC ratio: 92 %
Pre FEV6/FVC Ratio: 100 %

## 2020-07-08 NOTE — Research (Signed)
Title: Chronic Fibrosing Interstitial Lung Disease with Progressive Phenotype Prospective Outcomes (ILD-PRO) Registry   Protocol #: IPF-PRO-SUB, Clinical Trials # [id number], Sponsor: [employer]  Protocol Version Amendment 4 dated 12Sep2019 and confirmed current on 07/08/2020 Consent Version for today's visit date of 07/08/2020  [facility] Approved Version 16 Feb 2018 Revised 16 Feb 2018  Objectives:  Marland Kitchen Describe current approaches to diagnosis and treatment of chronic fibrosing ILDs with progressive phenotype  . Describe the natural history of chronic fibrosing ILDs with progressive phenotype  . Assess quality of life from self-administered participant reported questionnaires for each disease group  . Describe participant interactions with the healthcare system, describe treatment practices across multiple institutions for each disease group  . Collect biological samples linked to well characterized chronic fibrosing ILDs with progressive phenotype to identify disease biomarkers  . Collect data and biological samples that will support future research studies.                                            Key Inclusion Criteria: Willing and able to provide informed consent  Age ? 30 years  Diagnosis of a non-IPF ILD of any duration, including, but not limited to Idiopathic Non-Specific Interstitial Pneumonia (INSIP), Unclassifiable Idiopathic Interstitial Pneumonias (IIPs), Interstitial Pneumonia with Autoimmune Features (IPAF), Autoimmune ILDs such as Rheumatoid Arthritis (RA-ILD) and Systemic Sclerosis (SSC-ILD), Chronic Hypersensitivity Pneumonitis (HP), Sarcoidosis or Exposure-related ILDs such as asbestosis.  Chronic fibrosing ILD defined by reticular abnormality with traction bronchiectasis with or without honeycombing confirmed by chest HRCT scan and/or lung biopsy.  Progressive phenotype as defined by fulfilling at least one of the criteria below of fibrotic changes  (progression set point) within the last 24 months regardless of treatment considered appropriate in individual ILDs:  . decline in FVC % predicted (% pred) based on >10% relative decline  . decline in FVC % pred based on ? 5 - <10% relative decline in FVC combined with worsening of respiratory symptoms as assessed by the site investigator  . decline in FVC % pred based on ? 5 - <10% relative decline in FVC combined with increasing extent of fibrotic changes on chest imaging (HRCT scan) as assessed by the site investigator  . decline in DLCO % pred based on ? 10% relative decline  . worsening of respiratory symptoms as well as increasing extent of fibrotic changes on chest imaging (HRCT scan) as assessed by the site investigator independent of FVC change.    Key Exclusion Criteria: Malignancy, treated or untreated, other than skin or early stage prostate cancer, within the past 5 years  Currently listed for lung transplantation at the time of enrollment  Currently enrolled in a clinical trial at the time of enrollment in this registry       Clinical Research Coordinator / Research RN note : This visit for Subject [name] with DOB: 1975/12/22 on 07/08/2020 for the above protocol is Visit/Encounter #2 and is for purpose of research.   The consent for this encounter is under Protocol Amendment 4 (Version Date: 24 November 2017) and is currently IRB approved, Consent- [facility] Approved Version 16 Feb 2018 Revised 16 Feb 2018   Subject expressed continued interest and consent in continuing as a study subject. Subject confirmed that there was no change in contact information (e.g. address, telephone, email). Subject thanked for participation in research and contribution  to science.  During this visit on 07/08/2020, the subject completed the blood work and questionnaires per the above referenced protocol.  Please refer to the subject's paper source binder for further details.   Signed  by  Marc Morgans Clinical Research Coordinator  [facility]  [place], [place] 2:34 PM 07/08/2020

## 2020-07-08 NOTE — Progress Notes (Signed)
Spirometry and Dlco done today. 

## 2020-07-08 NOTE — Progress Notes (Signed)
17/2020 --45 year old woman with RA on methotrexate, followed now for chronic cough.  She had pulmonary function testing done that showed mixed obstruction and restriction without a significant bronchodilator response.  We will treat her for reflux and her cough did improve some, did not resolve.  I tried her last time on empiric albuterol to see if she would get any benefit.  She reports that her cough is largely unchanged. She is not having dyspnea, no breakthrough GERD.  Based on the restriction on pulmonary function testing and her history of RA we obtained a CT scan of the chest 10/26/2018 that I reviewed.  This shows some mild but diffuse base-predominent groundglass change peripherally without any frank honeycombing most notable on the left.   [name], [facility] > planning to do televisit Thurs, decrease MTX, possibly add a 2nd agent.   Video visit 02/20/2019 --45 year old woman with rheumatoid arthritis on methotrexate, mixed restriction and obstruction on pulmonary function testing and mild diffuse groundglass change peripherally on CT scan of the chest.  I saw her originally for chronic cough in the setting of these issues and also GERD. She has been followed by Dr Wallace Cullens with Rheumatology. She was treated with prednisone , tapering down now on  a day. Remains on MTX  once a week. Her cough has completely resolved. Breathing well.   ROV 04/26/2019 --Ms. Pauling is 37 and has a history of rheumatoid arthritis on methotrexate, mixed restriction and obstruction on PFTs.  Have seen her for chronic cough and an abnormal CT scan of the chest with peripheral groundglass changes.  She is on methotrexate 10 mg once a week, was treated with prednisone 60 mg tapering down to off as of 03/21/19. Now working w Dr Titus Dubin. She started Imuran on 04/11/19.    We repeated her CT scan of the chest on 03/14/2019 and I have reviewed, this shows persistent areas of base predominant groundglass  and septal thickening, mild peripheral bronchiolectasis especially in the left base.  No frank honeycombing.  Stable compared with 10/26/2018.  There are a few scattered tiny pulmonary nodules that are stable.  OV 05/24/2019 -first visit has interstitial lung disease center.  Transfer of care from Dr. Levy Pupa to the ILD center with Dr. Marchelle Gearing.  Subjective:  Patient ID: [name], female , DOB: 1975-06-20 , age 36 y.o. , MRN: [id number] , ADDRESS: [address] [place] [address]  Rheumatologist - Dr Marti Sleigh   05/24/2019 -   Chief Complaint  Patient presents with  . 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 Pt states she does become SOB with exertion and states she does still have a cough when she laughs. Pt denies any complaints of CP.     History is gained from talking to the patient and review of the chart.   HPI [name] 44 y.o. -carries a diagnosis of rheumatoid arthritis.  Her sister also has rheumatoid arthritis.  She was diagnosed in 2019.  Initially treated with methotrexate.  Used to see Ms. [name] physician assistant at [facility] rheumatology but has switched to Dr. Pollyann Savoy.  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 Tommi Rumps 2 days ago on 05/22/2019.  Note is that she is on 100 mg once daily of Imuran tolerating it well without any side effects.  No increase in joint pain after starting Imuran but she continues to experience stiffness and swelling in the morning for 20 to 30 minutes.  Raynard itself was not active.  In terms of her lung disease she has been seeing Dr. Delton Coombes since August 2020.  A CT scan in  August 2020 showed left lower lobe groundglass opacity without any honeycombing.  Acid reflux which is a ongoing problem for her was considered as an etiology.  She also had cough and shortness of breath.  She was treated with 4 months of prednisone which ended early January 2021.  After that the cough is minimal.  She has occasional acid reflux.  In terms of her shortness of breath is very minimal as documented below.  Currently overall from a pulmonary standpoint she is feeling well.  Last pulmonary function testing was in July 2020 and last CT scan of the chest was in end of December 2020 and was essentially unchanged over 4 months.   She had questions about COVID-19 vaccine.  [facility] [place] [facility] Comprehensive ILD Questionnaire 0= filled out 06/01/2019   Symptoms: Insidious onset of shortness of breath the last 1 year.  Since it started it is the same.  There is mild cough.    Past Medical History :  -Rheumatoid arthritis diagnosed in 2019.  Has thyroid disease hypothyroidism diagnosed October 2009.  Has history of pneumonia not otherwise specified January 1998.  History is otherwise negative   ROS: Positive for joint stiffness and pain since July 2019 leading to the diagnosis of rheumatoid arthritis subsequently.  She had Raynaud's description once in January 2020.  She is ongoing acid reflux on and off since 2015   FAMILY HISTORY of LUNG DISEASE: Father has emphysema.  Sister has autoimmune disease.   EXPOSURE HISTORY: Denies cigarettes or marijuana or vaping or cocaine or intravenous drug use   HOME and HOBBY DETAILS : Single-family home for the last 16 years.  Age of the home is 17 years.  Does use a humidifier but no mildew in the humidifier.  There is some mildew in the shower curtains.  No mold in the [facility] duct.  No other organic antigen exposure in the house   OCCUPATIONAL HISTORY (122 questions) : Negative for organic antigen exposure.  Negative for inorganic antigen  exposure   PULMONARY TOXICITY HISTORY (27 items): On methotrexate between October 2019 and February 2021.  To prednisone between September 2020 and January 2021.  On Imuran since February 2021    Results for ROSETTA, RUPNOW "DEE DEE" (MRN [id number]) as of 05/24/2019 12:22  Ref. Range 10/09/2018 12:56  FVC-%Pred-Pre Latest Units: % 65  FEV1-Pre Latest Units: L 1.57  Results for VERNEDA, HOLLOPETER "DEE DEE" (MRN [id number]) as of 05/24/2019 12:22  Ref. Range 10/09/2018 12:56  TLC Latest Units: L 3.07  TLC % pred Latest Units: % 63  Results for Beckel, Jonella D "DEE DEE" (MRN [id number]) as of 05/24/2019 12:22  Ref. Range 10/09/2018 12:56  DLCO unc Latest Units: ml/min/mmHg 15.54  DLCO unc % pred Latest Units: % 75    IMPRESSION: High-resolution CT chest December 2020 Lungs/Pleura: High-resolution images again demonstrate basal predominant areas of ground-glass attenuation, septal thickening, mild thickening of the peribronchovascular interstitium and some very mild peripheral bronchiolectasis (most evident in the left lower lobe).  No frank honeycombing. Findings are essentially stable compared to the recent prior study from 10/26/2018. Inspiratory and expiratory imaging is unremarkable. A few scattered tiny pulmonary nodules are noted in the lungs bilaterally, largest of which is a 5 mm nodule in the left upper lobe (axial image 79 of series 3). No other larger more suspicious appearing pulmonary nodules or masses are noted. No pleural effusions. No acute consolidative airspace disease.  1. The appearance of the lungs is compatible with interstitial lung disease, with a stable spectrum of findings technically classified as probable usual interstitial pneumonia (UIP) per current ATS guidelines. However, the possibility of nonspecific interstitial pneumonia is not excluded. Repeat high-resolution chest CT is recommended in 12 months to assess for temporal changes in the appearance  of the lung parenchyma. 2. Tiny pulmonary nodules measuring 5 mm or less in size, stable compared to the prior study. These are nonspecific, but statistically benign. Attention at time of repeat high-resolution chest CT is recommended to ensure continued stability.   Electronically Signed   By: Trudie Reed M.D.   On: 03/14/2019 12:56 ROS - per HPI   OV 07/31/2019  Subjective:  Patient ID: [name], female , DOB: [birth year] , age 73 y.o. , MRN: [id number] , ADDRESS: [address] [place] [address]   07/31/2019 -   Chief Complaint  Patient presents with  . 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 -returns for follow-up.  Since her last visit her symptoms are similar.  She reports better acid reflux control.  She had pulmonary function test that compared to a year ago shows decline documented below but she herself is feeling fine.  Her walking desaturation test is stable.  Her CT scan of the chest between August 2020 in December 2020 is stable.  She is currently on Imuran and is tolerating it well.  This is being monitored by Dr. Algis Downs.  There are no other new issues.  She reports she had echocardiogram earlier in the year and this was normal.    OV 01/29/2020   Subjective:  Patient ID: [name], female , DOB: 05/02/1975, age 16 y.o. years. , MRN: [id number],  ADDRESS: [address] [place] [address] PCP  Shirlean Mylar, MD Providers : Treatment Team:  Attending Provider: Kalman Shan, MD Patient Care Team: Shirlean Mylar, MD as PCP - General (Family Medicine)    Chief Complaint  Patient presents with  . 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 -presents for follow-up.  She continues on  Imuran.  She has had a flu shot.  She plans to have a Covid booster.  She continues to be asymptomatic/minimally symptomatic as documented below.  Walking desaturation test is stable.  She had pulmonary function test that compared to the spring of this year shows stable FVC but a slight drop in DLCO.  However there is no change in her walking desaturation test or her symptoms.  The quality of the PFT appears good.  Overall is a decline PFT since last summer but in the interim she has been on Imuran which seems to be helping her.  She has no side effects from Imuran.  She is a little concerned about the drop in DLCO but she  is willing to work with our plan.     PFT Results Latest Ref Rng & Units 01/29/2020 07/31/2019 10/09/2018  FVC-Pre L 1.70 1.71 -  FVC-Predicted Pre % 59 59 65  FVC-Post L - - 1.88  FVC-Predicted Post % - - 65  Pre FEV1/FVC % % 93 89 84  Post FEV1/FCV % % - - 92  FEV1-Pre L 1.58 1.52 1.57  FEV1-Predicted Pre % 67 64 67  FEV1-Post L - - 1.73  DLCO uncorrected ml/min/mmHg 12.72 13.00 15.54  DLCO UNC% % 61 62 75  DLCO corrected ml/min/mmHg 13.43 13.00 -  DLCO COR %Predicted % 64 62 -  DLVA Predicted % 125 113 128  TLC L - - 3.07  TLC % Predicted % - - 63  RV % Predicted % - - 74      ROS    OV 07/08/2020  Subjective:  Patient ID: [name], female , DOB: 10-Jul-1975 , age 89 y.o. , MRN: [id number] , ADDRESS: [address] [place] [address] PCP Shirlean Mylar, MD Patient Care Team: Shirlean Mylar, MD as PCP - General (Family Medicine)  This Provider for this visit: Treatment Team:  Attending Provider: Kalman Shan, MD    07/08/2020 -   Chief Complaint  Patient presents with  . 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 -returns for follow-up.  Last visit I was concerned about more progression.  Today's symptom scores are stable, walking desaturation test is stable pulmonary function test is stable.  Symptom score is very mild.  High-resolution CT chest shows stable ILD.  No progression.  She is done her ILD-per registry visit today.  She is on Imuran and overall she is stable.    SYMPTOM SCALE - ILD 05/24/2019  07/31/2019  01/29/2020 Last Weight  Most recent update: 01/29/2020  2:12 PM    Weight  67.6 kg (149 lb)            07/08/2020   O2 use ra ra ra ra  Shortness of Breath 0 -> 5 scale with 5 being worst (score 6 If unable to do)     At rest 0 0 0 0  Simple tasks - showers, clothes change, eating, shaving 1 0 0 0  Household (dishes, doing bed, laundry) 1 0 1 0  Shopping 0 0 0 0  Walking level at own pace 1 0 0 0  Walking up Stairs Total (30-36) Dyspnea Score How bad is your cough? 2 1 0 0  How bad is your fatigue 1 0 0 0  How bad is nausea 00 0 0 0  How bad is vomiting?  0 0  0  How bad is diarrhea? 0 0 0 0  How bad is anxiety? 0 0 0 0  How bad is depression 0 0 0 0      Simple office walk 185 feet x  3 laps goal with forehead probe 05/24/2019  07/31/2019  ra 07/08/2020   O2 used ra ra    Number laps completed 3 3    Comments about pace avg Mod pace    Resting Pulse Ox/HR 100% and 89/min 100% and 71  100% and 89/min 98% nand 97  Final Pulse Ox/HR 99% and 102/min 99% and 85 98% and 107/min 100% and 99/min  Desaturated </= 88% no     Desaturated <= 3% points no     Got Tachycardic >/= 90/min yes     Symptoms at end of test Very mild dyspnea  No dysonea   Miscellaneous comments x        HRCT feb 2022  CLINICAL DATA:  Interstitial lung disease, history of rheumatoid arthritis  EXAM: CT CHEST WITHOUT CONTRAST  TECHNIQUE: Multidetector CT imaging of the chest was performed following the standard protocol  without intravenous contrast. High resolution imaging of the lungs, as well as inspiratory and expiratory imaging, was performed.  COMPARISON:  03/14/2019  FINDINGS: Cardiovascular: No significant vascular findings. Normal heart size. No pericardial effusion.  Mediastinum/Nodes: Unchanged prominent axillary lymph nodes. No enlarged mediastinal or hilar lymph nodes. Thyroid gland, trachea, and esophagus demonstrate no significant findings.  Lungs/Pleura: Unchanged mild pulmonary fibrosis in a pattern featuring apical to basal gradient, predominantly characterized by irregular peripheral interstitial opacity and ground-glass, with minimal subpleural bronchiolectasis particularly in the left lung base. No significant air trapping on expiratory phase imaging. Stable, definitively benign 5 mm nodule of the lingula (series 5, image 150). No pleural effusion or pneumothorax.  Upper Abdomen: No acute abnormality.  Musculoskeletal: No chest wall mass or suspicious bone lesions identified.  IMPRESSION: 1. Unchanged mild pulmonary fibrosis in a pattern featuring apical to basal gradient, predominantly characterized by irregular peripheral interstitial opacity and ground-glass, with minimal subpleural bronchiolectasis particularly in the left lung base. No significant air trapping on expiratory phase imaging. 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 Findings are categorized as probable UIP per consensus guidelines: Diagnosis of Idiopathic Pulmonary Fibrosis: An Official ATS/ERS/JRS/ALAT Clinical Practice Guideline. Am Rosezetta Schlatter Crit Care Med Vol 198, Iss 5, [phone], Nov 13 2016. 2. Stable, definitively benign 5 mm nodule of the lingula.   Electronically Signed   By: Lauralyn Primes M.D.   On: 05/01/2020 16:59    PFT  PFT Results Latest Ref Rng & Units 07/08/2020 01/29/2020 07/31/2019 10/09/2018  FVC-Pre L 1.74 1.70  1.71 -  FVC-Predicted Pre % 60 59 59 65  FVC-Post L - - - 1.88  FVC-Predicted Post % - - - 65  Pre FEV1/FVC % % 92 93 89 84  Post FEV1/FCV % % - - - 92  FEV1-Pre L 1.61 1.58 1.52 1.57  FEV1-Predicted Pre % 69 67 64 67  FEV1-Post L - - - 1.73  DLCO uncorrected ml/min/mmHg 12.40 12.72 13.00 15.54  DLCO UNC% % 59 61 62 75  DLCO corrected ml/min/mmHg 12.90 13.43 13.00 -  DLCO COR %Predicted % 62 64 62 -  DLVA Predicted % 134 125 113 128  TLC L - - - 3.07  TLC % Predicted % - - - 63  RV % Predicted % - - - 74       has a past medical history of Abnormal Pap smear, Dysplasia of cervix, low grade (CIN 1), H/O: pneumonia, Hypothyroidism, and Rheumatoid arthritis (HCC).   reports that she has never smoked. She has never used smokeless tobacco.  Past Surgical History:  Procedure Laterality Date  . CESAREAN SECTION    . LAPAROSCOPIC TUBAL LIGATION  02/01/2011   Procedure: LAPAROSCOPIC TUBAL LIGATION;  Surgeon: Purcell Nails, MD;  Location: [facility];  Service: Gynecology;  Laterality: Bilateral;  . LEEP  Allergies  Allergen Reactions  . Other     Cats Fruit     Immunization History  Administered Date(s) Administered  . Influenza Split 12/19/2012, 03/05/2014, 11/24/2015, 11/28/2017, 12/28/2019  . Influenza,inj,Quad PF,6+ Mos 12/29/2018  . Influenza,inj,quad, With Preservative 01/10/2015  . PFIZER(Purple Top)SARS-COV-2 Vaccination 05/25/2019, 06/15/2019, 02/01/2020  . Td 08/14/2003, 07/02/2015  . Tdap 03/05/2014    Family History  Problem Relation Age of Onset  . Hypertension Mother   . Diabetes Mother   . Stroke Mother   . Hypertension Father   . Rheum arthritis Sister   . Hypertension Brother   . Healthy Son   . Healthy Daughter   . Rheum arthritis Maternal Aunt   . Rheum arthritis Maternal Aunt      Current Outpatient Medications:  .  aspirin-acetaminophen-caffeine (EXCEDRIN MIGRAINE) 250-250-65 MG tablet, Take by mouth as needed for headache., Disp: ,  Rfl:  .  azaTHIOprine (IMURAN) 50 MG tablet, TAKE 1 AND 1/2 TABLETS BY MOUTH DAILY (Patient taking differently: Total ), Disp: 135 tablet, Rfl: 0 .  Calcium-Vitamin D-Vitamin K (VIACTIV CALCIUM PLUS D) 650-12.5-40 MG-MCG-MCG CHEW, daily., Disp: , Rfl:  .  levothyroxine (SYNTHROID, LEVOTHROID) 50 MCG tablet, Take 50 mcg by mouth daily., Disp: , Rfl:  .  loratadine (CLARITIN) 10 MG tablet, TAKE 1 TABLET BY MOUTH EVERY DAY, Disp: 90 tablet, Rfl: 1 .  Multiple Vitamin (MULTIVITAMIN WITH MINERALS) TABS tablet, Take 1 tablet by mouth daily., Disp: , Rfl:  .  omeprazole (PRILOSEC) 20 MG capsule, Take 20 mg by mouth daily., Disp: , Rfl:  .  Turmeric 1053 MG TABS, daily., Disp: , Rfl:       Objective:   Vitals:   07/08/20 1333  BP: 122/80  Pulse: 97  Temp: 97.9 F (36.6 C)  SpO2: 99%  Weight: 151 lb (68.5 kg)  Height:  (1.549 m)    Estimated body mass index is 28.53 kg/m as calculated from the following:   Height as of this encounter:  (1.549 m).   Weight as of this encounter: 151 lb (68.5 kg).  @  [employer]   07/08/20 1333  Weight: 151 lb (68.5 kg)     Physical Exam   General: No distress. Look swell Neuro: Alert and Oriented x 3. GCS 15. Speech normal Psych: Pleasant Resp:  Barrel Chest - no.  Wheeze - no, Crackles - no, No overt respiratory distress CVS: Normal heart sounds. Murmurs - no Ext: Stigmata of Connective Tissue Disease - no HEENT: Normal upper airway. PEERL +. No post nasal drip        Assessment:       ICD-10-CM   1. ILD (interstitial lung disease) (HCC)  J84.9   2. High risk medication use  Z79.899        Plan:     Patient Instructions     ICD-10-CM   1. ILD (interstitial lung disease) (HCC)  J84.9   2. High risk medication use  Z79.899     Interstitial lung disease likely due to rheumatoid arthritis with or without acid reflux -progressive phenotype compared to July 2020 with subsequent decline in lung function  in May 2021 but ton this visit 07/08/2020  Your are stable on symptoms, walk test, breathing test and CT chest   Plan Continue control of acid reflux  -Sleep with head end of the bed elevated [best method is to elevate the head and using a breaker pillow at a 30 degree angle or even 20  degree angle]  - can do OTC prilosec  daily in empty stomach  -Continue Imuran via rheumatology -  should help with joints and also has antifibrotic effect  - Complete lab work for [employer] registry - meet with Marc Morgans of [employer]  Follow-up -Return in 6 months for a 15-minute encounter   = symptom score and simple walk test at followup  -  If there is evidence of continued progression then we can discuss antifibrotic therapy      SIGNATURE    Dr. Kalman Shan, M.D., F.C.C.P,  Pulmonary and Critical Care Medicine Staff Physician, [facility] Center Director - Interstitial Lung Disease  Program  Pulmonary Fibrosis [facility] at [facility] [place], [place], [address]  Pager: [phone], If no answer or between  15:00h - 7:00h: call [phone] Telephone: [phone]  1:58 PM 07/08/2020

## 2020-07-08 NOTE — Patient Instructions (Addendum)
ICD-10-CM   1. ILD (interstitial lung disease) (HCC)  J84.9   2. High risk medication use  Z79.899     Interstitial lung disease likely due to rheumatoid arthritis with or without acid reflux -progressive phenotype compared to July 2020 with subsequent decline in lung function in May 2021 but ton this visit 07/08/2020  Your are stable on symptoms, walk test, breathing test and CT chest   Plan Continue control of acid reflux  -Sleep with head end of the bed elevated [best method is to elevate the head and using a breaker pillow at a 30 degree angle or even 20 degree angle]  - can do OTC prilosec 20mg  daily in empty stomach  -Continue Imuran via rheumatology -  should help with joints and also has antifibrotic effect  - Complete lab work for registry - meet with [employer] of [facility]  Follow-up -Return in 6 months for a 15-minute encounter   = symptom score and simple walk test at followup  -  If there is evidence of continued progression then we can discuss antifibrotic therapy

## 2020-07-31 ENCOUNTER — Other Ambulatory Visit: Payer: Self-pay | Admitting: Rheumatology

## 2020-07-31 NOTE — Telephone Encounter (Signed)
Next Visit: 11/06/2020  Last Visit: 06/12/2020  Last Fill: 05/05/2020  DX:  Rheumatoid arthritis involving multiple sites with positive rheumatoid factor   Current Dose per office note 06/12/2020, Imuran 75 mg po qd   Labs: 06/12/2020, Total protein is borderline is elevated-8.5. albumin is WNL. Globulin is borderline elevated. Rest of CMP WNL. CBC WNL. We will continue to monitor.   Okay to refill Imuran?

## 2020-10-24 NOTE — Progress Notes (Signed)
Office Visit Note  Patient: [name]             Date of Birth: 1975-04-01           MRN: [id number]             PCP: Shirlean Mylar, MD Referring: Shirlean Mylar, MD Visit Date: 11/06/2020 Occupation: @GUAROCC @  Subjective:  Medication monitoring   History of Present Illness: is a 45 y.o. female with history of seropositive rheumatoid arthritis and ILD.  Patient is currently taking Imuran 75 mg daily.  She continues to tolerate Imuran and denies missing any doses recently.  She denies any recent rheumatoid arthritis flares.  She is not experiencing any joint pain or joint swelling at this time.  She states last week she had some discomfort in her right shoulder which she attributes to sleeping on.  She states that her right shoulder pain has resolved.  She states she is no longer having discomfort due to trochanter bursitis of the left hip.  She states the injection at her last office visit provided significant relief and she has not had any recurrence of symptoms.  She states that about 1-1/2 weeks ago she started to have chest wall pain.  She was evaluated by her PCP several days later was diagnosed with costochondritis.  She took several doses of ibuprofen and her symptoms have completely resolved.  She states that she was playing basketball with her son prior to symptom onset which she feels likely contributed to the discomfort she is experiencing.  She denies any shortness of breath or coughing at this time.  She denies any recent infections. She had a follow-up appointment with Dr. 49 on 07/08/2020 for management of ILD.  At that time he recommended continuing Imuran since her ILD has been stable.  She has not had any recent rashes, hair loss, symptoms of Raynaud's, oral or nasal ulcerations, swollen lymph nodes, fevers, or eye dryness.  She has occasional mouth dryness at night.   Activities of Daily Living:  Patient reports morning stiffness for 0 minutes.    Patient Denies nocturnal pain.  Difficulty dressing/grooming: Denies Difficulty climbing stairs: Denies Difficulty getting out of chair: Denies Difficulty using hands for taps, buttons, cutlery, and/or writing: Denies  Review of Systems  Constitutional:  Negative for fatigue.  HENT:  Negative for mouth sores, mouth dryness and nose dryness.   Eyes:  Negative for pain, itching and dryness.  Respiratory:  Negative for shortness of breath and difficulty breathing.   Cardiovascular:  Negative for chest pain and palpitations.  Gastrointestinal:  Negative for blood in stool, constipation and diarrhea.  Endocrine: Negative for increased urination.  Genitourinary:  Negative for difficulty urinating.  Musculoskeletal:  Negative for joint pain, joint pain, joint swelling, myalgias, morning stiffness, muscle tenderness and myalgias.  Skin:  Negative for color change, rash and redness.  Allergic/Immunologic: Negative for susceptible to infections.  Neurological:  Negative for dizziness, numbness, headaches, memory loss and weakness.  Hematological:  Negative for bruising/bleeding tendency.  Psychiatric/Behavioral:  Negative for confusion.    PMFS History:  Patient Active Problem List   Diagnosis Date Noted   Abnormal cervical Papanicolaou smear 07/08/2020   Chronic fatigue syndrome 07/08/2020   Cyst of ovary 07/08/2020   Encounter for therapeutic drug level monitoring 07/08/2020   Hypothyroidism 07/08/2020   Irregular periods 07/08/2020   Menopause 07/08/2020   Mixed hyperlipidemia 07/08/2020   Oligomenorrhea 07/08/2020   Pain in pelvis 07/08/2020  Stenosis of cervix 07/08/2020   Vaginal high risk human papillomavirus (HPV) DNA test positive 07/08/2020   Vitamin D deficiency 07/08/2020   ILD (interstitial lung disease) ([place]) 02/20/2019   Restrictive lung disease 10/09/2018   Chronic cough 09/07/2018   RA (rheumatoid arthritis) ([place]) 09/07/2018   H/O: pneumonia    Dysplasia of  cervix, low grade (CIN 1 and 2)     Past Medical History:  Diagnosis Date   Abnormal Pap smear    Dysplasia of cervix, low grade (CIN 1)    H/O: pneumonia    Hypothyroidism    Rheumatoid arthritis ([place])     Family History  Problem Relation Age of Onset   Hypertension Mother    Diabetes Mother    Stroke Mother    Hypertension Father    Rheum arthritis Sister    Hypertension Brother    Healthy Son    Healthy Daughter    Rheum arthritis Maternal Aunt    Rheum arthritis Maternal Aunt    Past Surgical History:  Procedure Laterality Date   CESAREAN SECTION     LAPAROSCOPIC TUBAL LIGATION  02/01/2011   Procedure: LAPAROSCOPIC TUBAL LIGATION;  Surgeon: Delice Lesch, MD;  Location: [facility];  Service: Gynecology;  Laterality: Bilateral;   LEEP     Social History   Social History Narrative   Not on file   Immunization History  Administered Date(s) Administered   Influenza Split 12/19/2012, 03/05/2014, 11/24/2015, 11/28/2017, 12/28/2019   Influenza,inj,Quad PF,6+ Mos 12/29/2018   Influenza,inj,quad, With Preservative 01/10/2015   PFIZER(Purple Top)SARS-COV-2 Vaccination 05/25/2019, 06/15/2019, 02/01/2020   Td 08/14/2003, 07/02/2015   Tdap 03/05/2014     Objective: Vital Signs: BP (!) 138/91 (BP Location: Left Arm, Patient Position: Sitting, Cuff Size: Normal)   Pulse 69   Ht $R'5\' 1"'Wq$  (1.549 m)   Wt 150 lb 12.8 oz (68.4 kg)   LMP 08/26/2013   BMI 28.49 kg/m    Physical Exam Vitals and nursing note reviewed.  Constitutional:      Appearance: She is well-developed.  HENT:     Head: Normocephalic and atraumatic.  Eyes:     Conjunctiva/sclera: Conjunctivae normal.  Cardiovascular:     Rate and Rhythm: Normal rate and regular rhythm.     Pulses: Normal pulses.     Heart sounds: Normal heart sounds.  Pulmonary:     Effort: Pulmonary effort is normal.     Breath sounds: Normal breath sounds.  Abdominal:     General: Bowel sounds are normal.     Palpations: Abdomen  is soft.  Musculoskeletal:     Cervical back: Normal range of motion.  Skin:    General: Skin is warm and dry.     Capillary Refill: Capillary refill takes less than 2 seconds.  Neurological:     Mental Status: She is alert and oriented to person, place, and time.  Psychiatric:        Behavior: Behavior normal.     Musculoskeletal Exam: C-spine, thoracic spine, lumbar spine have good range of motion with no discomfort.  Shoulder joints, elbow joints, wrist joints, MCPs, PIPs, DIPs have good range of motion with no synovitis.  She was able to make a complete fist bilaterally.  Hip joints have good range of motion with no discomfort.  No tenderness over trochanteric bursa bilaterally.  Knee joints have good range of motion with no warmth or effusion.  Ankle joints have good range of motion with no tenderness or joint swelling.  No  tenderness over MTP joints.  CDAI Exam: CDAI Score: 0.2  Patient Global: 1 mm; Provider Global: 1 mm Swollen: 0 ; Tender: 0  Joint Exam 11/06/2020   No joint exam has been documented for this visit   There is currently no information documented on the homunculus. Go to the Rheumatology activity and complete the homunculus joint exam.  Investigation: No additional findings.  Imaging: No results found.  Recent Labs: Lab Results  Component Value Date   WBC 4.1 06/12/2020   HGB 12.2 06/12/2020   PLT 305 06/12/2020   NA 140 06/12/2020   K 3.9 06/12/2020   CL 104 06/12/2020   CO2 30 06/12/2020   GLUCOSE 90 06/12/2020   BUN 18 06/12/2020   CREATININE 0.68 06/12/2020   BILITOT 0.5 06/12/2020   ALKPHOS 92 07/07/2007   AST 25 06/12/2020   ALT 11 06/12/2020   PROT 8.5 (H) 06/12/2020   ALBUMIN 1.8 (L) 07/07/2007   CALCIUM 10.0 06/12/2020   GFRAA 122 06/12/2020   QFTBGOLDPLUS NEGATIVE 03/07/2019    Speciality Comments: Treated with methotrexate (December 2019-February 2021)which was stopped due to shortness of breath. Imuran and started in February  2021  Procedures:  No procedures performed Allergies: Other     Assessment / Plan:     Visit Diagnoses: Rheumatoid arthritis involving multiple sites with positive rheumatoid factor (HCC) - Positive RF, +14 3 3  eta and elevated ESR.  Diagnosed in 2019. MTX(12/19-02/21)-dcd after shortness of breath: She has no tenderness or synovitis on examination today.  She has not had any recent rheumatoid arthritis flares.  She is clinically doing well taking Imuran 75 mg by mouth daily.  She continues to tolerate Imuran without any side effects and has not missed any doses recently.  She has not been experiencing any morning stiffness or nocturnal pain.  She has no difficulty with ADLs.  She has been walking on a daily basis for exercise.  She will remain on Imuran 75 mg by mouth daily.  She was advised to notify us if she develops increased joint pain or joint swelling.  She will follow-up in the office in 5 months.  High risk medication use - Imuran 75 mg by mouth daily. (started in February 2021), previously on MTX 4 tablets by mouth once weekly and folic acid 1 mg po qd (started in 02/2018-d/c January 2021) CBC and CMP updated on 06/12/2020.  She is overdue to update lab work so orders for CBC and CMP were released.  Her next lab work will be due in November and every 3 months to monitor for drug toxicity.  Standing orders for CBC and CMP were placed today.- Plan: COMPLETE METABOLIC PANEL WITH GFR, CBC with Differential/Platelet, CBC with Differential/Platelet, COMPLETE METABOLIC PANEL WITH GFR She has not had any recent infections.  We discussed the importance of holding Imuran if she develops signs or symptoms of an infection and to resume once the infection is completely cleared.  She voiced understanding.  Positive ANA (antinuclear antibody) - ANA 1: 1280 speckled, positive Ro antibody: She has no clinical features of systemic lupus at this time.  She has occasional symptoms of dry mouth at night but has  not had any eye dryness.  She has not had any recent rashes, hair loss, symptoms of Raynaud's, oral or nasal ulcerations, swollen lymph nodes, fevers, or fatigue.  Raynaud's syndrome without gangrene: Not currently active.  She had good capillary refill on examination today.  No digital ulcerations or signs of sclerodactyly  noted.  ILD (interstitial lung disease) [facility] Patient was evaluated by Dr. Chase Caller on 07/08/2020 for management of ILD.  She had an updated high-resolution chest CT on 05/01/2020: Unchanged mild pulmonary fibrosis.  Pulmonary function testing was performed on 07/08/2020.  She is not experiencing any shortness of breath, pleuritic chest pain, or cough at this time.  She will remain on Imuran as prescribed.  She will continue to follow up with Dr. Chase Caller every 6 months.   Chronic left SI joint pain: Resolved.  Trochanteric bursitis, left hip: Resolved.  No recurrence of symptoms after having a cortisone injection on 06/12/2020.  She had no tenderness palpation on examination today.  Elevated LFTs: She tries to avoid taking Tylenol and NSAIDs.  CMP with GFR ordered today.  Costochondritis: Recent episode of costochondritis diagnosed by PCP last week.  She took several doses of ibuprofen and her symptoms resolved.  She is not experiencing any pleuritic chest pain, shortness of breath, or chest wall tenderness at this time.  Other medical conditions are listed as follows:  History of hyperlipidemia  Gastroesophageal reflux disease without esophagitis  History of hypothyroidism  H/O: pneumonia  Vitamin D deficiency  Dysplasia of cervix, low grade (CIN 1 and 2)  Thyroid goiter  Orders: Orders Placed This Encounter  Procedures   COMPLETE METABOLIC PANEL WITH GFR   CBC with Differential/Platelet   CBC with Differential/Platelet   COMPLETE METABOLIC PANEL WITH GFR   No orders of the defined types were placed in this encounter.    Follow-Up Instructions: Return in  about 5 months (around 04/08/2021) for Rheumatoid arthritis, ILD.   Ofilia Neas, PA-C  Note - This record has been created using Dragon software.  Chart creation errors have been sought, but may not always  have been located. Such creation errors do not reflect on  the standard of medical care.

## 2020-10-28 ENCOUNTER — Encounter: Payer: Self-pay | Admitting: Rheumatology

## 2020-10-28 ENCOUNTER — Other Ambulatory Visit: Payer: Self-pay | Admitting: Rheumatology

## 2020-10-28 ENCOUNTER — Telehealth: Payer: Self-pay | Admitting: *Deleted

## 2020-10-28 NOTE — Telephone Encounter (Signed)
She should see her PCP

## 2020-10-28 NOTE — Telephone Encounter (Signed)
Next Visit: 11/06/2020   Last Visit: 06/12/2020   Last Fill: 05/05/2020   DX:  Rheumatoid arthritis involving multiple sites with positive rheumatoid factor    Current Dose per office note 06/12/2020, Imuran 75 mg po qd    Labs: 06/12/2020, Total protein is borderline is elevated-8.5.  albumin is WNL.  Globulin is borderline elevated. Rest of CMP WNL.  CBC WNL. We will continue to monitor.   Noted pateint's appointment for 11/06/2020 to update labs.    Okay to refill Imuran?

## 2020-10-28 NOTE — Telephone Encounter (Signed)
Error

## 2020-11-06 ENCOUNTER — Other Ambulatory Visit: Payer: Self-pay

## 2020-11-06 ENCOUNTER — Encounter: Payer: Self-pay | Admitting: Physician Assistant

## 2020-11-06 ENCOUNTER — Ambulatory Visit (INDEPENDENT_AMBULATORY_CARE_PROVIDER_SITE_OTHER): Payer: 59 | Admitting: Physician Assistant

## 2020-11-06 VITALS — BP 138/91 | HR 69 | Ht 61.0 in | Wt 150.8 lb

## 2020-11-06 DIAGNOSIS — I7300 Raynaud's syndrome without gangrene: Secondary | ICD-10-CM

## 2020-11-06 DIAGNOSIS — G8929 Other chronic pain: Secondary | ICD-10-CM

## 2020-11-06 DIAGNOSIS — M533 Sacrococcygeal disorders, not elsewhere classified: Secondary | ICD-10-CM

## 2020-11-06 DIAGNOSIS — J849 Interstitial pulmonary disease, unspecified: Secondary | ICD-10-CM

## 2020-11-06 DIAGNOSIS — K219 Gastro-esophageal reflux disease without esophagitis: Secondary | ICD-10-CM

## 2020-11-06 DIAGNOSIS — R768 Other specified abnormal immunological findings in serum: Secondary | ICD-10-CM | POA: Diagnosis not present

## 2020-11-06 DIAGNOSIS — M7062 Trochanteric bursitis, left hip: Secondary | ICD-10-CM

## 2020-11-06 DIAGNOSIS — M0579 Rheumatoid arthritis with rheumatoid factor of multiple sites without organ or systems involvement: Secondary | ICD-10-CM | POA: Diagnosis not present

## 2020-11-06 DIAGNOSIS — E049 Nontoxic goiter, unspecified: Secondary | ICD-10-CM

## 2020-11-06 DIAGNOSIS — Z8639 Personal history of other endocrine, nutritional and metabolic disease: Secondary | ICD-10-CM

## 2020-11-06 DIAGNOSIS — R7989 Other specified abnormal findings of blood chemistry: Secondary | ICD-10-CM

## 2020-11-06 DIAGNOSIS — M940 Chondrocostal junction syndrome [Tietze]: Secondary | ICD-10-CM

## 2020-11-06 DIAGNOSIS — N870 Mild cervical dysplasia: Secondary | ICD-10-CM

## 2020-11-06 DIAGNOSIS — M545 Low back pain, unspecified: Secondary | ICD-10-CM

## 2020-11-06 DIAGNOSIS — E559 Vitamin D deficiency, unspecified: Secondary | ICD-10-CM

## 2020-11-06 DIAGNOSIS — Z79899 Other long term (current) drug therapy: Secondary | ICD-10-CM

## 2020-11-06 DIAGNOSIS — Z8701 Personal history of pneumonia (recurrent): Secondary | ICD-10-CM

## 2020-11-06 LAB — COMPLETE METABOLIC PANEL WITH GFR
AG Ratio: 1.2 (calc) (ref 1.0–2.5)
ALT: 8 U/L (ref 6–29)
AST: 23 U/L (ref 10–35)
Albumin: 4.4 g/dL (ref 3.6–5.1)
Alkaline phosphatase (APISO): 53 U/L (ref 31–125)
BUN: 17 mg/dL (ref 7–25)
CO2: 28 mmol/L (ref 20–32)
Calcium: 10.1 mg/dL (ref 8.6–10.2)
Chloride: 103 mmol/L (ref 98–110)
Creat: 0.7 mg/dL (ref 0.50–0.99)
Globulin: 3.7 g/dL (calc) (ref 1.9–3.7)
Glucose, Bld: 86 mg/dL (ref 65–99)
Potassium: 3.9 mmol/L (ref 3.5–5.3)
Sodium: 140 mmol/L (ref 135–146)
Total Bilirubin: 0.5 mg/dL (ref 0.2–1.2)
Total Protein: 8.1 g/dL (ref 6.1–8.1)
eGFR: 108 mL/min/{1.73_m2} (ref >=60)

## 2020-11-06 LAB — CBC WITH DIFFERENTIAL/PLATELET
Absolute Monocytes: 439 cells/uL (ref 200–950)
Basophils Absolute: 22 cells/uL (ref 0–200)
Basophils Relative: 0.5 %
Eosinophils Absolute: 108 cells/uL (ref 15–500)
Eosinophils Relative: 2.5 %
HCT: 36.5 % (ref 35.0–45.0)
Hemoglobin: 11.6 g/dL — ABNORMAL LOW (ref 11.7–15.5)
Lymphs Abs: 1342 cells/uL (ref 850–3900)
MCH: 27.8 pg (ref 27.0–33.0)
MCHC: 31.8 g/dL — ABNORMAL LOW (ref 32.0–36.0)
MCV: 87.5 fL (ref 80.0–100.0)
MPV: 10.8 fL (ref 7.5–12.5)
Monocytes Relative: 10.2 %
Neutro Abs: 2391 cells/uL (ref 1500–7800)
Neutrophils Relative %: 55.6 %
Platelets: 263 10*3/uL (ref 140–400)
RBC: 4.17 10*6/uL (ref 3.80–5.10)
RDW: 13.5 % (ref 11.0–15.0)
Total Lymphocyte: 31.2 %
WBC: 4.3 10*3/uL (ref 3.8–10.8)

## 2020-11-06 NOTE — Patient Instructions (Signed)
Standing Labs We placed an order today for your standing lab work.   Please have your standing labs drawn in November and every 3 months  If possible, please have your labs drawn 2 weeks prior to your appointment so that the provider can discuss your results at your appointment.  Please note that you may see your imaging and lab results in MyChart before we have reviewed them. We may be awaiting multiple results to interpret others before contacting you. Please allow our office up to 72 hours to thoroughly review all of the results before contacting the office for clarification of your results.  We have open lab daily: Monday through Thursday from 1:30-4:30 PM and Friday from 1:30-4:00 PM at the office of Dr. Shaili Deveshwar, [facility].   Please be advised, all patients with office appointments requiring lab work will take precedent over walk-in lab work.  If possible, please come for your lab work on Monday and Friday afternoons, as you may experience shorter wait times. The office is located at [address], [address], [place], [place] [address] No appointment is necessary.   Labs are drawn by [facility]. Please bring your co-pay at the time of your lab draw.  You may receive a bill from [facility] for your lab work.  If you wish to have your labs drawn at another location, please call the office 24 hours in advance to send orders.  If you have any questions regarding directions or hours of operation,  please call [phone].   As a reminder, please drink plenty of water prior to coming for your lab work. Thanks!  

## 2020-11-07 NOTE — Progress Notes (Signed)
Mild anemia noted.  Otherwise CBC and CMP are normal.  I recommend use of multivitamin with iron.

## 2020-11-22 ENCOUNTER — Other Ambulatory Visit: Payer: Self-pay | Admitting: Rheumatology

## 2020-11-24 NOTE — Telephone Encounter (Signed)
Next Visit: 04/09/2020  Last Visit: 11/06/2020  Last Fill: 10/28/2020 (30 day supply)  DX: Rheumatoid arthritis involving multiple sites with positive rheumatoid factor  Current Dose per office note 11/06/2020: Imuran 75 mg by mouth daily  Labs: 11/06/2020 Mild anemia noted.  Otherwise CBC and CMP are normal.   Okay to refill Imuran?

## 2020-12-26 ENCOUNTER — Encounter: Payer: Self-pay | Admitting: Rheumatology

## 2021-02-26 ENCOUNTER — Encounter: Payer: Self-pay | Admitting: Rheumatology

## 2021-02-26 ENCOUNTER — Telehealth: Payer: Self-pay | Admitting: *Deleted

## 2021-02-26 NOTE — Telephone Encounter (Signed)
Labs received from:[facility]  Drawn on:02/19/2021  Reviewed by:Sherron Ales, PA-C  Labs drawn: Ferritin, CBC with diff, CMP, Vitamin D, Lipid Panel,   Results:Calc LDL 123     Non- HDL 133    All other results WNL  Patient is on Imuran 75 mg po daily.

## 2021-03-18 ENCOUNTER — Other Ambulatory Visit: Payer: Self-pay | Admitting: Physician Assistant

## 2021-03-18 NOTE — Telephone Encounter (Signed)
Next Visit: 04/09/2020   Last Visit: 11/06/2020   Last Fill: 11/24/2020   DX: Rheumatoid arthritis involving multiple sites with positive rheumatoid factor   Current Dose per office note 11/06/2020: Imuran 75 mg by mouth daily   Labs: 02/19/2021 Calc LDL 123                     Non- HDL 133                  All other results WNL   Okay to refill Imuran?

## 2021-03-25 ENCOUNTER — Telehealth: Payer: Self-pay | Admitting: Internal Medicine

## 2021-03-25 NOTE — Telephone Encounter (Signed)
Called to schedule a ILD-PRO Registry visit on 03/25/2020, patient said she has not been seen for [facility] since April 2022 and wants to have that scheduled and complete research visit that same day.

## 2021-03-26 NOTE — Telephone Encounter (Signed)
Patient is scheduled 05/07/2021 for spiro and dlco at 3pm and a 30 minute ov at 330pm. Reminder mailed to patient.

## 2021-03-26 NOTE — Telephone Encounter (Signed)
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  Last pulmonary function test April 2022  Plan  - Do spirometry and DLCO first available - Give 30-minute visit first available to see Dr. Marchelle Gearing -She probably needs reset schedule as well on the same day.  Copying [name]   PFT Results Latest Ref Rng & Units 07/08/2020 01/29/2020 07/31/2019 10/09/2018  FVC-Pre L 1.74 1.70 1.71 -  FVC-Predicted Pre % 60 59 59 65  FVC-Post L - - - 1.88  FVC-Predicted Post % - - - 65  Pre FEV1/FVC % % 92 93 89 84  Post FEV1/FCV % % - - - 92  FEV1-Pre L 1.61 1.58 1.52 1.57  FEV1-Predicted Pre % 69 67 64 67  FEV1-Post L - - - 1.73  DLCO uncorrected ml/min/mmHg 12.40 12.72 13.00 15.54  DLCO UNC% % 59 61 62 75  DLCO corrected ml/min/mmHg 12.90 13.43 13.00 -  DLCO COR %Predicted % 62 64 62 -  DLVA Predicted % 134 125 113 128  TLC L - - - 3.07  TLC % Predicted % - - - 63  RV % Predicted % - - - 74

## 2021-03-27 NOTE — Progress Notes (Deleted)
Office Visit Note  Patient: [name]             Date of Birth: 1975-09-06           MRN: [id number]             PCP: Shirlean Mylar, MD Referring: Shirlean Mylar, MD Visit Date: 04/09/2021 Occupation: @GUAROCC @  Subjective:  No chief complaint on file.   History of Present Illness: [name] is a 46 y.o. female ***   Activities of Daily Living:  Patient reports morning stiffness for *** {minute/hour:19697}.   Patient {ACTIONS;DENIES/REPORTS:21021675::"Denies"} nocturnal pain.  Difficulty dressing/grooming: {ACTIONS;DENIES/REPORTS:21021675::"Denies"} Difficulty climbing stairs: {ACTIONS;DENIES/REPORTS:21021675::"Denies"} Difficulty getting out of chair: {ACTIONS;DENIES/REPORTS:21021675::"Denies"} Difficulty using hands for taps, buttons, cutlery, and/or writing: {ACTIONS;DENIES/REPORTS:21021675::"Denies"}  No Rheumatology ROS completed.   PMFS History:  Patient Active Problem List   Diagnosis Date Noted   Abnormal cervical Papanicolaou smear 07/08/2020   Chronic fatigue syndrome 07/08/2020   Cyst of ovary 07/08/2020   Encounter for therapeutic drug level monitoring 07/08/2020   Hypothyroidism 07/08/2020   Irregular periods 07/08/2020   Menopause 07/08/2020   Mixed hyperlipidemia 07/08/2020   Oligomenorrhea 07/08/2020   Pain in pelvis 07/08/2020   Stenosis of cervix 07/08/2020   Vaginal high risk human papillomavirus (HPV) DNA test positive 07/08/2020   Vitamin D deficiency 07/08/2020   ILD (interstitial lung disease) (HCC) 02/20/2019   Restrictive lung disease 10/09/2018   Chronic cough 09/07/2018   RA (rheumatoid arthritis) (HCC) 09/07/2018   H/O: pneumonia    Dysplasia of cervix, low grade (CIN 1 and 2)     Past Medical History:  Diagnosis Date   Abnormal Pap smear    Dysplasia of cervix, low grade (CIN 1)    H/O: pneumonia    Hypothyroidism    Rheumatoid arthritis (HCC)     Family History  Problem Relation Age of Onset    Hypertension Mother    Diabetes Mother    Stroke Mother    Hypertension Father    Rheum arthritis Sister    Hypertension Brother    Healthy Son    Healthy Daughter    Rheum arthritis Maternal Aunt    Rheum arthritis Maternal Aunt    Past Surgical History:  Procedure Laterality Date   CESAREAN SECTION     LAPAROSCOPIC TUBAL LIGATION  02/01/2011   Procedure: LAPAROSCOPIC TUBAL LIGATION;  Surgeon: Purcell Nails, MD;  Location: [facility];  Service: Gynecology;  Laterality: Bilateral;   LEEP     Social History   Social History Narrative   Not on file   Immunization History  Administered Date(s) Administered   Influenza Split 12/19/2012, 03/05/2014, 11/24/2015, 11/28/2017, 12/28/2019   Influenza,inj,Quad PF,6+ Mos 12/29/2018   Influenza,inj,quad, With Preservative 01/10/2015   PFIZER(Purple Top)SARS-COV-2 Vaccination 05/25/2019, 06/15/2019, 02/01/2020   Td 08/14/2003, 07/02/2015   Tdap 03/05/2014     Objective: Vital Signs: LMP 08/26/2013    Physical Exam   Musculoskeletal Exam: ***  CDAI Exam: CDAI Score: -- Patient Global: --; Provider Global: -- Swollen: --; Tender: -- Joint Exam 04/09/2021   No joint exam has been documented for this visit   There is currently no information documented on the homunculus. Go to the Rheumatology activity and complete the homunculus joint exam.  Investigation: No additional findings.  Imaging: No results found.  Recent Labs: Lab Results  Component Value Date   WBC 4.3 11/06/2020   HGB 11.6 (L) 11/06/2020   PLT 263 11/06/2020   NA 140 11/06/2020  K 3.9 11/06/2020   CL 103 11/06/2020   CO2 28 11/06/2020   GLUCOSE 86 11/06/2020   BUN 17 11/06/2020   CREATININE 0.70 11/06/2020   BILITOT 0.5 11/06/2020   ALKPHOS 92 07/07/2007   AST 23 11/06/2020   ALT 8 11/06/2020   PROT 8.1 11/06/2020   ALBUMIN 1.8 (L) 07/07/2007   CALCIUM 10.1 11/06/2020   GFRAA 122 06/12/2020   QFTBGOLDPLUS NEGATIVE  03/07/2019    Speciality Comments: Treated with methotrexate (December 2019-February 2021)which was stopped due to shortness of breath. Imuran and started in February 2021  Procedures:  No procedures performed Allergies: Other   Assessment / Plan:     Visit Diagnoses: No diagnosis found.  Orders: No orders of the defined types were placed in this encounter.  No orders of the defined types were placed in this encounter.   Face-to-face time spent with patient was *** minutes. Greater than 50% of time was spent in counseling and coordination of care.  Follow-Up Instructions: No follow-ups on file.   Earnestine Mealing, CMA  Note - This record has been created using Editor, commissioning.  Chart creation errors have been sought, but may not always  have been located. Such creation errors do not reflect on  the standard of medical care.

## 2021-04-09 ENCOUNTER — Ambulatory Visit: Payer: 59 | Admitting: Rheumatology

## 2021-04-09 DIAGNOSIS — Z8701 Personal history of pneumonia (recurrent): Secondary | ICD-10-CM

## 2021-04-09 DIAGNOSIS — R768 Other specified abnormal immunological findings in serum: Secondary | ICD-10-CM

## 2021-04-09 DIAGNOSIS — R7989 Other specified abnormal findings of blood chemistry: Secondary | ICD-10-CM

## 2021-04-09 DIAGNOSIS — M7062 Trochanteric bursitis, left hip: Secondary | ICD-10-CM

## 2021-04-09 DIAGNOSIS — E049 Nontoxic goiter, unspecified: Secondary | ICD-10-CM

## 2021-04-09 DIAGNOSIS — Z8639 Personal history of other endocrine, nutritional and metabolic disease: Secondary | ICD-10-CM

## 2021-04-09 DIAGNOSIS — J849 Interstitial pulmonary disease, unspecified: Secondary | ICD-10-CM

## 2021-04-09 DIAGNOSIS — G8929 Other chronic pain: Secondary | ICD-10-CM

## 2021-04-09 DIAGNOSIS — K219 Gastro-esophageal reflux disease without esophagitis: Secondary | ICD-10-CM

## 2021-04-09 DIAGNOSIS — E559 Vitamin D deficiency, unspecified: Secondary | ICD-10-CM

## 2021-04-09 DIAGNOSIS — N870 Mild cervical dysplasia: Secondary | ICD-10-CM

## 2021-04-09 DIAGNOSIS — I7300 Raynaud's syndrome without gangrene: Secondary | ICD-10-CM

## 2021-04-09 DIAGNOSIS — M0579 Rheumatoid arthritis with rheumatoid factor of multiple sites without organ or systems involvement: Secondary | ICD-10-CM

## 2021-04-09 DIAGNOSIS — M940 Chondrocostal junction syndrome [Tietze]: Secondary | ICD-10-CM

## 2021-04-09 DIAGNOSIS — Z79899 Other long term (current) drug therapy: Secondary | ICD-10-CM

## 2021-04-14 NOTE — Progress Notes (Addendum)
Office Visit Note  Patient: [name]             Date of Birth: Mar 04, 1976           MRN: [id number]             PCP: Maurice Small, MD Referring: Maurice Small, MD Visit Date: 04/17/2021 Occupation: @GUAROCC @  Subjective:  Medication monitoring   History of Present Illness: [name] is a 46 y.o. female with history of seropositive rheumatoid arthritis and ILD.  She remains on Imuran 75 mg daily.  She is tolerating Imuran without any side effects and has not missed any doses recently.  She denies any signs or symptoms of a rheumatoid arthritis flare.  She has not had any increased joint pain or joint swelling.  She denies any morning stiffness or nocturnal pain.  She has not had any difficulty with ADLs.  She denies any lower back or hip discomfort at this time. She reports that she has upcoming appointment with Dr. Chase Caller on 05/07/2021.  At that time she will be having updated PFTs.  She denies any increased shortness of breath or cough.  She has not had any recent rashes, oral or nasal ulcerations, Raynaud's phenomenon, or hair loss.  She denies any new medical conditions.  She has not had any recent infections.   Activities of Daily Living:  Patient reports morning stiffness for 0 minutes.   Patient Denies nocturnal pain.  Difficulty dressing/grooming: Denies Difficulty climbing stairs: Denies Difficulty getting out of chair: Denies Difficulty using hands for taps, buttons, cutlery, and/or writing: Denies  Review of Systems  Constitutional:  Negative for fatigue.  HENT:  Negative for mouth sores, mouth dryness and nose dryness.   Eyes:  Negative for pain, itching and dryness.  Respiratory:  Negative for shortness of breath and difficulty breathing.   Cardiovascular:  Negative for chest pain and palpitations.  Gastrointestinal:  Negative for blood in stool, constipation and diarrhea.  Endocrine: Negative for increased urination.  Genitourinary:  Negative for  difficulty urinating.  Musculoskeletal:  Negative for joint pain, joint pain, joint swelling, myalgias, morning stiffness, muscle tenderness and myalgias.  Skin:  Negative for color change, rash and redness.  Allergic/Immunologic: Negative for susceptible to infections.  Neurological:  Positive for headaches. Negative for dizziness, numbness, memory loss and weakness.  Hematological:  Negative for bruising/bleeding tendency.  Psychiatric/Behavioral:  Negative for confusion.    PMFS History:  Patient Active Problem List   Diagnosis Date Noted   Abnormal cervical Papanicolaou smear 07/08/2020   Chronic fatigue syndrome 07/08/2020   Cyst of ovary 07/08/2020   Encounter for therapeutic drug level monitoring 07/08/2020   Hypothyroidism 07/08/2020   Irregular periods 07/08/2020   Menopause 07/08/2020   Mixed hyperlipidemia 07/08/2020   Oligomenorrhea 07/08/2020   Pain in pelvis 07/08/2020   Stenosis of cervix 07/08/2020   Vaginal high risk human papillomavirus (HPV) DNA test positive 07/08/2020   Vitamin D deficiency 07/08/2020   ILD (interstitial lung disease) ([place]) 02/20/2019   Restrictive lung disease 10/09/2018   Chronic cough 09/07/2018   RA (rheumatoid arthritis) ([place]) 09/07/2018   H/O: pneumonia    Dysplasia of cervix, low grade (CIN 1 and 2)     Past Medical History:  Diagnosis Date   Abnormal Pap smear    Dysplasia of cervix, low grade (CIN 1)    H/O: pneumonia    Hypothyroidism    Rheumatoid arthritis ([place])     Family History  Problem  Relation Age of Onset   Hypertension Mother    Diabetes Mother    Stroke Mother    Hypertension Father    Rheum arthritis Sister    Hypertension Brother    Healthy Son    Healthy Daughter    Rheum arthritis Maternal Aunt    Rheum arthritis Maternal Aunt    Past Surgical History:  Procedure Laterality Date   CESAREAN SECTION     LAPAROSCOPIC TUBAL LIGATION  02/01/2011   Procedure: LAPAROSCOPIC TUBAL LIGATION;  Surgeon: Delice Lesch, MD;  Location: [facility];  Service: Gynecology;  Laterality: Bilateral;   LEEP     Social History   Social History Narrative   Not on file   Immunization History  Administered Date(s) Administered   Influenza Split 12/19/2012, 03/05/2014, 11/24/2015, 11/28/2017, 12/28/2019   Influenza,inj,Quad PF,6+ Mos 12/29/2018   Influenza,inj,quad, With Preservative 01/10/2015   PFIZER(Purple Top)SARS-COV-2 Vaccination 05/25/2019, 06/15/2019, 02/01/2020   Td 08/14/2003, 07/02/2015   Tdap 03/05/2014     Objective: Vital Signs: BP (!) 148/83 (BP Location: Left Arm, Patient Position: Sitting, Cuff Size: Normal)    Pulse 73    Ht $R'5\' 1"'KQ$  (1.549 m)    Wt 156 lb 9.6 oz (71 kg)    LMP 08/26/2013    BMI 29.59 kg/m    Physical Exam Vitals and nursing note reviewed.  Constitutional:      Appearance: She is well-developed.  HENT:     Head: Normocephalic and atraumatic.  Eyes:     Conjunctiva/sclera: Conjunctivae normal.  Cardiovascular:     Rate and Rhythm: Normal rate and regular rhythm.     Heart sounds: Normal heart sounds.  Pulmonary:     Effort: Pulmonary effort is normal.     Breath sounds: Normal breath sounds.  Abdominal:     General: Bowel sounds are normal.     Palpations: Abdomen is soft.  Musculoskeletal:     Cervical back: Normal range of motion.  Lymphadenopathy:     Cervical: No cervical adenopathy.  Skin:    General: Skin is warm and dry.     Capillary Refill: Capillary refill takes less than 2 seconds.  Neurological:     Mental Status: She is alert and oriented to person, place, and time.  Psychiatric:        Behavior: Behavior normal.     Musculoskeletal Exam: C-spine, thoracic spine, lumbar spine have good range of motion with no discomfort.  Shoulder joints, elbow joints, wrist joints, MCPs, PIPs, DIPs have good range of motion with no synovitis.  Complete fist formation bilaterally.  Hip joints have good range of motion with no groin pain.  No tenderness palpation  over trochanteric bursa bilaterally.  Knee joints have good range of motion with no warmth or effusion.  Ankle joints have good range of motion with no tenderness or joint swelling.  No tenderness over MTP joints.  No evidence of Achilles tendinitis or plantar fasciitis.  CDAI Exam: CDAI Score: 0.2  Patient Global: 1 mm; Provider Global: 1 mm Swollen: 0 ; Tender: 0  Joint Exam 04/17/2021   No joint exam has been documented for this visit   There is currently no information documented on the homunculus. Go to the Rheumatology activity and complete the homunculus joint exam.  Investigation: No additional findings.  Imaging: No results found.  Recent Labs: Lab Results  Component Value Date   WBC 4.3 11/06/2020   HGB 11.6 (L) 11/06/2020   PLT 263 11/06/2020  NA 140 11/06/2020   K 3.9 11/06/2020   CL 103 11/06/2020   CO2 28 11/06/2020   GLUCOSE 86 11/06/2020   BUN 17 11/06/2020   CREATININE 0.70 11/06/2020   BILITOT 0.5 11/06/2020   ALKPHOS 92 07/07/2007   AST 23 11/06/2020   ALT 8 11/06/2020   PROT 8.1 11/06/2020   ALBUMIN 1.8 (L) 07/07/2007   CALCIUM 10.1 11/06/2020   GFRAA 122 06/12/2020   QFTBGOLDPLUS NEGATIVE 03/07/2019    Speciality Comments: Treated with methotrexate (December 2019-February 2021)which was stopped due to shortness of breath. Imuran and started in February 2021  Procedures:  No procedures performed Allergies: Other   Assessment / Plan:     Visit Diagnoses: Rheumatoid arthritis involving multiple sites with positive rheumatoid factor (HCC) - Positive RF, +14 3 3  eta and elevated ESR.  Diagnosed in 2019. MTX(12/19-02/21)-dcd after shortness of breath: She has not had any signs or symptoms of a rheumatoid arthritis flare.  She is clinically doing well taking Imuran 75 mg daily.  She is tolerating imuran without any side effects and has not missed any doses recently.  She is not experiencing any increased joint pain, morning stiffness, nocturnal  discomfort, or difficulty with ADLs.  She will remain on Imuran as monotherapy.  She was advised to notify us if she develops any signs or symptoms of a flare.  She will follow-up in the office in 5 months.  High risk medication use - Imuran 75 mg by mouth daily. (started in February 2021), previously on MTX 4 tablets by mouth once weekly and folic acid 1 mg po qd (d/c January 2021).   CBC and CMP were drawn on 02/19/21. She will be due to update lab work in March and every 3 months.  Standing orders for CBC and CMP remain in place. She has not had any recent infections.  Discussed the importance of holding Imuran if she develops signs or symptoms of an infection and to resume once the infection is completely cleared.  Positive ANA (antinuclear antibody) - ANA 1: 1280 speckled, positive Ro antibody.  No signs of systemic lupus at this time.  She has not had any recent rashes, oral or nasal ulcerations, sicca symptoms, Raynaud's phenomenon, shortness of breath, pleuritic chest pain, or hair loss.  Raynaud's syndrome without gangrene: Not currently active.  She has not had any symptoms of Raynaud's this winter.  No digital ulcerations or signs of gangrene were noted on exam.  No signs of sclerodactyly.  ILD (interstitial lung disease) (HCC) - Evaluated by Dr. Marchelle Gearing on 07/08/2020 for management of ILD.  She had an updated high-resolution chest CT on 05/01/2020: Unchanged mild pulmonary fibrosis.  PFTs updated on 07/08/2020.  She has an upcoming appoint with Dr. Marchelle Gearing on 05/07/2021 at which time she will be having updated PFTs. She is currently asymptomatic.  Her lungs were clear to auscultation on examination today.  She remains on Imuran 75 mg daily.  She was advised to notify us if she develops any new or worsening symptoms.  Chronic left SI joint pain - Resolved.  Trochanteric bursitis, left hip - Resolved.  No recurrence of symptoms after having a cortisone injection on 06/12/2020.  Elevated  LFTs  Other medical conditions are listed as follows:   Costochondritis  History of hyperlipidemia  History of hypothyroidism  Gastroesophageal reflux disease without esophagitis  Vitamin D deficiency: She is taking a calcium and vitamin D supplement daily.   Thyroid goiter  H/O: pneumonia  Dysplasia of  cervix, low grade (CIN 1 and 2)  Orders: No orders of the defined types were placed in this encounter.  No orders of the defined types were placed in this encounter.     Follow-Up Instructions: Return in about 5 months (around 09/14/2021) for Rheumatoid arthritis, ILD .   Ofilia Neas, PA-C  Note - This record has been created using Dragon software.  Chart creation errors have been sought, but may not always  have been located. Such creation errors do not reflect on  the standard of medical care.

## 2021-04-17 ENCOUNTER — Other Ambulatory Visit: Payer: Self-pay

## 2021-04-17 ENCOUNTER — Encounter: Payer: Self-pay | Admitting: Physician Assistant

## 2021-04-17 ENCOUNTER — Ambulatory Visit (INDEPENDENT_AMBULATORY_CARE_PROVIDER_SITE_OTHER): Payer: 59 | Admitting: Physician Assistant

## 2021-04-17 VITALS — BP 148/83 | HR 73 | Ht 61.0 in | Wt 156.6 lb

## 2021-04-17 DIAGNOSIS — J849 Interstitial pulmonary disease, unspecified: Secondary | ICD-10-CM

## 2021-04-17 DIAGNOSIS — I7300 Raynaud's syndrome without gangrene: Secondary | ICD-10-CM

## 2021-04-17 DIAGNOSIS — Z79899 Other long term (current) drug therapy: Secondary | ICD-10-CM

## 2021-04-17 DIAGNOSIS — R768 Other specified abnormal immunological findings in serum: Secondary | ICD-10-CM

## 2021-04-17 DIAGNOSIS — E559 Vitamin D deficiency, unspecified: Secondary | ICD-10-CM

## 2021-04-17 DIAGNOSIS — E049 Nontoxic goiter, unspecified: Secondary | ICD-10-CM

## 2021-04-17 DIAGNOSIS — M7062 Trochanteric bursitis, left hip: Secondary | ICD-10-CM

## 2021-04-17 DIAGNOSIS — M940 Chondrocostal junction syndrome [Tietze]: Secondary | ICD-10-CM

## 2021-04-17 DIAGNOSIS — R7689 Other specified abnormal immunological findings in serum: Secondary | ICD-10-CM

## 2021-04-17 DIAGNOSIS — Z8701 Personal history of pneumonia (recurrent): Secondary | ICD-10-CM

## 2021-04-17 DIAGNOSIS — K219 Gastro-esophageal reflux disease without esophagitis: Secondary | ICD-10-CM

## 2021-04-17 DIAGNOSIS — M0579 Rheumatoid arthritis with rheumatoid factor of multiple sites without organ or systems involvement: Secondary | ICD-10-CM | POA: Diagnosis not present

## 2021-04-17 DIAGNOSIS — G8929 Other chronic pain: Secondary | ICD-10-CM

## 2021-04-17 DIAGNOSIS — N870 Mild cervical dysplasia: Secondary | ICD-10-CM

## 2021-04-17 DIAGNOSIS — Z8639 Personal history of other endocrine, nutritional and metabolic disease: Secondary | ICD-10-CM

## 2021-04-17 DIAGNOSIS — R7989 Other specified abnormal findings of blood chemistry: Secondary | ICD-10-CM

## 2021-04-17 DIAGNOSIS — M533 Sacrococcygeal disorders, not elsewhere classified: Secondary | ICD-10-CM

## 2021-04-17 NOTE — Patient Instructions (Signed)
Standing Labs °We placed an order today for your standing lab work.  ° °Please have your standing labs drawn in March and every 3 months  ° °If possible, please have your labs drawn 2 weeks prior to your appointment so that the provider can discuss your results at your appointment. ° °Please note that you may see your imaging and lab results in MyChart before we have reviewed them. °We may be awaiting multiple results to interpret others before contacting you. °Please allow our office up to 72 hours to thoroughly review all of the results before contacting the office for clarification of your results. ° °We have open lab daily: °Monday through Thursday from 1:30-4:30 PM and Friday from 1:30-4:00 PM °at the office of Dr. Shaili Deveshwar, [facility].   °Please be advised, all patients with office appointments requiring lab work will take precedent over walk-in lab work.  °If possible, please come for your lab work on Monday and Friday afternoons, as you may experience shorter wait times. °The office is located at [address], [address], [place], [place] [address] °No appointment is necessary.   °Labs are drawn by [facility]. Please bring your co-pay at the time of your lab draw.  You may receive a bill from [facility] for your lab work. ° °Please note if you are on Hydroxychloroquine and and an order has been placed for a Hydroxychloroquine level, you will need to have it drawn 4 hours or more after your last dose. ° °If you wish to have your labs drawn at another location, please call the office 24 hours in advance to send orders. ° °If you have any questions regarding directions or hours of operation,  °please call [phone].   °As a reminder, please drink plenty of water prior to coming for your lab work. Thanks! ° °

## 2021-05-03 IMAGING — CT CT CHEST HIGH RESOLUTION W/O CM
2 of 8 series · 14 of 36 positions shown, 17 images · non-contrast
Comparison: 03/14/2019

CLINICAL DATA: Interstitial lung disease, history of rheumatoid
arthritis

EXAM:
CT CHEST WITHOUT CONTRAST
TECHNIQUE: Multidetector CT imaging of the chest was performed following the
standard protocol without intravenous contrast. High resolution
imaging of the lungs, as well as inspiratory and expiratory imaging,
was performed.

[Series 5: high resolution · axial · 0.61mm/px · z∈[-336,-126]mm · 11 of 253 slices shown, 14 images]
[im 22/253  mediastinal]
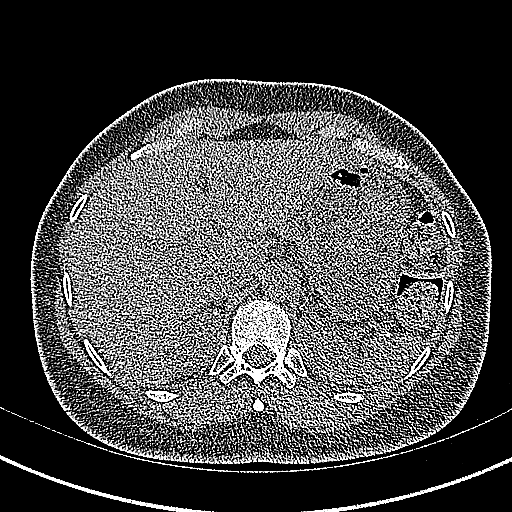
[im 22/253  lung]
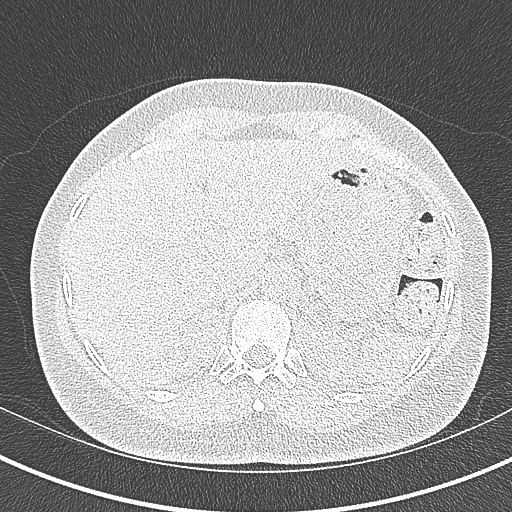
[im 43/253  lung]
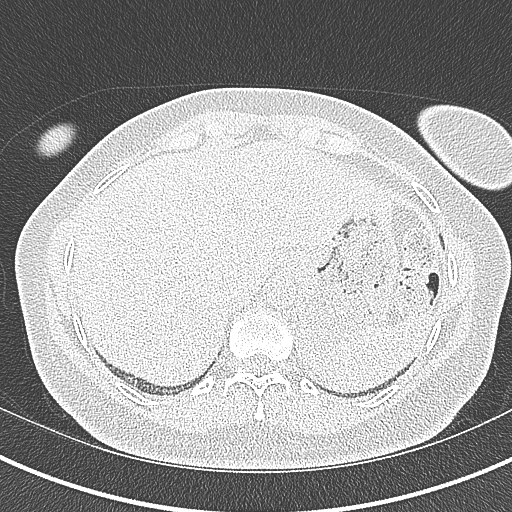
[im 64/253  lung]
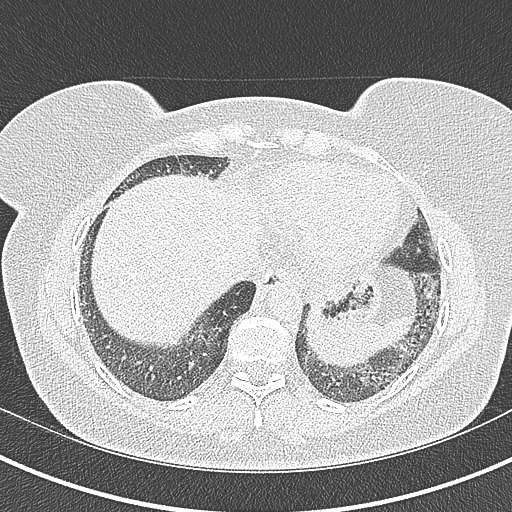
[im 85/253  lung]
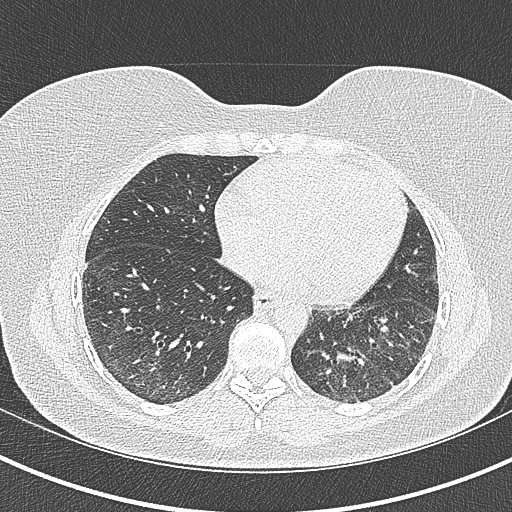
[im 106/253  mediastinal]
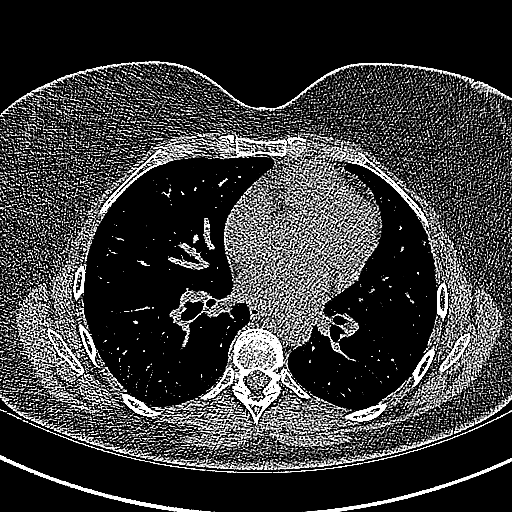
[im 106/253  lung]
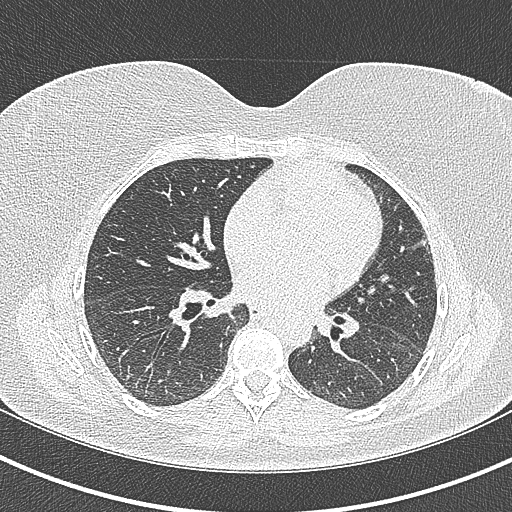
[im 127/253  lung]
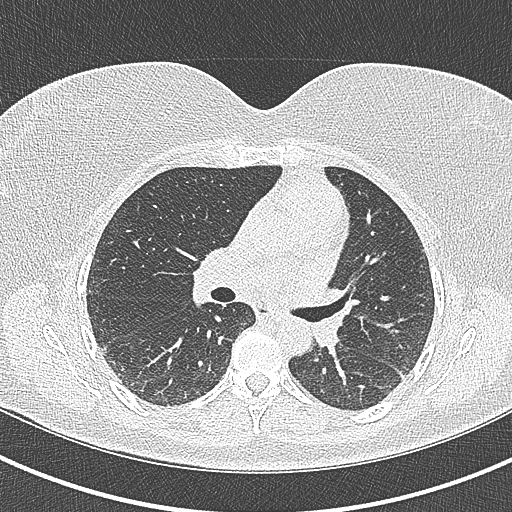
[im 148/253  lung]
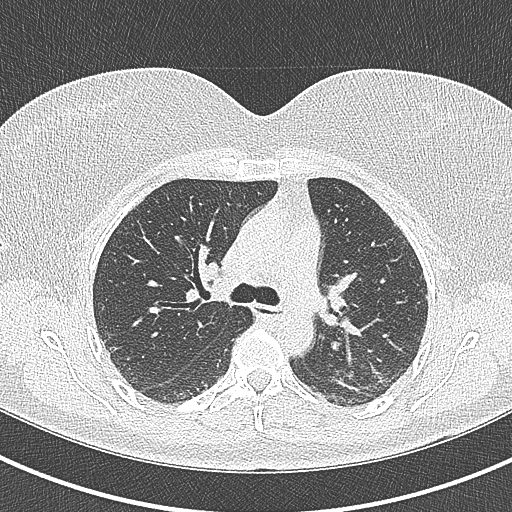
[im 169/253  lung]
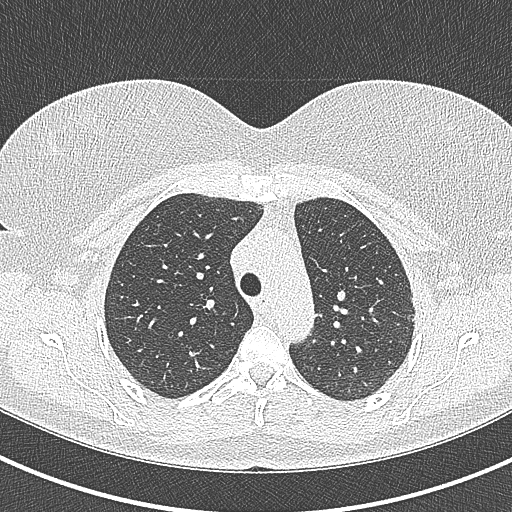
[im 190/253  mediastinal]
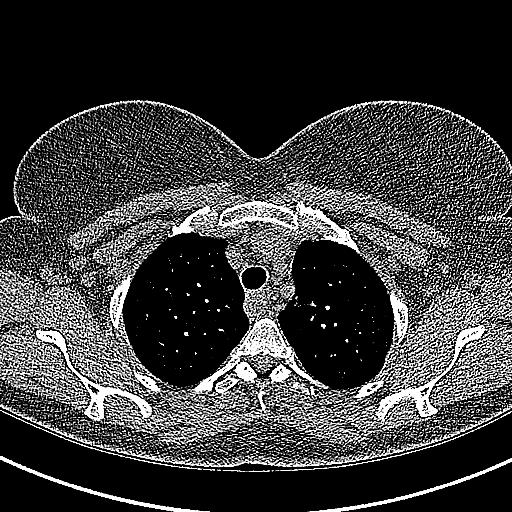
[im 190/253  lung]
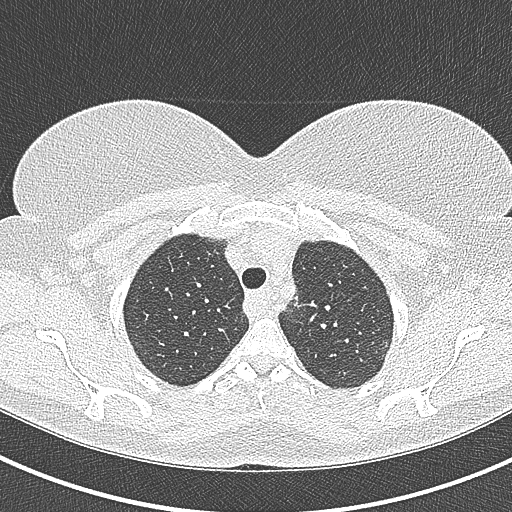
[im 211/253  lung]
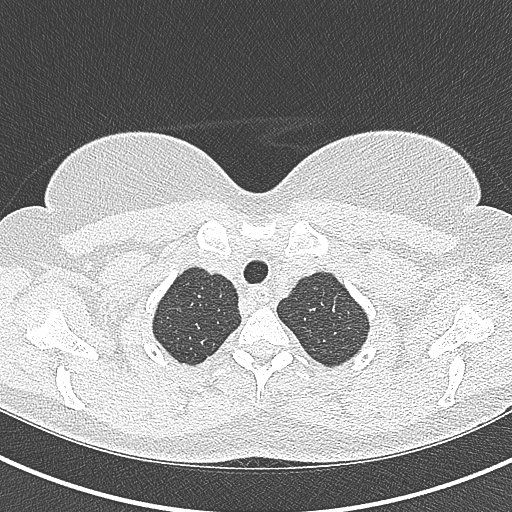
[im 232/253  lung]
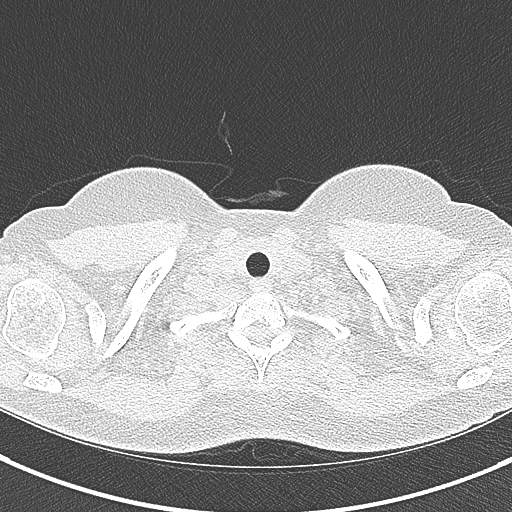

[Series 7: coronal · coronal · 0.52mm/px · 3 of 99 slices shown]
[im 20/99  lung]
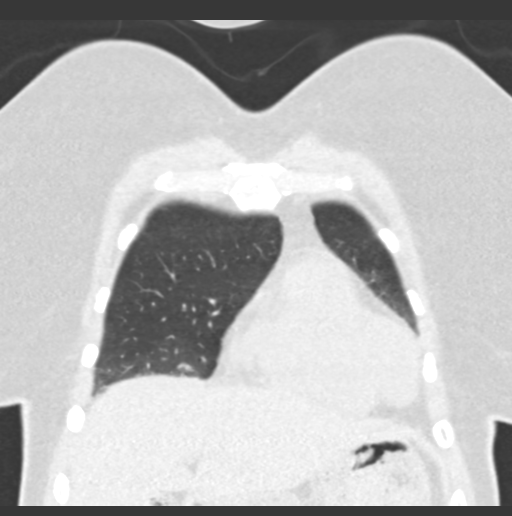
[im 40/99  lung]
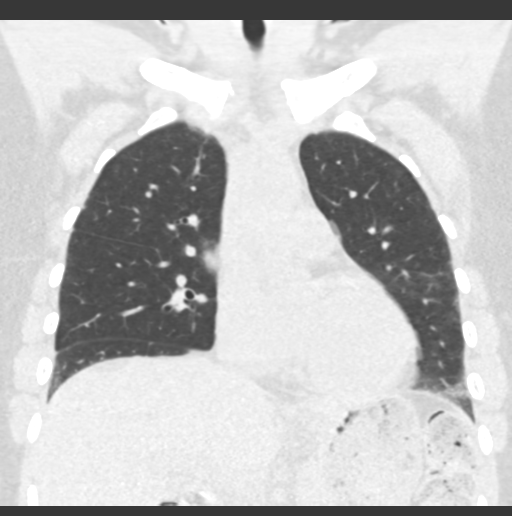
[im 59/99  lung]
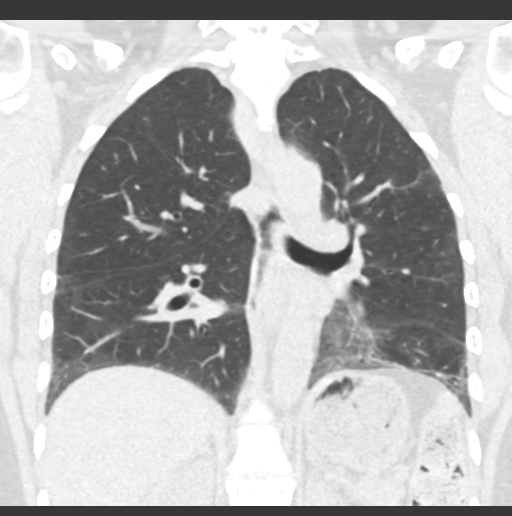

[14 of 36 positions shown; findings below may reference images not displayed]

FINDINGS: Cardiovascular: No significant vascular findings. Normal heart size.
No pericardial effusion.

Mediastinum/Nodes: Unchanged prominent axillary lymph nodes. No
enlarged mediastinal or hilar lymph nodes. Thyroid gland, trachea,
and esophagus demonstrate no significant findings.

Lungs/Pleura: Unchanged mild pulmonary fibrosis in a pattern
featuring apical to basal gradient, predominantly characterized by
irregular peripheral interstitial opacity and ground-glass, with
minimal subpleural bronchiolectasis particularly in the left lung
base. No significant air trapping on expiratory phase imaging.
Stable, definitively benign 5 mm nodule of the lingula (series 5,
image 150). No pleural effusion or pneumothorax.

Upper Abdomen: No acute abnormality.

Musculoskeletal: No chest wall mass or suspicious bone lesions
identified.
IMPRESSION: 1. Unchanged mild pulmonary fibrosis in a pattern featuring apical
to basal gradient, predominantly characterized by irregular
peripheral interstitial opacity and ground-glass, with minimal
subpleural bronchiolectasis particularly in the left lung base. No
significant air trapping on expiratory phase imaging. Findings
remain consistent with a "probable UIP" pattern of fibrosis by
pulmonary fibrosis criteria. Findings are categorized as probable
UIP per consensus guidelines: Diagnosis of Idiopathic Pulmonary
Fibrosis: An Official ATS/ERS/JRS/ALAT Clinical Practice Guideline.
Am J Respir Crit Care Med Vol 198, Juarbe 5, ppe11-e[DATE].
2. Stable, definitively benign 5 mm nodule of the lingula.

## 2021-05-06 ENCOUNTER — Other Ambulatory Visit: Payer: Self-pay | Admitting: *Deleted

## 2021-05-06 DIAGNOSIS — J849 Interstitial pulmonary disease, unspecified: Secondary | ICD-10-CM

## 2021-05-07 ENCOUNTER — Encounter: Payer: 59 | Admitting: *Deleted

## 2021-05-07 ENCOUNTER — Ambulatory Visit (INDEPENDENT_AMBULATORY_CARE_PROVIDER_SITE_OTHER): Payer: 59 | Admitting: Internal Medicine

## 2021-05-07 ENCOUNTER — Other Ambulatory Visit: Payer: Self-pay

## 2021-05-07 ENCOUNTER — Encounter: Payer: Self-pay | Admitting: Internal Medicine

## 2021-05-07 VITALS — BP 124/68 | HR 65 | Temp 98.1°F | Ht 63.0 in | Wt 155.0 lb

## 2021-05-07 DIAGNOSIS — J849 Interstitial pulmonary disease, unspecified: Secondary | ICD-10-CM

## 2021-05-07 DIAGNOSIS — Z79899 Other long term (current) drug therapy: Secondary | ICD-10-CM | POA: Diagnosis not present

## 2021-05-07 DIAGNOSIS — Z006 Encounter for examination for normal comparison and control in clinical research program: Secondary | ICD-10-CM

## 2021-05-07 LAB — PULMONARY FUNCTION TEST
DL/VA % pred: 127 %
DL/VA: 5.57 ml/min/mmHg/L
DLCO cor % pred: 64 %
DLCO cor: 13.23 ml/min/mmHg
DLCO unc % pred: 64 %
DLCO unc: 13.23 ml/min/mmHg
FEF 25-75 Pre: 3.24 L/sec
FEF2575-%Pred-Pre: 129 %
FEV1-%Pred-Pre: 69 %
FEV1-Pre: 1.6 L
FEV1FVC-%Pred-Pre: 116 %
FEV6-%Pred-Pre: 60 %
FEV6-Pre: 1.68 L
FEV6FVC-%Pred-Pre: 102 %
FVC-%Pred-Pre: 59 %
FVC-Pre: 1.68 L
Pre FEV1/FVC ratio: 95 %
Pre FEV6/FVC Ratio: 100 %

## 2021-05-07 NOTE — Patient Instructions (Signed)
Spirometry/DLCO performed today. 

## 2021-05-07 NOTE — Progress Notes (Signed)
Spirometry/DLCO performed today. 

## 2021-05-07 NOTE — Patient Instructions (Addendum)
ICD-10-CM   1. ILD (interstitial lung disease) (HCC)  J84.9   2. High risk medication use  Z79.899     Interstitial lung disease likely due to rheumatoid arthritis with or without acid reflux -progressive phenotype compared to July 2020 with subsequent decline in lung function in May 2021 but since then as of feb 2023 stable with symptoms, walk and DLCO test.  Unsure if there is progression in February Vital Capacity   Plan Continue control of acid reflux  -Sleep with head end of the bed elevated [best method is to elevate the head and using a breaker pillow at a 30 degree angle or even 20 degree angle]  - can do OTC prilosec 20mg  daily in empty stomach  -Continue Imuran via rheumatology -  should help with joints and also has antifibrotic effect  - Continue  ILD-PRO registry 05/07/2021  - Monitor ILD closely; do spirometry and dlco in 3 months  Follow-up -Return in 3 months after PFT testing  = symptom score and simple walk test at followup  -  If there is evidence of continued progression then we can discuss antifibrotic therapy

## 2021-05-07 NOTE — Progress Notes (Signed)
17/2020 --46 year old woman with RA on methotrexate, followed now for chronic cough.  She had pulmonary function testing done that showed mixed obstruction and restriction without a significant bronchodilator response.  We will treat her for reflux and her cough did improve some, did not resolve.  I tried her last time on empiric albuterol to see if she would get any benefit.  She reports that her cough is largely unchanged. She is not having dyspnea, no breakthrough GERD.  Based on the restriction on pulmonary function testing and her history of RA we obtained a CT scan of the chest 10/26/2018 that I reviewed.  This shows some mild but diffuse base-predominent groundglass change peripherally without any frank honeycombing most notable on the left.   Marella Chimes, [facility] > planning to do televisit Thurs, decrease MTX, possibly add a 2nd agent.   Video visit 02/20/2019 --46 year old woman with rheumatoid arthritis on methotrexate, mixed restriction and obstruction on pulmonary function testing and mild diffuse groundglass change peripherally on CT scan of the chest.  I saw her originally for chronic cough in the setting of these issues and also GERD. She has been followed by Dr Pearline Cables with Rheumatology. She was treated with prednisone $RemoveBeforeD'60mg'rGWofBXWUyvfyZ$ , tapering down now on $Remo'5mg'xhNzg$  a day. Remains on MTX $Remo'10mg'aZuwk$  once a week. Her cough has completely resolved. Breathing well.   ROV 04/26/2019 --Ms. Sayed is 9 and has a history of rheumatoid arthritis on methotrexate, mixed restriction and obstruction on PFTs.  Have seen her for chronic cough and an abnormal CT scan of the chest with peripheral groundglass changes.  She is on methotrexate 10 mg once a week, was treated with prednisone 60 mg tapering down to off as of 03/21/19. Now working w Dr Patrecia Pour. She started Imuran on 04/11/19.    We repeated her CT scan of the chest on 03/14/2019 and I have reviewed, this shows persistent areas of base predominant groundglass  and septal thickening, mild peripheral bronchiolectasis especially in the left base.  No frank honeycombing.  Stable compared with 10/26/2018.  There are a few scattered tiny pulmonary nodules that are stable.  OV 05/24/2019 -first visit has interstitial lung disease center.  Transfer of care from Dr. Baltazar Apo to the ILD center with Dr. Chase Caller.  Subjective:  Patient ID: Clydene Pugh, female , DOB: 02/26/1976 , age 47 y.o. , MRN: [id number] , ADDRESS: [address] [place] [address]  Rheumatologist - Dr Georga Hacking   05/24/2019 -   Chief Complaint  Patient presents with   Follow-up    Switching from RB to MR due to ILD. Pt states she does become SOB with exertion and states she does still have a cough when she laughs. Pt denies any complaints of CP.     History is gained from talking to the patient and review of the chart.   HPI [place] D Siess 46 y.o. -carries a diagnosis of rheumatoid arthritis.  Her sister also has rheumatoid arthritis.  She was diagnosed in 2019.  Initially treated with methotrexate.  Used to see Ms. Marella Chimes physician assistant at [facility] rheumatology but has switched to Dr. Bo Merino.  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 Tennis Must 2 days ago on 05/22/2019.  Note is that she is on 100 mg once daily of Imuran tolerating it well without any side effects.  No increase in joint pain after starting Imuran but she continues to experience stiffness and swelling in the morning for 20 to 30 minutes.  Raynard itself was not active.  In terms of her lung disease she has been seeing Dr. Lamonte Sakai since August 2020.  A CT scan in August  2020 showed left lower lobe groundglass opacity without any honeycombing.  Acid reflux which is a ongoing problem for her was considered as an etiology.  She also had cough and shortness of breath.  She was treated with 4 months of prednisone which ended early January 2021.  After that the cough is minimal.  She has occasional acid reflux.  In terms of her shortness of breath is very minimal as documented below.  Currently overall from a pulmonary standpoint she is feeling well.  Last pulmonary function testing was in July 2020 and last CT scan of the chest was in end of December 2020 and was essentially unchanged over 4 months.   She had questions about COVID-19 vaccine.  [place] [facility] Comprehensive ILD Questionnaire 0= filled out 06/01/2019   Symptoms: Insidious onset of shortness of breath the last 1 year.  Since it started it is the same.  There is mild cough.    Past Medical History :  -Rheumatoid arthritis diagnosed in 2019.  Has thyroid disease hypothyroidism diagnosed October 2009.  Has history of pneumonia not otherwise specified January 1998.  History is otherwise negative   ROS: Positive for joint stiffness and pain since July 2019 leading to the diagnosis of rheumatoid arthritis subsequently.  She had Raynaud's description once in January 2020.  She is ongoing acid reflux on and off since 2015   FAMILY HISTORY of LUNG DISEASE: Father has emphysema.  Sister has autoimmune disease.   EXPOSURE HISTORY: Denies cigarettes or marijuana or vaping or cocaine or intravenous drug use   HOME and HOBBY DETAILS : Single-family home for the last 16 years.  Age of the home is 17 years.  Does use a humidifier but no mildew in the humidifier.  There is some mildew in the shower curtains.  No mold in the [facility] duct.  No other organic antigen exposure in the house   OCCUPATIONAL HISTORY (122 questions) : Negative for organic antigen exposure.  Negative for inorganic antigen  exposure   PULMONARY TOXICITY HISTORY (27 items): On methotrexate between October 2019 and February 2021.  To prednisone between September 2020 and January 2021.  On Imuran since February 2021    Results for TAMSYN, OWUSU "DEE DEE" (MRN [id number]) as of 05/24/2019 12:22  Ref. Range 10/09/2018 12:56  FVC-%Pred-Pre Latest Units: % 65  FEV1-Pre Latest Units: L 1.57  Results for LASHONDRA, VAQUERANO "DEE DEE" (MRN [id number]) as of 05/24/2019 12:22  Ref. Range 10/09/2018 12:56  TLC Latest Units: L 3.07  TLC % pred Latest Units: % 63  Results for Zamarripa, Cherl D "DEE DEE" (MRN [id number]) as of 05/24/2019 12:22  Ref. Range 10/09/2018 12:56  DLCO unc Latest Units: ml/min/mmHg 15.54  DLCO unc % pred Latest Units: % 75    IMPRESSION: High-resolution CT chest December 2020 Lungs/Pleura: High-resolution images again demonstrate basal predominant areas of ground-glass attenuation, septal thickening, mild thickening of the peribronchovascular interstitium and some very mild peripheral bronchiolectasis (most evident in the left lower lobe).  No frank honeycombing. Findings are essentially stable compared to the recent prior study from 10/26/2018. Inspiratory and expiratory imaging is unremarkable. A few scattered tiny pulmonary nodules are noted in the lungs bilaterally, largest of which is a 5 mm nodule in the left upper lobe (axial image 79 of series 3). No other larger more suspicious appearing pulmonary nodules or masses are noted. No pleural effusions. No acute consolidative airspace disease.   1. The appearance of the lungs is compatible with interstitial lung disease, with a stable spectrum of findings technically classified as probable usual interstitial pneumonia (UIP) per current ATS guidelines. However, the possibility of nonspecific interstitial pneumonia is not excluded. Repeat high-resolution chest CT is recommended in 12 months to assess for temporal changes in the appearance  of the lung parenchyma. 2. Tiny pulmonary nodules measuring 5 mm or less in size, stable compared to the prior study. These are nonspecific, but statistically benign. Attention at time of repeat high-resolution chest CT is recommended to ensure continued stability.     Electronically Signed   By: Vinnie Langton M.D.   On: 03/14/2019 12:56 ROS - per HPI   OV 07/31/2019  Subjective:  Patient ID: Clydene Pugh, female , DOB: October 03, 1975 , age 67 y.o. 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 -returns for follow-up.  Since her last visit her symptoms are similar.  She reports better acid reflux control.  She had pulmonary function test that compared to a year ago shows decline documented below but she herself is feeling fine.  Her walking desaturation test is stable.  Her CT scan of the chest between August 2020 in December 2020 is stable.  She is currently on Imuran and is tolerating it well.  This is being monitored by Dr. Keturah Barre.  There are no other new issues.  She reports she had echocardiogram earlier in the year and this was normal.    OV 01/29/2020   Subjective:  Patient ID: Clydene Pugh, female , DOB: 1975-07-06, age 44 y.o. years. , MRN: [id number],  ADDRESS: [address] 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 -presents for follow-up.  She continues on  Imuran.  She has had a flu shot.  She plans to have a Covid booster.  She continues to be asymptomatic/minimally symptomatic as documented below.  Walking desaturation test is stable.  She had pulmonary function test that compared to the spring of this year shows stable FVC but a slight drop in DLCO.  However there is no change in her walking desaturation test or her symptoms.  The quality of the PFT appears good.  Overall is a decline PFT since last summer but in the interim she has been on Imuran which seems to be helping her.  She has no side effects from Imuran.  She is a little concerned about the drop in  DLCO but she is willing to work with our plan.     ROS    OV 07/08/2020  Subjective:  Patient ID: Clydene Pugh, female , DOB: 1976-01-04 , age 63 y.o. , MRN: [id number] , ADDRESS: [address] 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 -returns for follow-up.  Last visit I was concerned about more progression.  Today's symptom scores are stable, walking desaturation test is stable pulmonary function test is stable.  Symptom score is very mild.  High-resolution CT chest shows stable ILD.  No progression.  She is done her ILD-per registry visit today.  She is on Imuran and overall she is stable.    HRCT feb 2022  CLINICAL DATA:  Interstitial lung disease, history of rheumatoid arthritis   EXAM: CT CHEST WITHOUT CONTRAST    TECHNIQUE: Multidetector CT imaging of the chest was performed following the standard protocol without intravenous contrast. High resolution imaging of the lungs, as well as inspiratory and expiratory imaging, was performed.   COMPARISON:  03/14/2019   FINDINGS: Cardiovascular: No significant vascular findings. Normal heart size. No pericardial effusion.   Mediastinum/Nodes: Unchanged prominent axillary lymph nodes. No enlarged mediastinal or hilar lymph nodes. Thyroid gland, trachea, and esophagus demonstrate no significant findings.   Lungs/Pleura: Unchanged mild pulmonary fibrosis in a pattern featuring apical to basal gradient, predominantly characterized by irregular peripheral interstitial opacity and ground-glass, with minimal subpleural bronchiolectasis particularly in the left lung base. No significant air trapping on expiratory phase imaging. Stable, definitively benign 5 mm nodule of the lingula (series 5, image 150). No pleural effusion or pneumothorax.   Upper Abdomen: No acute abnormality.   Musculoskeletal: No chest wall mass or suspicious bone lesions identified.   IMPRESSION: 1. Unchanged mild pulmonary fibrosis in a pattern featuring apical to basal gradient, predominantly characterized by irregular peripheral interstitial opacity and ground-glass, with minimal subpleural bronchiolectasis particularly in the left lung base. No significant air trapping on expiratory phase imaging. 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 Findings are categorized as probable UIP per consensus guidelines: Diagnosis of Idiopathic Pulmonary Fibrosis: An Official ATS/ERS/JRS/ALAT Clinical Practice Guideline. [place], Iss 5, [id number], Nov 13 2016. 2. Stable, definitively benign 5 mm nodule of the lingula.     Electronically Signed   By: Eddie Candle M.D.   On: 05/01/2020 16:59      OV  05/07/2021  Subjective:  Patient ID: Clydene Pugh, female , DOB: 1975-10-04 , age 41 y.o. , MRN: [id number] , ADDRESS: [place] [address] PCP Maurice Small, MD Patient Care Team: Maurice Small, MD as PCP - General (Family Medicine)  This Provider for this visit: Treatment Team:  Attending Provider: Brand Males, MD    05/07/2021 -   Chief Complaint  Patient presents with   Follow-up    PFT performed today.  Pt states she has been  doing okay since last visit and denies any complaints.   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 Then sable through PFt feb 2023  -  Last high-resolution CT chest April 2022  and PFT feb 2023 Participant in ILD-pro registry On Imuran but not on antifibrotic  RA - Dr Dellia Nims  - Rheumatoid arthritis involving multiple sites with positive rheumatoid factor (HCC) - Positive RF, +$RemoveBefor'14 3 3 'GrKjojsutxXd$ eta and elevated ESR.  Diagnosed in 2019. MTX(12/19-02/21)-dcd after shortness of breath - Positive ANA (antinuclear antibody) - ANA 1: 1280 speckled, positive Ro antibody without SLE as of Jan 2023  HPI [place] D Oertel 46 y.o. -returns for follow-up.  Last seen 10 months ago.  Since then she has had a visit this year with Hazel Sams in rheumatology.  Imuran is being continued.  She feels quite well controlled with rheumatoid arthritis using Imuran.  There are no new problems.  She tells me in the last 1 year no emergency room visit no medication changes no hospitalizations no urgent care visits.  Shortness of breath is mild.  Walking desaturation test is stable.  Both symptom scores and walking desaturation test suggest stability.  Pulmonary function test shows DLCO is also stable but there is a slight drop in FVC.  We discussed drop in FVC do not know what to make of it     SYMPTOM SCALE - ILD 05/24/2019  07/31/2019  01/29/2020 Last Weight   Most recent update: 01/29/2020  2:12 PM     Weight  67.6 kg (149 lb)             07/08/2020  05/07/2021   O2 use ra ra ra ra ra  Shortness of Breath 0 -> 5 scale with 5 being worst (score 6 If unable to do)      At rest 0 0 0 0 0  Simple tasks - showers, clothes change, eating, shaving 1 0 0 0 0  Household (dishes, doing bed, laundry) 1 0 1 0 0  Shopping 0 0 0 0 0  Walking level at own pace 1 0 0 0 0  Walking up Stairs $RemoveB'1 2 2 1 1  'BzTETwAX$ Total (30-36) Dyspnea Score $RemoveBefor'4 2 2 1 1  'yaaPkALkqZWf$ How bad is your cough? 2 1 0 0 1 - durin laugh  How bad is your fatigue 1 0 0 0 0  How bad is nausea 00 0 0 0 0  How bad is vomiting?  0 0  0 0  How bad is diarrhea? 0 0 0 0 0  How bad is anxiety? 0 0 0 0 0  How bad is depression 0 0 0 0 0      Simple office walk 185 feet x  3 laps goal with forehead probe 05/24/2019  07/31/2019  ra 07/08/2020  05/07/2021   O2 used ra ra   ra  Number laps completed $RemoveBefore'3 3   3  'WSrNNGTCguALg$ Comments about pace avg Mod pace   Avg [ace  Resting Pulse Ox/HR 100% and 89/min 100% and 71 100% and 89/min 98% nand 97 100% and 65  Final Pulse Ox/HR 99% and 102/min 99% and 85 98% and 107/min 100% and 99/min 100% an 89  Desaturated </= 88% no      Desaturated <= 3% points no      Got Tachycardic >/= 90/min yes      Symptoms at end of test  Very mild dyspnea  No dysonea  No complaints  Miscellaneous comments x         PFT  PFT Results Latest Ref Rng & Units 05/07/2021 07/08/2020 01/29/2020 07/31/2019 10/09/2018  FVC-Pre L 1.68 1.74 1.70 1.71 -  FVC-Predicted Pre % 59 60 59 59 65  FVC-Post L - - - - 1.88  FVC-Predicted Post % - - - - 65  Pre FEV1/FVC % % 95 92 93 89 84  Post FEV1/FCV % % - - - - 92  FEV1-Pre L 1.60 1.61 1.58 1.52 1.57  FEV1-Predicted Pre % 69 69 67 64 67  FEV1-Post L - - - - 1.73  DLCO uncorrected ml/min/mmHg 13.23 12.40 12.72 13.00 15.54  DLCO UNC% % 64 59 61 62 75  DLCO corrected ml/min/mmHg 13.23 12.90 13.43 13.00 -  DLCO COR %Predicted % 64 62 64 62 -  DLVA Predicted % 127  134 125 113 128  TLC L - - - - 3.07  TLC % Predicted % - - - - 63  RV % Predicted % - - - - 74       has a past medical history of Abnormal Pap smear, Dysplasia of cervix, low grade (CIN 1), H/O: pneumonia, Hypothyroidism, and Rheumatoid arthritis ([place]).   reports that she has never smoked. She has never used smokeless tobacco.  Past Surgical History:  Procedure Laterality Date   CESAREAN SECTION     LAPAROSCOPIC TUBAL LIGATION  02/01/2011   Procedure: LAPAROSCOPIC TUBAL LIGATION;  Surgeon: Delice Lesch, MD;  Location: [facility];  Service: Gynecology;  Laterality: Bilateral;   LEEP      Allergies  Allergen Reactions   Other     Cats Fruit     Immunization History  Administered Date(s) Administered   Influenza Split 12/19/2012, 03/05/2014, 11/24/2015, 11/28/2017, 12/28/2019, 12/31/2020   Influenza,inj,Quad PF,6+ Mos 12/29/2018   Influenza,inj,quad, With Preservative 01/10/2015   PFIZER(Purple Top)SARS-COV-2 Vaccination 05/25/2019, 06/15/2019, 02/01/2020   Td 08/14/2003, 07/02/2015   Tdap 03/05/2014    Family History  Problem Relation Age of Onset   Hypertension Mother    Diabetes Mother    Stroke Mother    Hypertension Father    Rheum arthritis Sister    Hypertension Brother    Healthy Son    Healthy Daughter    Rheum arthritis Maternal Aunt    Rheum arthritis Maternal Aunt      Current Outpatient Medications:    aspirin-acetaminophen-caffeine (EXCEDRIN MIGRAINE) 250-250-65 MG tablet, Take by mouth as needed for headache., Disp: , Rfl:    azaTHIOprine (IMURAN) 50 MG tablet, TAKE 1 AND 1/2 TABLETS BY MOUTH DAILY, Disp: 135 tablet, Rfl: 0   Calcium-Vitamin D-Vitamin K (VIACTIV CALCIUM PLUS D) 650-12.5-40 MG-MCG-MCG CHEW, daily., Disp: , Rfl:    levothyroxine (SYNTHROID, LEVOTHROID) 50 MCG tablet, Take 50 mcg by mouth daily., Disp: , Rfl:    loratadine (CLARITIN) 10 MG tablet, TAKE 1 TABLET BY MOUTH EVERY DAY, Disp: 90 tablet, Rfl: 1   Multiple Vitamin  (MULTIVITAMIN WITH MINERALS) TABS tablet, Take 1 tablet by mouth daily., Disp: , Rfl:    omeprazole (PRILOSEC) 20 MG capsule, Take 20 mg by mouth daily., Disp: , Rfl:    Turmeric 1053 MG TABS, daily., Disp: , Rfl:       Objective:   Vitals:   05/07/21 1532  BP: 124/68  Pulse: 65  Temp: 98.1 F (36.7 C)  TempSrc: Oral  SpO2: 100%  Weight: 155 lb (70.3 kg)  Height: 5'  3" (1.6 m)    Estimated body mass index is 27.46 kg/m as calculated from the following:   Height as of this encounter: $RemoveBeforeD'5\' 3"'pMCjIwBxuypFKC$  (1.6 m).   Weight as of this encounter: 155 lb (70.3 kg).  $Rem'@WEIGHTCHANGE'GjUa$ @  [employer]   05/07/21 1532  Weight: 155 lb (70.3 kg)     Physical Exam    General: No distress. Looks well Neuro: Alert and Oriented x 3. GCS 15. Speech normal Psych: Pleasant Resp:  Barrel Chest - no.  Wheeze - no, Crackles - mayb, No overt respiratory distress CVS: Normal heart sounds. Murmurs - no Ext: Stigmata of Connective Tissue Disease - no HEENT: Normal upper airway. PEERL +. No post nasal drip        Assessment:       ICD-10-CM   1. ILD (interstitial lung disease) (HCC)  J84.9 Pulmonary function test    2. High risk medication use  Z79.899      Compared to 10 months ago her ILD is most likely stable on basis of symptom score walking desaturation test and stable DLCO.  There is a slight drop in forced vital capacity.  Typically with progressive ILD the DLCO drops first.  Overall I would deem her stable but given the slight change in [facility] which could be effort related potential continued stability of ILD I will see her sooner.  And so seeing her in 6 to 9 months we will see her in 3 months.  She is okay with this plan.  She will participate in the ILD-Pro registry visit today    Plan:     Patient Instructions     ICD-10-CM   1. ILD (interstitial lung disease) ([place])  J84.9   2. High risk medication use  Z79.899     Interstitial lung disease likely due to rheumatoid arthritis with or  without acid reflux -progressive phenotype compared to July 2020 with subsequent decline in lung function in May 2021 but since then as of feb 2023 stable with symptoms, walk and DLCO test.  Unsure if there is progression in February Vital Capacity   Plan Continue control of acid reflux  -Sleep with head end of the bed elevated [best method is to elevate the head and using a breaker pillow at a 30 degree angle or even 20 degree angle]  - can do OTC prilosec $RemoveBefo'20mg'MkUSmvZBQve$  daily in empty stomach  -Continue Imuran via rheumatology -  should help with joints and also has antifibrotic effect  - Continue  ILD-PRO registry 05/07/2021  - Monitor ILD closely; do spirometry and dlco in 3 months  Follow-up -Return in 3 months after PFT testing  = symptom score and simple walk test at followup  -  If there is evidence of continued progression then we can discuss antifibrotic therapy     SIGNATURE    Dr. Brand Males, M.D., F.C.C.P,  Pulmonary and Critical Care Medicine Staff Physician, [place] Director - Interstitial Lung Disease  Program  Pulmonary [place] at [place], [place], [address]  Pager: [phone], If no answer or between  15:00h - 7:00h: call [phone] Telephone: [phone]  5:18 PM 05/07/2021

## 2021-05-08 NOTE — Research (Signed)
Title: Chronic Fibrosing Interstitial Lung Disease with Progressive Phenotype Prospective Outcomes (ILD-PRO) Registry    Protocol #: IPF-PRO-SUB, Clinical Trials # [id number], Sponsor: [employer]   Protocol Version Amendment 4 dated 12Sep2019  and confirmed current on  Consent Version for todays visit date of  Is [facility] Approved Version 16 Feb 2018 Revised 16 Feb 2018   Objectives:  Describe current approaches to diagnosis and treatment of chronic fibrosing ILDs with progressive phenotype  Describe the natural history of chronic fibrosing ILDs with progressive phenotype  Assess quality of life from self-administered participant reported questionnaires for each disease group  Describe participant interactions with the healthcare system, describe treatment practices across multiple institutions for each disease group  Collect biological samples linked to well characterized chronic fibrosing ILDs with progressive phenotype to identify disease biomarkers  Collect data and biological samples that will support future research studies.                                            Key Inclusion Criteria: Willing and able to provide informed consent  Age ? 30 years  Diagnosis of a non-IPF ILD of any duration, including, but not limited to Idiopathic Non-Specific Interstitial Pneumonia (INSIP), Unclassifiable Idiopathic Interstitial Pneumonias (IIPs), Interstitial Pneumonia with Autoimmune Features (IPAF), Autoimmune ILDs such as Rheumatoid Arthritis (RA-ILD) and Systemic Sclerosis (SSC-ILD), Chronic Hypersensitivity Pneumonitis (HP), Sarcoidosis or Exposure-related ILDs such as asbestosis.  Chronic fibrosing ILD defined by reticular abnormality with traction bronchiectasis with or without honeycombing confirmed by chest HRCT scan and/or lung biopsy.  Progressive phenotype as defined by fulfilling at least one of the criteria below of fibrotic changes (progression set point)  within the last 24 months regardless of treatment considered appropriate in individual ILDs:  decline in FVC % predicted (% pred) based on >10% relative decline  decline in FVC % pred based on ? 5 - <10% relative decline in FVC combined with worsening of respiratory symptoms as assessed by the site investigator  decline in FVC % pred based on ? 5 - <10% relative decline in FVC combined with increasing extent of fibrotic changes on chest imaging (HRCT scan) as assessed by the site investigator  decline in DLCO % pred based on ? 10% relative decline  worsening of respiratory symptoms as well as increasing extent of fibrotic changes on chest imaging (HRCT scan) as assessed by the site investigator independent of FVC change.     Key Exclusion Criteria: Malignancy, treated or untreated, other than skin or early stage prostate cancer, within the past 5 years  Currently listed for lung transplantation at the time of enrollment  Currently enrolled in a clinical trial at the time of enrollment in this registry       Clinical Research Coordinator / Research RN note : This visit for [place] Subject [id number] with DOB: [birth year] on 05/07/2021 for the above protocol is Visit/Encounter #3 and is for purpose of research.    Subject expressed continued interest and consent in continuing as a study subject. Subject confirmed that there was no change in contact information (e.g. address, telephone, email). Subject thanked for participation in research and contribution to science.     During this visit on 05/07/2021 , the subject completed the blood work and questionnaires per the above referenced protocol. Please refer to the subject's paper source binder for further details.  Signed by Bari Mantis Research Assistant [facility]  [place], [place] 09:14 AM 05/08/2021

## 2021-06-15 ENCOUNTER — Other Ambulatory Visit: Payer: Self-pay | Admitting: *Deleted

## 2021-06-15 ENCOUNTER — Other Ambulatory Visit: Payer: Self-pay | Admitting: Physician Assistant

## 2021-06-15 DIAGNOSIS — Z79899 Other long term (current) drug therapy: Secondary | ICD-10-CM

## 2021-06-15 NOTE — Telephone Encounter (Signed)
Next Visit: 09/24/2021 ? ?Last Visit: 04/17/2021 ? ?Last Fill: 03/18/2021 ? ?DX: Rheumatoid arthritis involving multiple sites with positive rheumatoid factor  ? ?Current Dose per office note 04/17/2021: Imuran 75 mg by mouth daily.  ? ?Labs: 02/19/2021 Calc LDL 123  Non- HDL 133 All other results WNL ? ?Patient advised she is due to update labs. She states she will update this afternoon.  ? ?Okay to refill Imuran?  ?

## 2021-06-16 ENCOUNTER — Other Ambulatory Visit: Payer: Self-pay | Admitting: *Deleted

## 2021-06-16 DIAGNOSIS — R7989 Other specified abnormal findings of blood chemistry: Secondary | ICD-10-CM

## 2021-06-16 DIAGNOSIS — M0579 Rheumatoid arthritis with rheumatoid factor of multiple sites without organ or systems involvement: Secondary | ICD-10-CM

## 2021-06-16 DIAGNOSIS — J849 Interstitial pulmonary disease, unspecified: Secondary | ICD-10-CM

## 2021-06-16 DIAGNOSIS — R768 Other specified abnormal immunological findings in serum: Secondary | ICD-10-CM

## 2021-06-16 DIAGNOSIS — Z79899 Other long term (current) drug therapy: Secondary | ICD-10-CM

## 2021-06-16 DIAGNOSIS — E559 Vitamin D deficiency, unspecified: Secondary | ICD-10-CM

## 2021-06-16 DIAGNOSIS — I7300 Raynaud's syndrome without gangrene: Secondary | ICD-10-CM

## 2021-06-16 LAB — COMPLETE METABOLIC PANEL WITH GFR
AG Ratio: 1.1 (calc) (ref 1.0–2.5)
ALT: 11 U/L (ref 6–29)
AST: 29 U/L (ref 10–35)
Albumin: 4.4 g/dL (ref 3.6–5.1)
Alkaline phosphatase (APISO): 61 U/L (ref 31–125)
BUN: 15 mg/dL (ref 7–25)
CO2: 29 mmol/L (ref 20–32)
Calcium: 10.5 mg/dL — ABNORMAL HIGH (ref 8.6–10.2)
Chloride: 102 mmol/L (ref 98–110)
Creat: 0.72 mg/dL (ref 0.50–0.99)
Globulin: 4 g/dL (calc) — ABNORMAL HIGH (ref 1.9–3.7)
Glucose, Bld: 101 mg/dL — ABNORMAL HIGH (ref 65–99)
Potassium: 3.7 mmol/L (ref 3.5–5.3)
Sodium: 139 mmol/L (ref 135–146)
Total Bilirubin: 0.5 mg/dL (ref 0.2–1.2)
Total Protein: 8.4 g/dL — ABNORMAL HIGH (ref 6.1–8.1)
eGFR: 104 mL/min/{1.73_m2} (ref >=60)

## 2021-06-16 LAB — CBC WITH DIFFERENTIAL/PLATELET
Absolute Monocytes: 308 cells/uL (ref 200–950)
Basophils Absolute: 20 cells/uL (ref 0–200)
Basophils Relative: 0.5 %
Eosinophils Absolute: 187 cells/uL (ref 15–500)
Eosinophils Relative: 4.8 %
HCT: 37.2 % (ref 35.0–45.0)
Hemoglobin: 12 g/dL (ref 11.7–15.5)
Lymphs Abs: 1603 cells/uL (ref 850–3900)
MCH: 28.2 pg (ref 27.0–33.0)
MCHC: 32.3 g/dL (ref 32.0–36.0)
MCV: 87.5 fL (ref 80.0–100.0)
MPV: 11 fL (ref 7.5–12.5)
Monocytes Relative: 7.9 %
Neutro Abs: 1782 cells/uL (ref 1500–7800)
Neutrophils Relative %: 45.7 %
Platelets: 286 10*3/uL (ref 140–400)
RBC: 4.25 10*6/uL (ref 3.80–5.10)
RDW: 13.4 % (ref 11.0–15.0)
Total Lymphocyte: 41.1 %
WBC: 3.9 10*3/uL (ref 3.8–10.8)

## 2021-06-16 NOTE — Progress Notes (Signed)
Calcium is borderline elevated-10.5.  she should avoid the use of calcium and vitamin D supplements.   total protein is elevated. Globulin remains elevated-stable.   ?Please add SPEP to future lab orders.

## 2021-06-16 NOTE — Progress Notes (Signed)
CBC WNL

## 2021-06-21 ENCOUNTER — Encounter: Payer: Self-pay | Admitting: Rheumatology

## 2021-06-22 NOTE — Telephone Encounter (Signed)
Ok to hold imuran until completing the course of antibiotics.  Ok to resume imuran once her symptoms have completely cleared.

## 2021-08-03 ENCOUNTER — Encounter: Payer: Self-pay | Admitting: Internal Medicine

## 2021-08-05 ENCOUNTER — Ambulatory Visit: Payer: 59 | Admitting: Internal Medicine

## 2021-09-11 ENCOUNTER — Other Ambulatory Visit: Payer: Self-pay | Admitting: Physician Assistant

## 2021-09-11 NOTE — Telephone Encounter (Signed)
Next Visit: 09/24/2021   Last Visit: 04/17/2021   Last Fill: 06/15/2021   DX: Rheumatoid arthritis involving multiple sites with positive rheumatoid factor    Current Dose per office note 04/17/2021: Imuran 75 mg by mouth daily.    Labs: 06/15/2021 Calcium is borderline elevated-10.5.  total protein is elevated. Globulin remains elevated-stable.     Okay to refill Imuran?

## 2021-09-16 NOTE — Progress Notes (Signed)
Office Visit Note  Patient: [name]             Date of Birth: 1975/09/12           MRN: [id number]             PCP: Maurice Small, MD Referring: Maurice Small, MD Visit Date: 09/24/2021 Occupation: _0 @  Subjective:  Medication management  History of Present Illness: [name] is a 46 y.o. female with history of rheumatoid arthritis and ILD.  She denies any joint pain or joint swelling.  She denies any increased shortness of breath.  She has been taking Imuran 75 mg p.o. daily without any side effects.  She denies any Raynauds symptoms.  She states she still continues to have heartburn from reflux.  She is scheduled to have an EGD. Patient was evaluated by Dr. Chase Caller on May 07, 2021 according to his notes her ILD is a stable based on the PFTs and no change in her DLCO.  Walking desaturation test was also stable.  He recommended follow-up in 6 to 9 months.  He also recommended reflux precautions.  Activities of Daily Living:  Patient reports morning stiffness for 0 minutes.   Patient Denies nocturnal pain.  Difficulty dressing/grooming: Denies Difficulty climbing stairs: Denies Difficulty getting out of chair: Denies Difficulty using hands for taps, buttons, cutlery, and/or writing: Denies  Review of Systems  Constitutional:  Negative for fatigue.  HENT:  Positive for mouth dryness. Negative for mouth sores.   Eyes:  Negative for dryness.  Respiratory:  Negative for shortness of breath.   Cardiovascular:  Negative for chest pain and palpitations.  Gastrointestinal:  Negative for blood in stool, constipation and diarrhea.  Endocrine: Negative for increased urination.  Genitourinary:  Negative for involuntary urination.  Musculoskeletal:  Negative for joint pain, joint pain, joint swelling, myalgias, muscle weakness, morning stiffness, muscle tenderness and myalgias.  Skin:  Negative for color change, rash, hair loss and sensitivity to sunlight.   Allergic/Immunologic: Negative for susceptible to infections.  Neurological:  Negative for dizziness, numbness and weakness.  Hematological:  Negative for swollen glands.  Psychiatric/Behavioral:  Negative for depressed mood and sleep disturbance. The patient is not nervous/anxious.     PMFS History:  Patient Active Problem List   Diagnosis Date Noted   Abnormal cervical Papanicolaou smear 07/08/2020   Chronic fatigue syndrome 07/08/2020   Cyst of ovary 07/08/2020   Encounter for therapeutic drug level monitoring 07/08/2020   Hypothyroidism 07/08/2020   Irregular periods 07/08/2020   Menopause 07/08/2020   Mixed hyperlipidemia 07/08/2020   Oligomenorrhea 07/08/2020   Pain in pelvis 07/08/2020   Stenosis of cervix 07/08/2020   Vaginal high risk human papillomavirus (HPV) DNA test positive 07/08/2020   Vitamin D deficiency 07/08/2020   ILD (interstitial lung disease) ([place]) 02/20/2019   Restrictive lung disease 10/09/2018   Chronic cough 09/07/2018   RA (rheumatoid arthritis) ([place]) 09/07/2018   H/O: pneumonia    Dysplasia of cervix, low grade (CIN 1 and 2)     Past Medical History:  Diagnosis Date   Abnormal Pap smear    Dysplasia of cervix, low grade (CIN 1)    H/O: pneumonia    Hypothyroidism    Rheumatoid arthritis ([place])     Family History  Problem Relation Age of Onset   Hypertension Mother    Diabetes Mother    Stroke Mother    Hypertension Father    Rheum arthritis Sister    Hypertension Brother  Healthy Son    Healthy Daughter    Rheum arthritis Maternal Aunt    Rheum arthritis Maternal Aunt    Past Surgical History:  Procedure Laterality Date   CESAREAN SECTION     LAPAROSCOPIC TUBAL LIGATION  02/01/2011   Procedure: LAPAROSCOPIC TUBAL LIGATION;  Surgeon: Delice Lesch, MD;  Location: [facility];  Service: Gynecology;  Laterality: Bilateral;   LEEP     Social History   Social History Narrative   Not on file   Immunization History  Administered  Date(s) Administered   Influenza Split 12/19/2012, 03/05/2014, 11/24/2015, 11/28/2017, 12/28/2019, 12/31/2020   Influenza,inj,Quad PF,6+ Mos 12/29/2018   Influenza,inj,quad, With Preservative 01/10/2015   PFIZER(Purple Top)SARS-COV-2 Vaccination 05/25/2019, 06/15/2019, 02/01/2020   Td 08/14/2003, 07/02/2015   Tdap 03/05/2014     Objective: Vital Signs: BP (!) 146/93 (BP Location: Left Arm, Patient Position: Sitting, Cuff Size: Small)   Pulse 66   Resp 12   Ht 5' 1" (1.549 m)   Wt 149 lb 9.6 oz (67.9 kg)   LMP 08/26/2013   BMI 28.27 kg/m    Physical Exam Vitals and nursing note reviewed.  Constitutional:      Appearance: She is well-developed.  HENT:     Head: Normocephalic and atraumatic.  Eyes:     Conjunctiva/sclera: Conjunctivae normal.  Cardiovascular:     Rate and Rhythm: Normal rate and regular rhythm.     Heart sounds: Normal heart sounds.  Pulmonary:     Effort: Pulmonary effort is normal.     Breath sounds: Normal breath sounds.  Abdominal:     General: Bowel sounds are normal.     Palpations: Abdomen is soft.  Musculoskeletal:     Cervical back: Normal range of motion.  Lymphadenopathy:     Cervical: No cervical adenopathy.  Skin:    General: Skin is warm and dry.     Capillary Refill: Capillary refill takes less than 2 seconds.  Neurological:     Mental Status: She is alert and oriented to person, place, and time.  Psychiatric:        Behavior: Behavior normal.      Musculoskeletal Exam: C-spine thoracic and lumbar spine were in good range of motion.  Shoulder joints, elbow joints, wrist joints, MCPs PIPs and DIPs with good range of motion with no synovitis.  Hip joints, knee joints, ankles, MTPs and PIPs with good range of motion with no synovitis.  CDAI Exam: CDAI Score: -- Patient Global: --; Provider Global: -- Swollen: --; Tender: -- Joint Exam 09/24/2021   No joint exam has been documented for this visit   There is currently no information  documented on the homunculus. Go to the Rheumatology activity and complete the homunculus joint exam.  Investigation: No additional findings.  Imaging: No results found.  Recent Labs: Lab Results  Component Value Date   WBC 3.8 09/18/2021   HGB 11.3 (L) 09/18/2021   PLT 269 09/18/2021   NA 140 09/18/2021   K 3.7 09/18/2021   CL 104 09/18/2021   CO2 29 09/18/2021   GLUCOSE 119 (H) 09/18/2021   BUN 18 09/18/2021   CREATININE 0.76 09/18/2021   BILITOT 0.5 09/18/2021   ALKPHOS 92 07/07/2007   AST 20 09/18/2021   ALT 7 09/18/2021   PROT 8.5 (H) 09/18/2021   PROT 8.3 (H) 09/18/2021   ALBUMIN 1.8 (L) 07/07/2007   CALCIUM 9.7 09/18/2021   GFRAA 122 06/12/2020   QFTBGOLDPLUS NEGATIVE 03/07/2019   IMPRESSION: 1. Unchanged  mild pulmonary fibrosis in a pattern featuring apical to basal gradient, predominantly characterized by irregular peripheral interstitial opacity and ground-glass, with minimal subpleural bronchiolectasis particularly in the left lung base. No significant air trapping on expiratory phase imaging. 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 Findings are categorized as probable UIP per consensus guidelines: Diagnosis of Idiopathic Pulmonary Fibrosis: An Official ATS/ERS/JRS/ALAT Clinical Practice Guideline. [place], Iss 5, [id number], Nov 13 2016. 2. Stable, definitively benign 5 mm nodule of the lingula.     Electronically Signed   By: Eddie Candle M.D.   On: 05/01/2020 16:59  Speciality Comments: Treated with methotrexate (December 2019-February 2021)which was stopped due to shortness of breath. Imuran and started in February 2021  Procedures:  No procedures performed Allergies: Other   Assessment / Plan:     Visit Diagnoses: Rheumatoid arthritis involving multiple sites with positive rheumatoid factor (HCC) - Positive RF, +_0 eta and elevated ESR.  Diagnosed in 2019.  MTX(12/19-02/21)-dcd after shortness of breath: Patient has no synovitis on my examination.  He denies any history of joint pain or joint swelling today.  She has been tolerating Imuran well.  High risk medication use - Imuran 75 mg by mouth daily. (started in February 2021), previously on MTX 4 tablets by mouth once weekly and folic acid 1 mg po qd (d/c January 2021).  Labs obtained on September 18, 2021 were reviewed hemoglobin was low at 11.7.  CMP was normal.  _1 eta was positive consistent with rheumatoid arthritis.  Information on immunization was placed in the AVS.  She was advised to hold Imuran if she develops an infection and resume after the infection resolves.  Sjogren's syndrome- ANA 1: 1280 speckled, positive Ro antibody.  She continues to have dry mouth.  She denies any symptoms of dry eyes.  Over-the-counter products were discussed.  The symptoms are manageable.  Good dental hygiene was discussed.  Raynaud's syndrome without gangrene-currently not active.  ILD (interstitial lung disease) ([place]) -she was evaluated by Dr. Chase Caller in April 2023.  I reviewed the records.  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  It showed mild pulmonary fibrosis.  Her last PFTs showed stable DLCO.  Dr. Chase Caller he did not recommend any change in therapy.  History of hyperlipidemia-dietary modifications were discussed.  Gastroesophageal reflux disease without esophagitis-she has been experiencing increased reflux symptoms.  According to the patient.  She is scheduled to have endoscopy.  She is on omeprazole 20 mg p.o. daily.  Thyroid goiter  History of hypothyroidism-she is on levothyroxine.  Vitamin D deficiency-she takes calcium with vitamin D.  Dysplasia of cervix, low grade (CIN 1 and 2)-followed by GYN.  Orders: No orders of the defined types were placed in this encounter.  No orders of the defined types were placed in this encounter.    Follow-Up  Instructions: Return in about 5 months (around 02/24/2022) for Rheumatoid arthritis, ILD.   Bo Merino, MD  Note - This record has been created using Editor, commissioning.  Chart creation errors have been sought, but may not always  have been located. Such creation errors do not reflect on  the standard of medical care.

## 2021-09-18 ENCOUNTER — Other Ambulatory Visit: Payer: Self-pay

## 2021-09-18 DIAGNOSIS — Z79899 Other long term (current) drug therapy: Secondary | ICD-10-CM

## 2021-09-20 NOTE — Progress Notes (Signed)
Mild anemia noted. CMP is stable.

## 2021-09-22 LAB — CBC WITH DIFFERENTIAL/PLATELET
Absolute Monocytes: 270 cells/uL (ref 200–950)
Basophils Absolute: 19 cells/uL (ref 0–200)
Basophils Relative: 0.5 %
Eosinophils Absolute: 99 cells/uL (ref 15–500)
Eosinophils Relative: 2.6 %
HCT: 35.3 % (ref 35.0–45.0)
Hemoglobin: 11.3 g/dL — ABNORMAL LOW (ref 11.7–15.5)
Lymphs Abs: 1505 cells/uL (ref 850–3900)
MCH: 27.8 pg (ref 27.0–33.0)
MCHC: 32 g/dL (ref 32.0–36.0)
MCV: 86.9 fL (ref 80.0–100.0)
MPV: 10.5 fL (ref 7.5–12.5)
Monocytes Relative: 7.1 %
Neutro Abs: 1908 cells/uL (ref 1500–7800)
Neutrophils Relative %: 50.2 %
Platelets: 269 10*3/uL (ref 140–400)
RBC: 4.06 10*6/uL (ref 3.80–5.10)
RDW: 13.6 % (ref 11.0–15.0)
Total Lymphocyte: 39.6 %
WBC: 3.8 10*3/uL (ref 3.8–10.8)

## 2021-09-22 LAB — COMPLETE METABOLIC PANEL WITH GFR
AG Ratio: 1.3 (calc) (ref 1.0–2.5)
ALT: 7 U/L (ref 6–29)
AST: 20 U/L (ref 10–35)
Albumin: 4.7 g/dL (ref 3.6–5.1)
Alkaline phosphatase (APISO): 56 U/L (ref 31–125)
BUN: 18 mg/dL (ref 7–25)
CO2: 29 mmol/L (ref 20–32)
Calcium: 9.7 mg/dL (ref 8.6–10.2)
Chloride: 104 mmol/L (ref 98–110)
Creat: 0.76 mg/dL (ref 0.50–0.99)
Globulin: 3.6 g/dL (calc) (ref 1.9–3.7)
Glucose, Bld: 119 mg/dL — ABNORMAL HIGH (ref 65–99)
Potassium: 3.7 mmol/L (ref 3.5–5.3)
Sodium: 140 mmol/L (ref 135–146)
Total Bilirubin: 0.5 mg/dL (ref 0.2–1.2)
Total Protein: 8.3 g/dL — ABNORMAL HIGH (ref 6.1–8.1)
eGFR: 97 mL/min/{1.73_m2} (ref >=60)

## 2021-09-22 LAB — PROTEIN ELECTROPHORESIS, SERUM
Albumin ELP: 4.5 g/dL (ref 3.8–4.8)
Alpha 1: 0.3 g/dL (ref 0.2–0.3)
Alpha 2: 0.7 g/dL (ref 0.5–0.9)
Beta 2: 0.5 g/dL (ref 0.2–0.5)
Beta Globulin: 0.4 g/dL (ref 0.4–0.6)
Gamma Globulin: 2 g/dL — ABNORMAL HIGH (ref 0.8–1.7)
Total Protein: 8.5 g/dL — ABNORMAL HIGH (ref 6.1–8.1)

## 2021-09-22 NOTE — Progress Notes (Signed)
SPEP is normal.

## 2021-09-24 ENCOUNTER — Encounter: Payer: Self-pay | Admitting: Rheumatology

## 2021-09-24 ENCOUNTER — Ambulatory Visit (INDEPENDENT_AMBULATORY_CARE_PROVIDER_SITE_OTHER): Payer: 59 | Admitting: Rheumatology

## 2021-09-24 VITALS — BP 146/93 | HR 66 | Resp 12 | Ht 61.0 in | Wt 149.6 lb

## 2021-09-24 DIAGNOSIS — M0579 Rheumatoid arthritis with rheumatoid factor of multiple sites without organ or systems involvement: Secondary | ICD-10-CM

## 2021-09-24 DIAGNOSIS — M3509 Sicca syndrome with other organ involvement: Secondary | ICD-10-CM | POA: Diagnosis not present

## 2021-09-24 DIAGNOSIS — R768 Other specified abnormal immunological findings in serum: Secondary | ICD-10-CM

## 2021-09-24 DIAGNOSIS — J849 Interstitial pulmonary disease, unspecified: Secondary | ICD-10-CM

## 2021-09-24 DIAGNOSIS — K219 Gastro-esophageal reflux disease without esophagitis: Secondary | ICD-10-CM

## 2021-09-24 DIAGNOSIS — E559 Vitamin D deficiency, unspecified: Secondary | ICD-10-CM

## 2021-09-24 DIAGNOSIS — R7989 Other specified abnormal findings of blood chemistry: Secondary | ICD-10-CM

## 2021-09-24 DIAGNOSIS — Z79899 Other long term (current) drug therapy: Secondary | ICD-10-CM

## 2021-09-24 DIAGNOSIS — N870 Mild cervical dysplasia: Secondary | ICD-10-CM

## 2021-09-24 DIAGNOSIS — E049 Nontoxic goiter, unspecified: Secondary | ICD-10-CM

## 2021-09-24 DIAGNOSIS — Z8639 Personal history of other endocrine, nutritional and metabolic disease: Secondary | ICD-10-CM

## 2021-09-24 DIAGNOSIS — I7300 Raynaud's syndrome without gangrene: Secondary | ICD-10-CM

## 2021-09-24 DIAGNOSIS — Z8701 Personal history of pneumonia (recurrent): Secondary | ICD-10-CM

## 2021-09-24 DIAGNOSIS — M940 Chondrocostal junction syndrome [Tietze]: Secondary | ICD-10-CM

## 2021-09-24 DIAGNOSIS — M7062 Trochanteric bursitis, left hip: Secondary | ICD-10-CM

## 2021-09-24 DIAGNOSIS — G8929 Other chronic pain: Secondary | ICD-10-CM

## 2021-09-24 NOTE — Patient Instructions (Signed)
Standing Labs We placed an order today for your standing lab work.   Please have your standing labs drawn in October and every 3 months  If possible, please have your labs drawn 2 weeks prior to your appointment so that the provider can discuss your results at your appointment.  Please note that you may see your imaging and lab results in MyChart before we have reviewed them. We may be awaiting multiple results to interpret others before contacting you. Please allow our office up to 72 hours to thoroughly review all of the results before contacting the office for clarification of your results.  We have open lab daily: Monday through Thursday from 1:30-4:30 PM and Friday from 1:30-4:00 PM at the office of Dr. Garrus Gauthreaux, [facility].   Please be advised, all patients with office appointments requiring lab work will take precedent over walk-in lab work.  If possible, please come for your lab work on Monday and Friday afternoons, as you may experience shorter wait times. The office is located at [address], [address], [place], [place] [address] No appointment is necessary.   Labs are drawn by [facility]. Please bring your co-pay at the time of your lab draw.  You may receive a bill from [facility] for your lab work.  Please note if you are on Hydroxychloroquine and and an order has been placed for a Hydroxychloroquine level, you will need to have it drawn 4 hours or more after your last dose.  If you wish to have your labs drawn at another location, please call the office 24 hours in advance to send orders.  If you have any questions regarding directions or hours of operation,  please call [phone].   As a reminder, please drink plenty of water prior to coming for your lab work. Thanks!   Vaccines You are taking a medication(s) that can suppress your immune system.  The following immunizations are recommended: Flu annually Covid-19  Td/Tdap (tetanus, diphtheria,  pertussis) every 10 years Pneumonia (Prevnar 15 then Pneumovax 23 at least 1 year apart.  Alternatively, can take Prevnar 20 without needing additional dose) Shingrix: 2 doses from 4 weeks to 6 months apart  Please check with your PCP to make sure you are up to date.  

## 2021-12-13 ENCOUNTER — Other Ambulatory Visit: Payer: Self-pay | Admitting: Physician Assistant

## 2021-12-13 DIAGNOSIS — Z79899 Other long term (current) drug therapy: Secondary | ICD-10-CM

## 2021-12-14 NOTE — Telephone Encounter (Signed)
Next Visit: 02/24/2022  Last Visit: 09/24/2021  Last Fill: 09/11/2021  DX: Rheumatoid arthritis involving multiple sites with positive rheumatoid factor   Current Dose per office note 09/24/2021: Imuran 75 mg by mouth daily  Labs: 09/18/2021 Mild anemia noted. CMP is stable.  Patient advised she is due to update labs. Patient states she will be in this week to update.   Okay to refill Imuran?

## 2021-12-21 ENCOUNTER — Other Ambulatory Visit: Payer: Self-pay

## 2021-12-21 DIAGNOSIS — Z79899 Other long term (current) drug therapy: Secondary | ICD-10-CM

## 2021-12-21 LAB — COMPLETE METABOLIC PANEL WITH GFR
AG Ratio: 1.1 (calc) (ref 1.0–2.5)
ALT: 7 U/L (ref 6–29)
AST: 24 U/L (ref 10–35)
Albumin: 4.2 g/dL (ref 3.6–5.1)
Alkaline phosphatase (APISO): 60 U/L (ref 31–125)
BUN: 10 mg/dL (ref 7–25)
CO2: 31 mmol/L (ref 20–32)
Calcium: 9.8 mg/dL (ref 8.6–10.2)
Chloride: 106 mmol/L (ref 98–110)
Creat: 0.71 mg/dL (ref 0.50–0.99)
Globulin: 3.9 g/dL (calc) — ABNORMAL HIGH (ref 1.9–3.7)
Glucose, Bld: 94 mg/dL (ref 65–99)
Potassium: 4.3 mmol/L (ref 3.5–5.3)
Sodium: 142 mmol/L (ref 135–146)
Total Bilirubin: 0.4 mg/dL (ref 0.2–1.2)
Total Protein: 8.1 g/dL (ref 6.1–8.1)
eGFR: 105 mL/min/{1.73_m2} (ref >=60)

## 2021-12-21 LAB — CBC WITH DIFFERENTIAL/PLATELET
Absolute Monocytes: 303 cells/uL (ref 200–950)
Basophils Absolute: 19 cells/uL (ref 0–200)
Basophils Relative: 0.5 %
Eosinophils Absolute: 270 cells/uL (ref 15–500)
Eosinophils Relative: 7.3 %
HCT: 34.8 % — ABNORMAL LOW (ref 35.0–45.0)
Hemoglobin: 11.4 g/dL — ABNORMAL LOW (ref 11.7–15.5)
Lymphs Abs: 1610 cells/uL (ref 850–3900)
MCH: 29.1 pg (ref 27.0–33.0)
MCHC: 32.8 g/dL (ref 32.0–36.0)
MCV: 88.8 fL (ref 80.0–100.0)
MPV: 10.8 fL (ref 7.5–12.5)
Monocytes Relative: 8.2 %
Neutro Abs: 1499 cells/uL — ABNORMAL LOW (ref 1500–7800)
Neutrophils Relative %: 40.5 %
Platelets: 293 10*3/uL (ref 140–400)
RBC: 3.92 10*6/uL (ref 3.80–5.10)
RDW: 13.5 % (ref 11.0–15.0)
Total Lymphocyte: 43.5 %
WBC: 3.7 10*3/uL — ABNORMAL LOW (ref 3.8–10.8)

## 2021-12-21 NOTE — Progress Notes (Signed)
Globulin is borderline elevated. Rest of CMP WNL.  We will continue to monitor.

## 2021-12-22 ENCOUNTER — Encounter: Payer: Self-pay | Admitting: *Deleted

## 2021-12-22 ENCOUNTER — Telehealth: Payer: Self-pay | Admitting: *Deleted

## 2021-12-22 DIAGNOSIS — Z79899 Other long term (current) drug therapy: Secondary | ICD-10-CM

## 2021-12-22 NOTE — Telephone Encounter (Signed)
-----   Message from Ofilia Neas, PA-C sent at 12/22/2021  8:38 AM EDT ----- WBC count is borderline low-3.7 but trending down. absolute neutrophils are borderline low. hgb and hct are low but stable.  Recheck CBC with diff in 1 month.

## 2021-12-22 NOTE — Progress Notes (Signed)
WBC count is borderline low-3.7 but trending down. absolute neutrophils are borderline low. hgb and hct are low but stable.  Recheck CBC with diff in 1 month.

## 2022-01-08 ENCOUNTER — Other Ambulatory Visit: Payer: Self-pay | Admitting: Physician Assistant

## 2022-01-08 NOTE — Telephone Encounter (Addendum)
Next Visit: 02/24/2022   Last Visit: 09/24/2021   Last Fill: 12/14/2021 (30 day supply)   DX: Rheumatoid arthritis involving multiple sites with positive rheumatoid factor    Current Dose per office note 09/24/2021: Imuran 75 mg by mouth daily   Labs: 12/21/2021 Globulin is borderline elevated. Rest of CMP WNL. WBC count is borderline low-3.7 but trending down. absolute neutrophils are borderline low. hgb and hct are low but stable.    Okay to refill Imuran?

## 2022-01-11 ENCOUNTER — Encounter: Payer: Self-pay | Admitting: Rheumatology

## 2022-01-11 NOTE — Telephone Encounter (Signed)
I would recommend an evaluation by her PCP.   It is unlikely that this is a medication side effect of imuran.

## 2022-01-13 ENCOUNTER — Ambulatory Visit: Payer: 59 | Admitting: Family Medicine

## 2022-01-20 ENCOUNTER — Ambulatory Visit (INDEPENDENT_AMBULATORY_CARE_PROVIDER_SITE_OTHER): Payer: 59 | Admitting: Family Medicine

## 2022-01-20 VITALS — BP 138/96 | HR 76 | Temp 98.5°F | Ht 62.0 in | Wt 149.8 lb

## 2022-01-20 DIAGNOSIS — M0579 Rheumatoid arthritis with rheumatoid factor of multiple sites without organ or systems involvement: Secondary | ICD-10-CM | POA: Diagnosis not present

## 2022-01-20 DIAGNOSIS — J849 Interstitial pulmonary disease, unspecified: Secondary | ICD-10-CM | POA: Diagnosis not present

## 2022-01-20 DIAGNOSIS — R030 Elevated blood-pressure reading, without diagnosis of hypertension: Secondary | ICD-10-CM

## 2022-01-20 DIAGNOSIS — E039 Hypothyroidism, unspecified: Secondary | ICD-10-CM | POA: Diagnosis not present

## 2022-01-20 DIAGNOSIS — G43909 Migraine, unspecified, not intractable, without status migrainosus: Secondary | ICD-10-CM

## 2022-01-20 DIAGNOSIS — Z7689 Persons encountering health services in other specified circumstances: Secondary | ICD-10-CM

## 2022-01-20 DIAGNOSIS — K219 Gastro-esophageal reflux disease without esophagitis: Secondary | ICD-10-CM

## 2022-01-20 NOTE — Progress Notes (Signed)
Patient presents to clinic today to establish care and follow-up on ongoing concerns.  SUBJECTIVE: PMH: Pt is a 46 yo female previously seen by Maurice Small, MD.  RA: -Diagnosed the end of 2019 -Seen by rheumatology, Dr. Estanislado Pandy -Methotrexate caused cough -On Imuran. -Pt sister also with RA  ILD: -Followed by pulmonology -Endorses scarring in lungs, unsure of cause by methotrexate -Using prednisone times several months caused HTN -If patient laughs it leads to coughing  GERD: -Prilosec daily -Using wedge pillow at night  Thyroid dysfunction: -dx'd postpartum, 14 yrs ago after birth of 2nd child. -previously on syntrhoid 50 mcg.  Stable off meds -pt's sis has thyroid   Migraines: -increased sodium and pork -pain in R side of head the last few wks -frontal HAs -may drink 1 cup of coffee -taking Excedrin prn -feels rested in am  Food and environmental allergies: -fruit caused eye edema in HA -did allergy allergy shots, now able to eat food -Strawberries, ragweed, cats-sneezing  Elevated blood pressure: -Not currently on medication for HTN  Past surgical history: -C-section April 2009 -Tubal ligation 2010  Social history: Patient is married.  She currently works as a Writer.  Patient has 2 children.  Patient denies alcohol, tobacco, drug use.  Health Maintenance: Dental --[facility] Immunizations --influenza vaccine, TB test 2023 Colonoscopy --2023.  Followed by [facility] gastroenterology, Dr. Therisa Doyne Mammogram --November 2022 PAP --November 2022.  History of abnormal Pap in 2012. Bone Density --   Family medical history: Mom, dec-arthritis, HTN, CVA Dad, dec-prostate ca s/p XRT, COPD MGM, dec-stomach ca  Past Medical History:  Diagnosis Date   Abnormal Pap smear    Dysplasia of cervix, low grade (CIN 1)    H/O: pneumonia    Hypothyroidism    Rheumatoid arthritis ([place])     Past Surgical History:  Procedure Laterality  Date   CESAREAN SECTION     LAPAROSCOPIC TUBAL LIGATION  02/01/2011   Procedure: LAPAROSCOPIC TUBAL LIGATION;  Surgeon: Delice Lesch, MD;  Location: [facility];  Service: Gynecology;  Laterality: Bilateral;   LEEP      Current Outpatient Medications on File Prior to Visit  Medication Sig Dispense Refill   aspirin-acetaminophen-caffeine (EXCEDRIN MIGRAINE) 250-250-65 MG tablet Take by mouth as needed for headache.     azaTHIOprine (IMURAN) 50 MG tablet TAKE 1 AND 1/2 TABLETS BY MOUTH DAILY 135 tablet 0   levothyroxine (SYNTHROID, LEVOTHROID) 50 MCG tablet Take 50 mcg by mouth daily.     loratadine (CLARITIN) 10 MG tablet TAKE 1 TABLET BY MOUTH EVERY DAY 90 tablet 1   Multiple Vitamin (MULTIVITAMIN WITH MINERALS) TABS tablet Take 1 tablet by mouth daily.     omeprazole (PRILOSEC) 20 MG capsule Take 20 mg by mouth daily.     Turmeric 1053 MG TABS daily.     Calcium-Vitamin D-Vitamin K (VIACTIV CALCIUM PLUS D) 650-12.5-40 MG-MCG-MCG CHEW daily. (Patient not taking: Reported on 09/24/2021)     No current facility-administered medications on file prior to visit.    Allergies  Allergen Reactions   Other     Cats Fruit     Family History  Problem Relation Age of Onset   Hypertension Mother    Diabetes Mother    Stroke Mother    Hypertension Father    Rheum arthritis Sister    Hypertension Brother    Healthy Son    Healthy Daughter    Rheum arthritis Maternal Aunt    Rheum  arthritis Maternal Aunt     Social History   Socioeconomic History   Marital status: Married    Spouse name: Not on file   Number of children: Not on file   Years of education: Not on file   Highest education level: Not on file  Occupational History   Not on file  Tobacco Use   Smoking status: Never    Passive exposure: Past   Smokeless tobacco: Never  Vaping Use   Vaping Use: Never used  Substance and Sexual Activity   Alcohol use: No   Drug use: No   Sexual activity: Yes    Birth  control/protection: Surgical    Comment: BTL  Other Topics Concern   Not on file  Social History Narrative   Not on file   Social Determinants of Health   Financial Resource Strain: Not on file  Food Insecurity: Not on file  Transportation Needs: Not on file  Physical Activity: Not on file  Stress: Not on file  Social Connections: Not on file  Intimate Partner Violence: Not on file    ROS General: Denies fever, chills, night sweats, changes in weight, changes in appetite HEENT: Denies ear pain, changes in vision, rhinorrhea, sore throat +migraines CV: Denies CP, palpitations, SOB, orthopnea Pulm: Denies SOB, cough, wheezing GI: Denies abdominal pain, nausea, vomiting, diarrhea, constipation GU: Denies dysuria, hematuria, frequency, vaginal discharge Msk: Denies muscle cramps, joint pains Neuro: Denies weakness, numbness, tingling Skin: Denies rashes, bruising Psych: Denies depression, anxiety, hallucinations   BP (!) 146/100 (BP Location: Left Arm, Patient Position: Sitting, Cuff Size: Normal)   Pulse 76   Temp 98.5 F (36.9 C) (Oral)   Ht _0  (1.575 m)   Wt 149 lb 12.8 oz (67.9 kg)   LMP 08/26/2013   SpO2 96%   BMI 27.40 kg/m   Physical Exam Gen. Pleasant, well developed, well-nourished, in NAD HEENT - [place]/AT, PERRL, EOMI, conjunctive clear, no scleral icterus, no nasal drainage Lungs: no use of accessory muscles, CTAB, no wheezes, rales or rhonchi Cardiovascular: RRR, No r/g/m, no peripheral edema Abdomen: BS present, soft, nontender, nondistended Musculoskeletal: No deformities, moves all four extremities, no cyanosis or clubbing, normal tone Neuro:  A&Ox3, CN II-XII intact, normal gait Skin:  Warm, dry, intact, no lesions  Recent Results (from the past 2160 hour(s))  COMPLETE METABOLIC PANEL WITH GFR     Status: Abnormal   Collection Time: 12/21/21 11:31 AM  Result Value Ref Range   Glucose, Bld 94 65 - 99 mg/dL    Comment: .            Fasting reference  interval .    BUN 10 7 - 25 mg/dL   Creat 0.71 0.50 - 0.99 mg/dL   eGFR 105 > OR = 60 mL/min/1.52m   BUN/Creatinine Ratio SEE NOTE: 6 - 22 (calc)    Comment:    Not Reported: BUN and Creatinine are within    reference range. .    Sodium 142 135 - 146 mmol/L   Potassium 4.3 3.5 - 5.3 mmol/L   Chloride 106 98 - 110 mmol/L   CO2 31 20 - 32 mmol/L   Calcium 9.8 8.6 - 10.2 mg/dL   Total Protein 8.1 6.1 - 8.1 g/dL   Albumin 4.2 3.6 - 5.1 g/dL   Globulin 3.9 (H) 1.9 - 3.7 g/dL (calc)   AG Ratio 1.1 1.0 - 2.5 (calc)   Total Bilirubin 0.4 0.2 - 1.2 mg/dL   Alkaline phosphatase (  APISO) 60 31 - 125 U/L   AST 24 10 - 35 U/L   ALT 7 6 - 29 U/L  CBC with Differential/Platelet     Status: Abnormal   Collection Time: 12/21/21 11:31 AM  Result Value Ref Range   WBC 3.7 (L) 3.8 - 10.8 Thousand/uL   RBC 3.92 3.80 - 5.10 Million/uL   Hemoglobin 11.4 (L) 11.7 - 15.5 g/dL   HCT 34.8 (L) 35.0 - 45.0 %   MCV 88.8 80.0 - 100.0 fL   MCH 29.1 27.0 - 33.0 pg   MCHC 32.8 32.0 - 36.0 g/dL   RDW 13.5 11.0 - 15.0 %   Platelets 293 140 - 400 Thousand/uL   MPV 10.8 7.5 - 12.5 fL   Neutro Abs 1,499 (L) 1,500 - 7,800 cells/uL   Lymphs Abs 1,610 850 - 3,900 cells/uL   Absolute Monocytes 303 200 - 950 cells/uL   Eosinophils Absolute 270 15 - 500 cells/uL   Basophils Absolute 19 0 - 200 cells/uL   Neutrophils Relative % 40.5 %   Total Lymphocyte 43.5 %   Monocytes Relative 8.2 %   Eosinophils Relative 7.3 %   Basophils Relative 0.5 %    Assessment/Plan: Elevated blood pressure reading in office without diagnosis of hypertension -recheck bp -Discussed lifestyle modifications -prolonged prednisone use may have caused. -Patient to obtain BP cuff for home use.  For elevations consistently greater than 140/90 start medication  ILD (interstitial lung disease) ([place]) -stable -caused by MTX use? -Continue current medications -Continue follow-up with pulmonology  Acquired hypothyroidism -currently  stable off medication -Noted postpartum -Continue to monitor  Rheumatoid arthritis involving multiple sites with positive rheumatoid factor (HCC) -stable -MTX caused cough and possibly ILD -continue imuran  -continue f/u with Rheumatology  Gastroesophageal reflux disease without esophagitis -controlled  -continue prilosec daily -fam hx of gastric cancer in MGM -continue f/u with GI  Migraine without status migrainosus, not intractable, unspecified migraine type -discussed migraine prevention -continue hydration, rest, OTC excedrin. Use NSAIDs sparingly -for continued or  worsened symptoms f/u with Neurology.  Encounter to establish care -We reviewed the PMH, PSH, FH, SH, Meds and Allergies. -We provided refills for any medications we will prescribe as needed. -We addressed current concerns per orders and patient instructions. -We have asked for records for pertinent exams, studies, vaccines and notes from previous providers. -We have advised patient to follow up per instructions below.  F/u in 1 month for elevated bp  Grier Mitts, MD

## 2022-01-20 NOTE — Patient Instructions (Signed)
Goal for blood pressure is less than 140/90.  If you notice number consistently greater than this please notify clinic.

## 2022-01-27 ENCOUNTER — Other Ambulatory Visit: Payer: Self-pay | Admitting: *Deleted

## 2022-01-27 DIAGNOSIS — Z79899 Other long term (current) drug therapy: Secondary | ICD-10-CM

## 2022-01-27 LAB — CBC WITH DIFFERENTIAL/PLATELET
Absolute Monocytes: 400 cells/uL (ref 200–950)
Basophils Absolute: 19 cells/uL (ref 0–200)
Basophils Relative: 0.4 %
Eosinophils Absolute: 118 cells/uL (ref 15–500)
Eosinophils Relative: 2.5 %
HCT: 36.9 % (ref 35.0–45.0)
Hemoglobin: 11.8 g/dL (ref 11.7–15.5)
Lymphs Abs: 1429 cells/uL (ref 850–3900)
MCH: 27.6 pg (ref 27.0–33.0)
MCHC: 32 g/dL (ref 32.0–36.0)
MCV: 86.4 fL (ref 80.0–100.0)
MPV: 11 fL (ref 7.5–12.5)
Monocytes Relative: 8.5 %
Neutro Abs: 2735 cells/uL (ref 1500–7800)
Neutrophils Relative %: 58.2 %
Platelets: 268 10*3/uL (ref 140–400)
RBC: 4.27 10*6/uL (ref 3.80–5.10)
RDW: 13.3 % (ref 11.0–15.0)
Total Lymphocyte: 30.4 %
WBC: 4.7 10*3/uL (ref 3.8–10.8)

## 2022-01-28 NOTE — Progress Notes (Signed)
CBC is normal.

## 2022-01-30 ENCOUNTER — Telehealth: Payer: Self-pay | Admitting: Student

## 2022-01-30 MED ORDER — PAXLOVID (300/100) 20 X 150 MG & 10 X 100MG PO TBPK: ORAL_TABLET | ORAL | 0 refills | Status: DC

## 2022-01-30 NOTE — Telephone Encounter (Signed)
She has had covid-19 symptoms since Wednesday morning. Tested positive today. She is vaccinated for covid-19. No dyspnea. Prescribed paxlovid. Counseled to hold imuran at least while she's on that (not due to interaction but just as she's getting over covid-19 infection). If dyspneic she'll give [place] a call or if O2 saturation <88%.  Laroy Apple Pulmonary/Critical Care

## 2022-02-07 ENCOUNTER — Encounter: Payer: Self-pay | Admitting: Family Medicine

## 2022-02-09 LAB — HM MAMMOGRAPHY

## 2022-02-09 LAB — HM PAP SMEAR

## 2022-02-12 NOTE — Progress Notes (Unsigned)
Office Visit Note  Patient: [name]             Date of Birth: [birth year]           MRN: [id number]             PCP: Billie Ruddy, MD Referring: Maurice Small, MD Visit Date: 02/24/2022 Occupation: _0 @  Subjective:  Medication monitoring  History of Present Illness: [name] is a 46 y.o. female with history of seropositive rheumatoid arthritis and sjogren's syndrome.  She is currently taking Imuran 75 mg by mouth daily.  She continues to tolerate imuran without any side effects.  Patient reports that she was diagnosed with COVID-19 in mid November 2023.  She states that she was prescribed Paxlovid at which time she held Imuran until the infection is completely resolved and she completed the course of Paxlovid.  She denies any other recurrent infections.  She denies missing any other doses of Imuran recently.  She denies any signs or symptoms of a rheumatoid arthritis flare.  She denies any sicca symptoms.  She denies any swollen lymph nodes.  She continues to see the dentist every 6 months as advised.  She denies any new or worsening pulmonary symptoms.  Her last appointment with Dr. Chase Caller was in February 2023.  She does not yet have a scheduled follow-up visit.  She denies any recent symptoms of Raynaud's phenomenon. Patient reports that she was recently started on amlodipine for elevated blood pressure.  She has been tolerating amlodipine and her blood pressure has started to improve.  She has also been working on weight loss.   Activities of Daily Living:  Patient reports morning stiffness for 0 minutes.   Patient Denies nocturnal pain.  Difficulty dressing/grooming: Denies Difficulty climbing stairs: Denies Difficulty getting out of chair: Denies Difficulty using hands for taps, buttons, cutlery, and/or writing: Denies  Review of Systems  Constitutional:  Negative for fatigue.  HENT:  Negative for mouth sores and mouth dryness.   Eyes:  Negative  for dryness.  Respiratory:  Negative for shortness of breath.   Cardiovascular:  Negative for chest pain and palpitations.  Gastrointestinal:  Negative for blood in stool, constipation and diarrhea.  Endocrine: Negative for increased urination.  Genitourinary:  Negative for involuntary urination.  Musculoskeletal:  Negative for joint pain, gait problem, joint pain, joint swelling, myalgias, muscle weakness, morning stiffness, muscle tenderness and myalgias.  Skin:  Negative for color change, rash, hair loss and sensitivity to sunlight.  Allergic/Immunologic: Negative for susceptible to infections.  Neurological:  Negative for dizziness and headaches.  Hematological:  Negative for swollen glands.  Psychiatric/Behavioral:  Negative for depressed mood and sleep disturbance. The patient is not nervous/anxious.     PMFS History:  Patient Active Problem List   Diagnosis Date Noted   Abnormal cervical Papanicolaou smear 07/08/2020   Chronic fatigue syndrome 07/08/2020   Cyst of ovary 07/08/2020   Encounter for therapeutic drug level monitoring 07/08/2020   Hypothyroidism 07/08/2020   Irregular periods 07/08/2020   Menopause 07/08/2020   Mixed hyperlipidemia 07/08/2020   Oligomenorrhea 07/08/2020   Pain in pelvis 07/08/2020   Stenosis of cervix 07/08/2020   Vaginal high risk human papillomavirus (HPV) DNA test positive 07/08/2020   Vitamin D deficiency 07/08/2020   ILD (interstitial lung disease) ([place]) 02/20/2019   Restrictive lung disease 10/09/2018   Chronic cough 09/07/2018   RA (rheumatoid arthritis) ([place]) 09/07/2018   H/O: pneumonia    Dysplasia of cervix, low  grade (CIN 1 and 2)     Past Medical History:  Diagnosis Date   Abnormal Pap smear    Dysplasia of cervix, low grade (CIN 1)    H/O: pneumonia    Hypothyroidism    Rheumatoid arthritis ([place])     Family History  Problem Relation Age of Onset   Hypertension Mother    Diabetes Mother    Stroke Mother    Hypertension  Father    Rheum arthritis Sister    Hypertension Brother    Healthy Son    Healthy Daughter    Rheum arthritis Maternal Aunt    Rheum arthritis Maternal Aunt    Past Surgical History:  Procedure Laterality Date   CESAREAN SECTION     LAPAROSCOPIC TUBAL LIGATION  02/01/2011   Procedure: LAPAROSCOPIC TUBAL LIGATION;  Surgeon: Delice Lesch, MD;  Location: [facility];  Service: Gynecology;  Laterality: Bilateral;   LEEP     Social History   Social History Narrative   Not on file   Immunization History  Administered Date(s) Administered   Influenza Split 12/19/2012, 03/05/2014, 11/24/2015, 11/28/2017, 12/28/2019, 12/31/2020   Influenza,inj,Quad PF,6+ Mos 12/29/2018   Influenza,inj,quad, With Preservative 01/10/2015   Influenza-Unspecified 12/29/2021   PFIZER(Purple Top)SARS-COV-2 Vaccination 05/25/2019, 06/15/2019, 02/01/2020   Td 08/14/2003, 07/02/2015   Tdap 03/05/2014     Objective: Vital Signs: BP 118/85 (BP Location: Left Arm, Patient Position: Sitting, Cuff Size: Normal)   Pulse 83   Resp 13   Ht _0  (1.549 m)   Wt 146 lb 6.4 oz (66.4 kg)   LMP 08/26/2013   BMI 27.66 kg/m    Physical Exam Vitals and nursing note reviewed.  Constitutional:      Appearance: She is well-developed.  HENT:     Head: Normocephalic and atraumatic.  Eyes:     Conjunctiva/sclera: Conjunctivae normal.  Cardiovascular:     Rate and Rhythm: Normal rate and regular rhythm.     Heart sounds: Normal heart sounds.  Pulmonary:     Effort: Pulmonary effort is normal.     Breath sounds: Normal breath sounds.  Abdominal:     General: Bowel sounds are normal.     Palpations: Abdomen is soft.  Musculoskeletal:     Cervical back: Normal range of motion.  Skin:    General: Skin is warm and dry.     Capillary Refill: Capillary refill takes less than 2 seconds.  Neurological:     Mental Status: She is alert and oriented to person, place, and time.  Psychiatric:        Behavior: Behavior  normal.      Musculoskeletal Exam: C-spine, thoracic spine, and lumbar spine good ROM.  No midline spinal tenderness or SI joint tenderness.  Shoulder joints, elbow joints, wrist joints, MCPs, PIPs, and DIPs good ROM with no synovitis.  Complete fist formation bilaterally.  Hip joints have good ROM with no groin pain.  Knee joints have good ROM with no warmth or effusion.  Ankle joints have good ROM with no tenderness or joint swelling.   CDAI Exam: CDAI Score: -- Patient Global: 1 mm; Provider Global: 1 mm Swollen: --; Tender: -- Joint Exam 02/24/2022   No joint exam has been documented for this visit   There is currently no information documented on the homunculus. Go to the Rheumatology activity and complete the homunculus joint exam.  Investigation: No additional findings.  Imaging: No results found.  Recent Labs: Lab Results  Component Value Date  WBC 4.7 01/27/2022   HGB 11.8 01/27/2022   PLT 268 01/27/2022   NA 142 12/21/2021   K 4.3 12/21/2021   CL 106 12/21/2021   CO2 31 12/21/2021   GLUCOSE 94 12/21/2021   BUN 10 12/21/2021   CREATININE 0.71 12/21/2021   BILITOT 0.4 12/21/2021   ALKPHOS 92 07/07/2007   AST 24 12/21/2021   ALT 7 12/21/2021   PROT 8.1 12/21/2021   ALBUMIN 1.8 (L) 07/07/2007   CALCIUM 9.8 12/21/2021   GFRAA 122 06/12/2020   QFTBGOLDPLUS NEGATIVE 03/07/2019    Speciality Comments: Treated with methotrexate (December 2019-February 2021)which was stopped due to shortness of breath. Imuran and started in February 2021  Procedures:  No procedures performed Allergies: Other   Assessment / Plan:     Visit Diagnoses: Rheumatoid arthritis involving multiple sites with positive rheumatoid factor (HCC) - Positive RF, +_0 eta and elevated ESR.  Diagnosed in 2019. MTX(12/19-02/21)-dcd after shortness of breath: She has no synovitis on examination today.  She has not had any signs or symptoms of a rheumatoid arthritis flare.  She is clinically  doing well taking Imuran 75 mg daily.  She is tolerating Imuran without any side effects and has not missed any doses recently.  She has not had any morning stiffness or nocturnal pain.  No difficulty with ADLs.  She will remain on Imuran as monotherapy.  She was advised to notify us if she develops signs or symptoms of a flare.  She will follow-up in the office in 5 months or sooner if needed.  High risk medication use - Imuran 75 mg by mouth daily. (started in February 2021) Previous therapy: MTX 4 tablets by mouth once weekly and folic acid 1 mg po qd (d/c January 2021). CBC WNL on 01/27/22. CMP drawn on 12/21/21. Her next lab work will be due in January and every 3 months. Orders for CBC and CMP remain in place.  She held Imuran while taking Paxlovid in mid November 2023.  Discussed the importance of holding imuran if she develops signs or symptoms of an infection and to resume once the infection has completely cleared.   Sjogren's syndrome with other organ involvement ([place]) - ANA 1: 1280 speckled, positive Ro antibody.  She is not experiencing any sicca symptoms.  She has no cervical lymphadenopathy.  Discussed the importance of seeing her dentist every 6 months.   Raynaud's syndrome without gangrene: Not currently active.  No digital ulcers or signs of gangrene.   ILD (interstitial lung disease) ([place]) - Followed by Dr. Chase Caller.  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  PFTs updated on 05/07/21. Reviewed office visit note from 05/07/2021 -ILD stable based on symptom score, walking desaturation test, and stable DLCO.  Recommended continuing on Imuran as prescribed.  Not currently taking any antifibrotic agents.  Advised patient to follow-up with Dr. Chase Caller to have repeat PFT testing.   She is not currently experiencing any new or worsening pulmonary symptoms.  Other medical condition are listed as follows:   History of hyperlipidemia  Thyroid  goiter  Gastroesophageal reflux disease without esophagitis  Vitamin D deficiency  History of hypothyroidism  Dysplasia of cervix, low grade (CIN 1 and 2) - followed by GYN.  Orders: No orders of the defined types were placed in this encounter.  No orders of the defined types were placed in this encounter.    Follow-Up Instructions: Return in about 5 months (around 07/26/2022) for Rheumatoid arthritis,  Sjogren's syndrome.   Ofilia Neas, PA-C  Note - This record has been created using Dragon software.  Chart creation errors have been sought, but may not always  have been located. Such creation errors do not reflect on  the standard of medical care.

## 2022-02-17 ENCOUNTER — Ambulatory Visit (INDEPENDENT_AMBULATORY_CARE_PROVIDER_SITE_OTHER): Payer: 59 | Admitting: Family Medicine

## 2022-02-17 ENCOUNTER — Encounter: Payer: Self-pay | Admitting: Family Medicine

## 2022-02-17 VITALS — BP 156/97 | HR 71 | Temp 98.3°F | Wt 146.6 lb

## 2022-02-17 DIAGNOSIS — I10 Essential (primary) hypertension: Secondary | ICD-10-CM

## 2022-02-17 MED ORDER — AMLODIPINE BESYLATE 2.5 MG PO TABS: 2.5000 mg | ORAL_TABLET | Freq: Every day | ORAL | 1 refills | Status: DC

## 2022-02-17 NOTE — Progress Notes (Signed)
Subjective:    Patient ID: [name], female    DOB: [birth year], 46 y.o.   MRN: [id number]  Chief Complaint  Patient presents with   Follow-up   Hypertension    HPI Patient was seen today for follow-up on elevated BP without diagnosis of HTN.  Patient monitoring BP at home readings 130/83, 136/86, 134/90, 135/97, 137/91, 123/80, 130/89, 136/85, 130/81, 131/85, 136/80, 124/77, 132/86, 132/84.  Today in clinic BP 154/100, with patient's BP cuff 156/97.  Patient denies headaches, CP, changes in vision, LE edema.  Patient walking for exercise 3-4 times per week and monitoring sodium intake.  States BP was elevated at 160s systolic at OB/GYN appointment.  Patient also notes gynecologist heard murmur and was planning to send patient to cardiologist.  Patient states her father was on Norvasc.  Patient started on Paxlovid 01/30/2022 for COVID infection by pulmonologist.  Pt taking Imuran for RA.  Past Medical History:  Diagnosis Date   Abnormal Pap smear    Dysplasia of cervix, low grade (CIN 1)    H/O: pneumonia    Hypothyroidism    Rheumatoid arthritis (HCC)     Allergies  Allergen Reactions   Other     Cats Fruit     ROS General: Denies fever, chills, night sweats, changes in weight, changes in appetite HEENT: Denies headaches, ear pain, changes in vision, rhinorrhea, sore throat CV: Denies CP, palpitations, SOB, orthopnea Pulm: Denies SOB, cough, wheezing GI: Denies abdominal pain, nausea, vomiting, diarrhea, constipation GU: Denies dysuria, hematuria, frequency, vaginal discharge Msk: Denies muscle cramps, joint pains Neuro: Denies weakness, numbness, tingling Skin: Denies rashes, bruising Psych: Denies depression, anxiety, hallucinations     Objective:    Blood pressure (!) 156/97, pulse 71, temperature 98.3 F (36.8 C), temperature source Oral, weight 146 lb 9.6 oz (66.5 kg), last menstrual period 08/26/2013, SpO2 98 %.  Gen. Pleasant, well-nourished, in no  distress, normal affect   HEENT: [place]/AT, face symmetric, conjunctiva clear, no scleral icterus, PERRLA, EOMI, nares patent without drainage Lungs: no accessory muscle use, CTAB, no wheezes or rales Cardiovascular: RRR, no m/r/g, no peripheral edema Musculoskeletal: No deformities, no cyanosis or clubbing, normal tone Neuro:  A&Ox3, CN II-XII intact, normal gait Skin:  Warm, no lesions/ rash   Wt Readings from Last 3 Encounters:  02/17/22 146 lb 9.6 oz (66.5 kg)  01/20/22 149 lb 12.8 oz (67.9 kg)  09/24/21 149 lb 9.6 oz (67.9 kg)    Lab Results  Component Value Date   WBC 4.7 01/27/2022   HGB 11.8 01/27/2022   HCT 36.9 01/27/2022   PLT 268 01/27/2022   GLUCOSE 94 12/21/2021   ALT 7 12/21/2021   AST 24 12/21/2021   NA 142 12/21/2021   K 4.3 12/21/2021   CL 106 12/21/2021   CREATININE 0.71 12/21/2021   BUN 10 12/21/2021   CO2 31 12/21/2021   TSH 1.965 01/12/2012    Assessment/Plan:  Essential hypertension  -Uncontrolled in clinic.  Readings at home better controlled 130s/80s to 90s -Given diastolic BPs at home greater than 80 discussed r/b/a of starting low-dose BP medication. -Patient to continue lifestyle modifications -Will start Norvasc 2.5 mg daily -Continue monitoring BP at home and keep a log of readings to bring to clinic appointment - Plan: amLODipine (NORVASC) 2.5 MG tablet  F/u 4-6 weeks, sooner if needed  Abbe Amsterdam, MD

## 2022-02-17 NOTE — Patient Instructions (Addendum)
A prescription for Norvasc (amlodipine) 2.5 mg was sent to your pharmacy.  You can take 1 pill daily for your blood pressure.  Continue to monitor your blood pressure at home and keep a log of the readings.  We will have you follow-up in clinic in the next 4-6 weeks, sooner if needed to recheck your blood pressure and see how things are going on the new medicine.

## 2022-02-24 ENCOUNTER — Encounter: Payer: Self-pay | Admitting: Physician Assistant

## 2022-02-24 ENCOUNTER — Ambulatory Visit: Payer: 59 | Attending: Physician Assistant | Admitting: Physician Assistant

## 2022-02-24 ENCOUNTER — Encounter: Payer: Self-pay | Admitting: Family Medicine

## 2022-02-24 ENCOUNTER — Ambulatory Visit (INDEPENDENT_AMBULATORY_CARE_PROVIDER_SITE_OTHER): Payer: 59 | Admitting: Family Medicine

## 2022-02-24 VITALS — BP 130/90 | HR 70 | Temp 98.1°F | Ht 61.5 in | Wt 144.0 lb

## 2022-02-24 VITALS — BP 118/85 | HR 83 | Resp 13 | Ht 61.0 in | Wt 146.4 lb

## 2022-02-24 DIAGNOSIS — E039 Hypothyroidism, unspecified: Secondary | ICD-10-CM

## 2022-02-24 DIAGNOSIS — Z0000 Encounter for general adult medical examination without abnormal findings: Secondary | ICD-10-CM

## 2022-02-24 DIAGNOSIS — Z79899 Other long term (current) drug therapy: Secondary | ICD-10-CM

## 2022-02-24 DIAGNOSIS — K219 Gastro-esophageal reflux disease without esophagitis: Secondary | ICD-10-CM

## 2022-02-24 DIAGNOSIS — M3509 Sicca syndrome with other organ involvement: Secondary | ICD-10-CM

## 2022-02-24 DIAGNOSIS — E782 Mixed hyperlipidemia: Secondary | ICD-10-CM

## 2022-02-24 DIAGNOSIS — I7300 Raynaud's syndrome without gangrene: Secondary | ICD-10-CM | POA: Diagnosis not present

## 2022-02-24 DIAGNOSIS — Z8639 Personal history of other endocrine, nutritional and metabolic disease: Secondary | ICD-10-CM

## 2022-02-24 DIAGNOSIS — I10 Essential (primary) hypertension: Secondary | ICD-10-CM | POA: Diagnosis not present

## 2022-02-24 DIAGNOSIS — M0579 Rheumatoid arthritis with rheumatoid factor of multiple sites without organ or systems involvement: Secondary | ICD-10-CM | POA: Diagnosis not present

## 2022-02-24 DIAGNOSIS — E559 Vitamin D deficiency, unspecified: Secondary | ICD-10-CM

## 2022-02-24 DIAGNOSIS — J849 Interstitial pulmonary disease, unspecified: Secondary | ICD-10-CM

## 2022-02-24 DIAGNOSIS — E049 Nontoxic goiter, unspecified: Secondary | ICD-10-CM

## 2022-02-24 DIAGNOSIS — N870 Mild cervical dysplasia: Secondary | ICD-10-CM

## 2022-02-24 LAB — LIPID PANEL
Cholesterol: 184 mg/dL (ref 0–200)
HDL: 60.2 mg/dL (ref >39.00)
LDL Cholesterol: 114 mg/dL — ABNORMAL HIGH (ref 0–99)
NonHDL: 123.81
Total CHOL/HDL Ratio: 3
Triglycerides: 51 mg/dL (ref 0.0–149.0)
VLDL: 10.2 mg/dL (ref 0.0–40.0)

## 2022-02-24 LAB — TSH: TSH: 1.6 u[IU]/mL (ref 0.35–5.50)

## 2022-02-24 MED ORDER — LEVOTHYROXINE SODIUM 50 MCG PO TABS: 50.0000 ug | ORAL_TABLET | Freq: Every day | ORAL | 3 refills | Status: DC

## 2022-02-24 NOTE — Progress Notes (Signed)
Subjective:     [name] is a 46 y.o. female and is here for a comprehensive physical exam. The patient reports no problems.  Pt started on Norvasc 2.5 mg daily at last OFV on 02/17/22.  BP at home 120s/80s.  Pt bought a treadmill and plans to start using it regularly.  Wants to lose 8 lbs.  Had appt with Dr. Cherly Hensen, OB/Gyn this month.   Has f/u with Rheum scheduled.  Social History   Socioeconomic History   Marital status: Married    Spouse name: Not on file   Number of children: Not on file   Years of education: Not on file   Highest education level: Not on file  Occupational History   Not on file  Tobacco Use   Smoking status: Never    Passive exposure: Past   Smokeless tobacco: Never  Vaping Use   Vaping Use: Never used  Substance and Sexual Activity   Alcohol use: No   Drug use: No   Sexual activity: Yes    Birth control/protection: Surgical    Comment: BTL  Other Topics Concern   Not on file  Social History Narrative   Not on file   Social Determinants of Health   Financial Resource Strain: Not on file  Food Insecurity: Not on file  Transportation Needs: Not on file  Physical Activity: Not on file  Stress: Not on file  Social Connections: Not on file  Intimate Partner Violence: Not on file   Health Maintenance  Topic Date Due   COVID-19 Vaccine (4 - 2023-24 season) 03/12/2022 (Originally 11/13/2021)   PAP SMEAR-Modifier  02/10/2025   DTaP/Tdap/Td (4 - Td or Tdap) 07/01/2025   COLONOSCOPY (Pts 45-35yrs Insurance coverage will need to be confirmed)  10/09/2031   INFLUENZA VACCINE  Completed   Hepatitis C Screening  Completed   HIV Screening  Completed   HPV VACCINES  Aged Out    The following portions of the patient's history were reviewed and updated as appropriate: allergies, current medications, past family history, past medical history, past social history, past surgical history, and problem list.  Review of Systems Pertinent items noted in  HPI and remainder of comprehensive ROS otherwise negative.   Objective:    BP (!) 130/90 (BP Location: Right Arm, Cuff Size: Normal)   Pulse 70   Temp 98.1 F (36.7 C) (Oral)   Ht 5' 1.5" (1.562 m)   Wt 144 lb (65.3 kg)   LMP 08/26/2013   SpO2 99%   BMI 26.77 kg/m  General appearance: alert, cooperative, and no distress Head: Normocephalic, without obvious abnormality, atraumatic Eyes: conjunctivae/corneas clear. PERRL, EOM's intact. Fundi benign. Ears: normal TM's and external ear canals both ears Nose: Nares normal. Septum midline. Mucosa normal. No drainage or sinus tenderness. Throat: lips, mucosa, and tongue normal; teeth and gums normal Neck: no adenopathy, no carotid bruit, no JVD, supple, symmetrical, trachea midline, and thyroid not enlarged, symmetric, no tenderness/mass/nodules Lungs: clear to auscultation bilaterally Heart: regular rate and rhythm, S1, S2 normal, no murmur, click, rub or gallop Abdomen: soft, non-tender; bowel sounds normal; no masses,  no organomegaly Extremities: extremities normal, atraumatic, no cyanosis or edema Pulses: 2+ and symmetric Skin: Skin color, texture, turgor normal. No rashes or lesions Lymph nodes: Cervical, supraclavicular, and axillary nodes normal. Neurologic: Alert and oriented X 3, normal strength and tone. Normal symmetric reflexes. Normal coordination and gait    Assessment:    Healthy female exam.  Plan:    Anticipatory guidance given including wearing seatbelts, smoke detectors in the home, increasing physical activity, increasing p.o. intake of water and vegetables. -labs.  Labs from October and November 2023 reviewed -immunizations reviewed. -colonoscopy done 10/08/21 -mammogram done 02/03/21 -pap up to date -next CPE in 1 yr See After Visit Summary for Counseling Recommendations   Mixed hyperlipidemia -LDL 123, HDL 63, total cholesterol 196, Trig 54 on 02/19/21 -lifestyle modifications  - Plan: Lipid  panel  Acquired hypothyroidism -Continue Synthroid 50 mcg daily  - Plan: levothyroxine (SYNTHROID) 50 MCG tablet, TSH  Essential hypertension -Improving -BP readings at home well-controlled -Mild elevation in clinic.  Recheck improved. -Continue Norvasc 2.5 mg -Continue lifestyle modifications  F/u in 3-4 months for HTN.  Grier Mitts, MD

## 2022-02-24 NOTE — Patient Instructions (Addendum)
Standing Labs We placed an order today for your standing lab work.   Please have your standing labs drawn in January and every 3 months   Please have your labs drawn 2 weeks prior to your appointment so that the provider can discuss your lab results at your appointment.  Please note that you may see your imaging and lab results in MyChart before we have reviewed them. We will contact you once all results are reviewed. Please allow our office up to 72 hours to thoroughly review all of the results before contacting the office for clarification of your results.  Lab hours are:   Monday through Thursday from 8:00 am -12:30 pm and 1:00 pm-5:00 pm and Friday from 8:00 am-12:00 pm.  Please be advised, all patients with office appointments requiring lab work will take precedent over walk-in lab work.   Labs are drawn by [facility]. Please bring your co-pay at the time of your lab draw.  You may receive a bill from [facility] for your lab work.  Please note if you are on Hydroxychloroquine and and an order has been placed for a Hydroxychloroquine level, you will need to have it drawn 4 hours or more after your last dose.  If you wish to have your labs drawn at another location, please call the office 24 hours in advance so we can fax the orders.  The office is located at [address], [address], [place], [place] [address] No appointment is necessary.    If you have any questions regarding directions or hours of operation,  please call [phone].   As a reminder, please drink plenty of water prior to coming for your lab work. Thanks!  If you have signs or symptoms of an infection or start antibiotics: First, call your PCP for workup of your infection. Hold your medication through the infection, until you complete your antibiotics, and until symptoms resolve if you take the following: Injectable medication (Actemra, Benlysta, Cimzia, Cosentyx, Enbrel, Humira, Kevzara, Orencia, Remicade, Simponi,  Stelara, Taltz, Tremfya) Methotrexate Leflunomide (Arava) Mycophenolate (Cellcept) Xeljanz, Olumiant, or Rinvoq   Vaccines You are taking a medication(s) that can suppress your immune system.  The following immunizations are recommended: Flu annually Covid-19  Td/Tdap (tetanus, diphtheria, pertussis) every 10 years Pneumonia (Prevnar 15 then Pneumovax 23 at least 1 year apart.  Alternatively, can take Prevnar 20 without needing additional dose) Shingrix: 2 doses from 4 weeks to 6 months apart  Please check with your PCP to make sure you are up to date.   

## 2022-03-02 ENCOUNTER — Encounter: Payer: Self-pay | Admitting: Family Medicine

## 2022-03-30 ENCOUNTER — Encounter: Payer: Self-pay | Admitting: Family Medicine

## 2022-04-14 ENCOUNTER — Other Ambulatory Visit: Payer: Self-pay

## 2022-04-14 DIAGNOSIS — Z79899 Other long term (current) drug therapy: Secondary | ICD-10-CM

## 2022-04-14 NOTE — Progress Notes (Signed)
CBC WNL

## 2022-04-15 LAB — COMPLETE METABOLIC PANEL WITH GFR
AG Ratio: 1.2 (calc) (ref 1.0–2.5)
ALT: 7 U/L (ref 6–29)
AST: 24 U/L (ref 10–35)
Albumin: 4.8 g/dL (ref 3.6–5.1)
Alkaline phosphatase (APISO): 56 U/L (ref 31–125)
BUN: 10 mg/dL (ref 7–25)
CO2: 29 mmol/L (ref 20–32)
Calcium: 10.8 mg/dL — ABNORMAL HIGH (ref 8.6–10.2)
Chloride: 102 mmol/L (ref 98–110)
Creat: 0.79 mg/dL (ref 0.50–0.99)
Globulin: 4.1 g/dL (calc) — ABNORMAL HIGH (ref 1.9–3.7)
Glucose, Bld: 93 mg/dL (ref 65–99)
Potassium: 4.3 mmol/L (ref 3.5–5.3)
Sodium: 139 mmol/L (ref 135–146)
Total Bilirubin: 0.6 mg/dL (ref 0.2–1.2)
Total Protein: 8.9 g/dL — ABNORMAL HIGH (ref 6.1–8.1)
eGFR: 92 mL/min/{1.73_m2} (ref >=60)

## 2022-04-15 LAB — CBC WITH DIFFERENTIAL/PLATELET
Absolute Monocytes: 475 cells/uL (ref 200–950)
Basophils Absolute: 29 cells/uL (ref 0–200)
Basophils Relative: 0.6 %
Eosinophils Absolute: 191 cells/uL (ref 15–500)
Eosinophils Relative: 3.9 %
HCT: 36.5 % (ref 35.0–45.0)
Hemoglobin: 12.1 g/dL (ref 11.7–15.5)
Lymphs Abs: 1793 cells/uL (ref 850–3900)
MCH: 28.5 pg (ref 27.0–33.0)
MCHC: 33.2 g/dL (ref 32.0–36.0)
MCV: 85.9 fL (ref 80.0–100.0)
MPV: 11.1 fL (ref 7.5–12.5)
Monocytes Relative: 9.7 %
Neutro Abs: 2411 cells/uL (ref 1500–7800)
Neutrophils Relative %: 49.2 %
Platelets: 311 10*3/uL (ref 140–400)
RBC: 4.25 10*6/uL (ref 3.80–5.10)
RDW: 13.9 % (ref 11.0–15.0)
Total Lymphocyte: 36.6 %
WBC: 4.9 10*3/uL (ref 3.8–10.8)

## 2022-04-15 NOTE — Progress Notes (Signed)
Calcium is elevated-10.8.  avoid taking a calcium and vitamin D supplement at this time.

## 2022-04-19 ENCOUNTER — Other Ambulatory Visit (HOSPITAL_COMMUNITY): Payer: Self-pay | Admitting: Cardiology

## 2022-04-19 DIAGNOSIS — Z8249 Family history of ischemic heart disease and other diseases of the circulatory system: Secondary | ICD-10-CM

## 2022-04-22 ENCOUNTER — Ambulatory Visit (HOSPITAL_BASED_OUTPATIENT_CLINIC_OR_DEPARTMENT_OTHER)
Admission: RE | Admit: 2022-04-22 | Discharge: 2022-04-22 | Disposition: A | Discharge status: Home / Self Care | Payer: 59 | Source: Ambulatory Visit | Attending: Cardiology | Admitting: Cardiology

## 2022-04-22 DIAGNOSIS — Z8249 Family history of ischemic heart disease and other diseases of the circulatory system: Secondary | ICD-10-CM | POA: Insufficient documentation

## 2022-05-07 ENCOUNTER — Other Ambulatory Visit: Payer: Self-pay | Admitting: Physician Assistant

## 2022-05-07 NOTE — Telephone Encounter (Signed)
Next Visit: 07/28/2022  Last Visit: 02/24/2022  Last Fill: 01/08/2022  DX: Rheumatoid arthritis involving multiple sites with positive rheumatoid factor   Current Dose per office note 02/24/2022: Imuran 75 mg by mouth daily   Labs: 04/14/2022 CBC WNL Calcium is elevated-10.8.  avoid taking a calcium and vitamin D supplement at this time.     Okay to refill Imuran?

## 2022-05-13 ENCOUNTER — Ambulatory Visit (INDEPENDENT_AMBULATORY_CARE_PROVIDER_SITE_OTHER): Payer: 59 | Admitting: Family

## 2022-05-13 ENCOUNTER — Encounter (HOSPITAL_BASED_OUTPATIENT_CLINIC_OR_DEPARTMENT_OTHER): Payer: Self-pay | Admitting: Family

## 2022-05-13 VITALS — BP 130/85 | HR 61 | Ht 61.0 in | Wt 146.0 lb

## 2022-05-13 DIAGNOSIS — R011 Cardiac murmur, unspecified: Secondary | ICD-10-CM

## 2022-05-13 DIAGNOSIS — I10 Essential (primary) hypertension: Secondary | ICD-10-CM

## 2022-05-13 NOTE — Patient Instructions (Addendum)
Medication Instructions:  Your physician recommends that you continue on your current medications as directed. Please refer to the Current Medication list given to you today.  Labwork: Please return for Lab work in 1-2 weeks for Renin-Aldosterone, Metanephrines, Catecholamines, and Cortisol. You may come to the...   [facility] (3rd floor) [address] [place], [place] [address]  Open: 8am-Noon and 1pm-4:30pm  Please ring the doorbell on the small table when you exit the elevator and the Lab Tech will come get you  [place] [facility] [address] [place], [place], [place] [address] Open: 8am-1pm, then 2pm-4:30pm   [place]- Please see attached locations sheet stapled to your lab work with address and hours.    Testing/Procedures: Your physician has requested that you have an echocardiogram. Echocardiography is a painless test that uses sound waves to create images of your heart. It provides your doctor with information about the size and shape of your heart and how well your heart's chambers and valves are working. This procedure takes approximately one hour. There are no restrictions for this procedure. Please do NOT wear cologne, perfume, aftershave, or lotions (deodorant is allowed). Please arrive 15 minutes prior to your appointment time.   Your physician has requested that you have a renal artery duplex. During this test, an ultrasound is used to evaluate blood flow to the kidneys. Allow one hour for this exam. Do not eat after midnight the day before and avoid carbonated beverages. Take your medications as you usually do.  Follow-Up: 07/13/3022 11:30 am with DR [facility]   Special Instructions:  DASH Eating Plan DASH stands for Dietary Approaches to Stop Hypertension. The DASH eating plan is a healthy eating plan that has been shown to: Reduce high blood pressure (hypertension). Reduce your risk for type 2 diabetes, heart disease,  and stroke. Help with weight loss. What are tips for following this plan? Reading food labels Check food labels for the amount of salt (sodium) per serving. Choose foods with less than 5 percent of the Daily Value of sodium. Generally, foods with less than 300 milligrams (mg) of sodium per serving fit into this eating plan. To find whole grains, look for the word "whole" as the first word in the ingredient list. Shopping Buy products labeled as "low-sodium" or "no salt added." Buy fresh foods. Avoid canned foods and pre-made or frozen meals. Cooking Avoid adding salt when cooking. Use salt-free seasonings or herbs instead of table salt or sea salt. Check with your health care provider or pharmacist before using salt substitutes. Do not fry foods. Cook foods using healthy methods such as baking, boiling, grilling, roasting, and broiling instead. Cook with heart-healthy oils, such as olive, canola, avocado, soybean, or sunflower oil. Meal planning  Eat a balanced diet that includes: 4 or more servings of fruits and 4 or more servings of vegetables each day. Try to fill one-half of your plate with fruits and vegetables. 6-8 servings of whole grains each day. Less than 6 oz (170 g) of lean meat, poultry, or fish each day. A 3-oz (85-g) serving of meat is about the same size as a deck of cards. One egg equals 1 oz (28 g). 2-3 servings of low-fat dairy each day. One serving is 1 cup (237 mL). 1 serving of nuts, seeds, or beans 5 times each week. 2-3 servings of heart-healthy fats. Healthy fats called omega-3 fatty acids are found in foods such as walnuts, flaxseeds, fortified milks, and eggs. These  fats are also found in cold-water fish, such as sardines, salmon, and mackerel. Limit how much you eat of: Canned or prepackaged foods. Food that is high in trans fat, such as some fried foods. Food that is high in saturated fat, such as fatty meat. Desserts and other sweets, sugary drinks, and other  foods with added sugar. Full-fat dairy products. Do not salt foods before eating. Do not eat more than 4 egg yolks a week. Try to eat at least 2 vegetarian meals a week. Eat more home-cooked food and less restaurant, buffet, and fast food. Lifestyle When eating at a restaurant, ask that your food be prepared with less salt or no salt, if possible. If you drink alcohol: Limit how much you use to: 0-1 drink a day for women who are not pregnant. 0-2 drinks a day for men. Be aware of how much alcohol is in your drink. In the [place] one drink equals one 12 oz bottle of beer (355 mL), one 5 oz glass of wine (148 mL), or one 1 oz glass of hard liquor (44 mL). General information Avoid eating more than 2,300 mg of salt a day. If you have hypertension, you may need to reduce your sodium intake to 1,500 mg a day. Work with your health care provider to maintain a healthy body weight or to lose weight. Ask what an ideal weight is for you. Get at least 30 minutes of exercise that causes your heart to beat faster (aerobic exercise) most days of the week. Activities may include walking, swimming, or biking. Work with your health care provider or dietitian to adjust your eating plan to your individual calorie needs. What foods should I eat? Fruits All fresh, dried, or frozen fruit. Canned fruit in natural juice (without added sugar). Vegetables Fresh or frozen vegetables (raw, steamed, roasted, or grilled). Low-sodium or reduced-sodium tomato and vegetable juice. Low-sodium or reduced-sodium tomato sauce and tomato paste. Low-sodium or reduced-sodium canned vegetables. Grains Whole-grain or whole-wheat bread. Whole-grain or whole-wheat pasta. Brown rice. [place]. Bulgur. Whole-grain and low-sodium cereals. Pita bread. Low-fat, low-sodium crackers. Whole-wheat flour tortillas. Meats and other proteins Skinless chicken or [place]. Ground chicken or [place]. Pork with fat trimmed off. Fish and seafood.  Egg whites. Dried beans, peas, or lentils. Unsalted nuts, nut butters, and seeds. Unsalted canned beans. Lean cuts of beef with fat trimmed off. Low-sodium, lean precooked or cured meat, such as sausages or meat loaves. Dairy Low-fat (1%) or fat-free (skim) milk. Reduced-fat, low-fat, or fat-free cheeses. Nonfat, low-sodium ricotta or cottage cheese. Low-fat or nonfat yogurt. Low-fat, low-sodium cheese. Fats and oils Soft margarine without trans fats. Vegetable oil. Reduced-fat, low-fat, or light mayonnaise and salad dressings (reduced-sodium). Canola, safflower, olive, avocado, soybean, and sunflower oils. Avocado. Seasonings and condiments Herbs. Spices. Seasoning mixes without salt. Other foods Unsalted popcorn and pretzels. Fat-free sweets. The items listed above may not be a complete list of foods and beverages you can eat. Contact a dietitian for more information. What foods should I avoid? Fruits Canned fruit in a light or heavy syrup. Fried fruit. Fruit in cream or butter sauce. Vegetables Creamed or fried vegetables. Vegetables in a cheese sauce. Regular canned vegetables (not low-sodium or reduced-sodium). Regular canned tomato sauce and paste (not low-sodium or reduced-sodium). Regular tomato and vegetable juice (not low-sodium or reduced-sodium). Angie Fava. Olives. Grains Baked goods made with fat, such as croissants, muffins, or some breads. Dry pasta or rice meal packs. Meats and other proteins Fatty cuts of meat. Ribs.  Fried meat. Berniece Salines. Bologna, salami, and other precooked or cured meats, such as sausages or meat loaves. Fat from the back of a pig (fatback). Bratwurst. Salted nuts and seeds. Canned beans with added salt. Canned or smoked fish. Whole eggs or egg yolks. Chicken or [place] with skin. Dairy Whole or 2% milk, cream, and half-and-half. Whole or full-fat cream cheese. Whole-fat or sweetened yogurt. Full-fat cheese. Nondairy creamers. Whipped toppings. Processed cheese and  cheese spreads. Fats and oils Butter. Stick margarine. Lard. Shortening. Ghee. Bacon fat. Tropical oils, such as coconut, palm kernel, or palm oil. Seasonings and condiments Onion salt, garlic salt, seasoned salt, table salt, and sea salt. Worcestershire sauce. Tartar sauce. Barbecue sauce. Teriyaki sauce. Soy sauce, including reduced-sodium. Steak sauce. Canned and packaged gravies. Fish sauce. Oyster sauce. Cocktail sauce. Store-bought horseradish. Ketchup. Mustard. Meat flavorings and tenderizers. Bouillon cubes. Hot sauces. Pre-made or packaged marinades. Pre-made or packaged taco seasonings. Relishes. Regular salad dressings. Other foods Salted popcorn and pretzels. The items listed above may not be a complete list of foods and beverages you should avoid. Contact a dietitian for more information. Where to find more information National Heart, Lung, and Blood Institute: https://wilson-eaton.com/ American Heart Association: www.heart.org Academy of Nutrition and Dietetics: www.eatright.[place]: www.kidney.org Summary The DASH eating plan is a healthy eating plan that has been shown to reduce high blood pressure (hypertension). It may also reduce your risk for type 2 diabetes, heart disease, and stroke. When on the DASH eating plan, aim to eat more fresh fruits and vegetables, whole grains, lean proteins, low-fat dairy, and heart-healthy fats. With the DASH eating plan, you should limit salt (sodium) intake to 2,300 mg a day. If you have hypertension, you may need to reduce your sodium intake to 1,500 mg a day. Work with your health care provider or dietitian to adjust your eating plan to your individual calorie needs. This information is not intended to replace advice given to you by your health care provider. Make sure you discuss any questions you have with your health care provider. Document Revised: 02/02/2019 Document Reviewed: 02/02/2019 Elsevier Patient Education  [place].

## 2022-05-13 NOTE — Progress Notes (Signed)
[facility] Initial Assessment:    Date:  05/13/2022   ID:  Ross Stores, DOB 17-Nov-1975, MRN [id number]  PCP:  Billie Ruddy, MD  Cardiologist:  None  Nephrologist:  Referring MD: Servando Salina, MD   CC: Hypertension  History of Present Illness:    [name] is a 47 y.o. female with a hx of RA, hypothyroidism, HTN here to establish care in the [facility].   [name] was diagnosed with hypertension early 2020 in the setting of taking prednisone.  She had to take for a few months as a result of her RA but elevated blood pressure persisted despite discontinuation of prednisone. It has been difficult to control with much improvement on amlodipine 2.5 mg daily. Blood pressure checked with arm cuff at home and has been found to be accurate. Readings have been 120s/70s-80s.  she reports tobacco use never. Alcohol use 1-2 glasses of wine 1-2 times per year. For exercise she walks and uses elliptical 2-3 times per week for 20-30 minutes. Her goal is to get to give days per week. she eats at home and does follow low sodium diet.  She is hopeful that if she is able to to lose about 10 pounds that her blood pressure will decrease and she will milligram a medication.  She is interested in pursuing secondary workup.  Reports no shortness of breath nor dyspnea on exertion. Reports no chest pain, pressure, or tightness. No edema, orthopnea, PND. Reports no palpitations.    Previous antihypertensives:   Past Medical History:  Diagnosis Date   Abnormal Pap smear    Dysplasia of cervix, low grade (CIN 1)    H/O: pneumonia    Hypothyroidism    Rheumatoid arthritis ([place])     Past Surgical History:  Procedure Laterality Date   CESAREAN SECTION     LAPAROSCOPIC TUBAL LIGATION  02/01/2011   Procedure: LAPAROSCOPIC TUBAL LIGATION;  Surgeon: Delice Lesch, MD;  Location: [facility];  Service: Gynecology;  Laterality: Bilateral;    LEEP      Current Medications: Current Meds  Medication Sig   amLODipine (NORVASC) 2.5 MG tablet Take 1 tablet (2.5 mg total) by mouth daily.   aspirin-acetaminophen-caffeine (EXCEDRIN MIGRAINE) 250-250-65 MG tablet Take by mouth as needed for headache.   azaTHIOprine (IMURAN) 50 MG tablet TAKE 1 AND 1/2 TABLETS DAILY BY MOUTH   levothyroxine (SYNTHROID) 50 MCG tablet Take 1 tablet (50 mcg total) by mouth daily.   loratadine (CLARITIN) 10 MG tablet TAKE 1 TABLET BY MOUTH EVERY DAY   Multiple Vitamin (MULTIVITAMIN WITH MINERALS) TABS tablet Take 1 tablet by mouth daily.   Turmeric 1053 MG TABS daily.     Allergies:   Other   Social History   Socioeconomic History   Marital status: Married    Spouse name: Not on file   Number of children: Not on file   Years of education: Not on file   Highest education level: Not on file  Occupational History   Not on file  Tobacco Use   Smoking status: Never    Passive exposure: Past   Smokeless tobacco: Never  Vaping Use   Vaping Use: Never used  Substance and Sexual Activity   Alcohol use: No   Drug use: No   Sexual activity: Yes    Birth control/protection: Surgical    Comment: BTL  Other Topics Concern   Not on file  Social History Narrative   Not  on file   Social Determinants of Health   Financial Resource Strain: Not on file  Food Insecurity: No Food Insecurity (05/13/2022)   Hunger Vital Sign    Worried About Running Out of Food in the Last Year: Never true    Ran Out of Food in the Last Year: Never true  Transportation Needs: No Transportation Needs (05/13/2022)   PRAPARE - Hydrologist (Medical): No    Lack of Transportation (Non-Medical): No  Physical Activity: Insufficiently Active (05/13/2022)   Exercise Vital Sign    Days of Exercise per Week: 3 days    Minutes of Exercise per Session: 30 min  Stress: Not on file  Social Connections: Not on file    Family History: The patient's  family history includes Diabetes in her mother; Healthy in her daughter and son; Heart attack (age of onset: 28 - 5) in her father; Hypertension in her brother, father, and mother; Rheum arthritis in her maternal aunt, maternal aunt, and sister; Stroke in her mother.  ROS:   Please see the history of present illness.     All other systems reviewed and are negative.  EKGs/Labs/Other Studies Reviewed:    EKG:  EKG is ordered today.  The ekg ordered today demonstrates normal sinus rhythm 61 bpm with no acute ST/T wave changes.   Recent Labs: 02/24/2022: TSH 1.60 04/14/2022: ALT 7; BUN 10; Creat 0.79; Hemoglobin 12.1; Platelets 311; Potassium 4.3; Sodium 139   Recent Lipid Panel    Component Value Date/Time   CHOL 184 02/24/2022 1158   TRIG 51.0 02/24/2022 1158   HDL 60.20 02/24/2022 1158   CHOLHDL 3 02/24/2022 1158   VLDL 10.2 02/24/2022 1158   LDLCALC 114 (H) 02/24/2022 1158    Physical Exam:   VS:  BP 130/85 Comment: right arm  Pulse 61   Ht '5\' 1"'$  (1.549 m)   Wt 146 lb (66.2 kg)   LMP 08/26/2013   BMI 27.59 kg/m  , BMI Body mass index is 27.59 kg/m. GENERAL:  Well appearing HEENT: Pupils equal round and reactive, fundi not visualized, oral mucosa unremarkable NECK:  No jugular venous distention, waveform within normal limits, carotid upstroke brisk and symmetric, no bruits, no thyromegaly LYMPHATICS:  No cervical adenopathy LUNGS:  Clear to auscultation bilaterally HEART:  RRR.  PMI not displaced or sustained,S1 and S2 within normal limits, no S3, no S4, no clicks, no rubs, no murmurs ABD:  Flat, positive bowel sounds normal in frequency in pitch, no bruits, no rebound, no guarding, no midline pulsatile mass, no hepatomegaly, no splenomegaly EXT:  2 plus pulses throughout, no edema, no cyanosis no clubbing SKIN:  No rashes no nodules NEURO:  Cranial nerves II through XII grossly intact, motor grossly intact throughout PSYCH:  Cognitively intact, oriented to person place and  time   ASSESSMENT/PLAN:    HTN -Home BP at goal of less than 130/80 when checked with arm cuff which has previously been checked for accuracy.  Continue amlodipine 2.5 mg daily.  Will proceed with secondary workup including renal artery duplex and labs (urine-aldosterone, metanephrines, catecholamines, cortisol).  BP symmetrical-no indication for carotid Dopplers.  No sort of breathing and no indication for sleep study.  She is working to be more active and lose weight-we discussed that if home BP routinely less than 120 could trial off amlodipine.  Murmur -not appreciated on exam today but recently noted by her OB/GYN.  Will plan for echocardiogram to rule out significant  valvular abnormality.  Hypothyroidism- managed by PCP.  RA - Follows with rheumatology.    Screening for Secondary Hypertension:     05/13/2022    1:27 PM  Causes  Drugs/Herbals Screened     - Comments No excessive caffeine nor OTC meds  Renovascular HTN Screened  Sleep Apnea N/A     - Comments no daytime somnolence nor sleep disordered breathing  Thyroid Disease Screened     - Comments hypothyroidism managed by PCP  Hyperaldosteronism Screened  Pheochromocytoma Screened  Cushing's Syndrome Screened  Coarctation of the Aorta N/A     - Comments BP symmetrical  Compliance Screened     - Comments compliant with mediation regimen    Relevant Labs/Studies:    Latest Ref Rng & Units 04/14/2022   11:41 AM 12/21/2021   11:31 AM 09/18/2021   11:50 AM  Basic Labs  Sodium 135 - 146 mmol/L 139  142  140   Potassium 3.5 - 5.3 mmol/L 4.3  4.3  3.7   Creatinine 0.50 - 0.99 mg/dL 0.79  0.71  0.76        Latest Ref Rng & Units 02/24/2022   11:58 AM 01/12/2012    4:37 PM  Thyroid   TSH 0.35 - 5.50 uIU/mL 1.60  1.965                 05/13/2022   12:17 PM  Renovascular   Renal Artery [place] Completed Yes      Disposition:    FU with MD/PharmD in 2 months    Medication Adjustments/Labs and Tests  Ordered: Current medicines are reviewed at length with the patient today.  Concerns regarding medicines are outlined above.  Orders Placed This Encounter  Procedures   Metanephrines, plasma   Catecholamines, fractionated, plasma   Aldosterone + renin activity w/ ratio   Cortisol   EKG 12-Lead   ECHOCARDIOGRAM COMPLETE   VAS US RENAL ARTERY DUPLEX   No orders of the defined types were placed in this encounter.    Signed, Loel Dubonnet, NP  05/13/2022 1:28 PM    [facility]

## 2022-06-02 LAB — METANEPHRINES, PLASMA
Metanephrine, Free: <25 pg/mL (ref 0.0–88.0)
Normetanephrine, Free: 45.6 pg/mL (ref 0.0–218.9)

## 2022-06-02 LAB — ALDOSTERONE + RENIN ACTIVITY W/ RATIO
Aldos/Renin Ratio: 20.4 (ref 0.0–30.0)
Aldosterone: 7 ng/dL (ref 0.0–30.0)
Renin Activity, Plasma: 0.343 ng/mL/hr (ref 0.167–5.380)

## 2022-06-02 LAB — CATECHOLAMINES, FRACTIONATED, PLASMA
Dopamine: <30 pg/mL (ref 0–48)
Epinephrine: 33 pg/mL (ref 0–62)
Norepinephrine: 288 pg/mL (ref 0–874)

## 2022-06-02 LAB — CORTISOL: Cortisol: 9.2 ug/dL (ref 6.2–19.4)

## 2022-06-04 ENCOUNTER — Ambulatory Visit (INDEPENDENT_AMBULATORY_CARE_PROVIDER_SITE_OTHER): Payer: 59

## 2022-06-04 DIAGNOSIS — R011 Cardiac murmur, unspecified: Secondary | ICD-10-CM

## 2022-06-04 DIAGNOSIS — I10 Essential (primary) hypertension: Secondary | ICD-10-CM

## 2022-06-04 LAB — ECHOCARDIOGRAM COMPLETE
AR max vel: 2.16 cm2
AV Area VTI: 1.95 cm2
AV Area mean vel: 2.24 cm2
AV Mean grad: 3 mmHg
AV Peak grad: 5.2 mmHg
Ao pk vel: 1.14 m/s
Area-P 1/2: 3.77 cm2
Calc EF: 63.6 %
S' Lateral: 2.75 cm
Single Plane A2C EF: 62.8 %
Single Plane A4C EF: 65.7 %

## 2022-06-25 ENCOUNTER — Ambulatory Visit (INDEPENDENT_AMBULATORY_CARE_PROVIDER_SITE_OTHER): Payer: 59 | Admitting: Family Medicine

## 2022-06-25 ENCOUNTER — Encounter: Payer: Self-pay | Admitting: Family Medicine

## 2022-06-25 VITALS — BP 130/84 | HR 70 | Temp 98.5°F | Ht 61.0 in | Wt 146.2 lb

## 2022-06-25 DIAGNOSIS — I10 Essential (primary) hypertension: Secondary | ICD-10-CM

## 2022-06-25 DIAGNOSIS — M0579 Rheumatoid arthritis with rheumatoid factor of multiple sites without organ or systems involvement: Secondary | ICD-10-CM

## 2022-06-25 DIAGNOSIS — R300 Dysuria: Secondary | ICD-10-CM | POA: Diagnosis not present

## 2022-06-25 LAB — POCT URINALYSIS DIPSTICK
Bilirubin, UA: NEGATIVE
Blood, UA: NEGATIVE
Glucose, UA: NEGATIVE
Ketones, UA: NEGATIVE
Nitrite, UA: NEGATIVE
Protein, UA: NEGATIVE
Spec Grav, UA: <=1.005 — AB (ref 1.010–1.025)
Urobilinogen, UA: 0.2 E.U./dL
pH, UA: 6.5 (ref 5.0–8.0)

## 2022-06-25 NOTE — Progress Notes (Signed)
Established Patient Office Visit   Subjective  Patient ID: [name], female    DOB: Jun 30, 1975  Age: 47 y.o. MRN: [id number]  Chief Complaint  Patient presents with   Medical Management of Chronic Issues    Patient is a 47 year old female with pmh sig for HTN, interstitial lung disease, HLD, hypothyroidism, RA who is seen for follow-up.  Patient states BP in the 1 teens-120s over 80s at home.  Has a log.  On days of BP was lowered pt exercise. Taking Norvasc 2.5 mg daily.  Denies headaches, LE edema seen by cardiology for further workup for HTN which was negative including metanephrines and catecholamines.  Patient inquires if BP medication can cause a throb in low back.  Noticed sensation a few days ago after urinating, also had slight burning sensation at the end of urination.  Denies urinary frequency, hematuria, history of renal calculi.  Has appointment in the next few months with rheumatology.  Stable on current medications including Imuran for RA.  Inquires if okay to take certain supplements such as probiotic and magnesium citrate with Norvasc.    Patient Active Problem List   Diagnosis Date Noted   Abnormal cervical Papanicolaou smear 07/08/2020   Chronic fatigue syndrome 07/08/2020   Cyst of ovary 07/08/2020   Encounter for therapeutic drug level monitoring 07/08/2020   Hypothyroidism 07/08/2020   Irregular periods 07/08/2020   Menopause 07/08/2020   Mixed hyperlipidemia 07/08/2020   Oligomenorrhea 07/08/2020   Pain in pelvis 07/08/2020   Stenosis of cervix 07/08/2020   Vaginal high risk human papillomavirus (HPV) DNA test positive 07/08/2020   Vitamin D deficiency 07/08/2020   ILD (interstitial lung disease) 02/20/2019   Restrictive lung disease 10/09/2018   Chronic cough 09/07/2018   RA (rheumatoid arthritis) 09/07/2018   H/O: pneumonia    Dysplasia of cervix, low grade (CIN 1 and 2)    Past Surgical History:  Procedure Laterality Date   CESAREAN  SECTION     LAPAROSCOPIC TUBAL LIGATION  02/01/2011   Procedure: LAPAROSCOPIC TUBAL LIGATION;  Surgeon: Purcell Nails, MD;  Location: [facility];  Service: Gynecology;  Laterality: Bilateral;   LEEP     Family History  Problem Relation Age of Onset   Hypertension Mother    Diabetes Mother    Stroke Mother    Heart attack Father 89 - 34   Hypertension Father    Rheum arthritis Sister    Hypertension Brother    Healthy Daughter    Healthy Son    Rheum arthritis Maternal Aunt    Rheum arthritis Maternal Aunt    Allergies  Allergen Reactions   Other     Cats Fruit       ROS Negative unless stated above    Objective:     BP 130/84 (BP Location: Left Arm, Patient Position: Sitting, Cuff Size: Normal)   Pulse 70   Temp 98.5 F (36.9 C) (Oral)   Ht 5\' 1"  (1.549 m)   Wt 146 lb 3.2 oz (66.3 kg)   LMP 08/26/2013   SpO2 99%   BMI 27.62 kg/m  BP Readings from Last 3 Encounters:  06/25/22 130/84  05/13/22 130/85  02/24/22 118/85   Wt Readings from Last 3 Encounters:  06/25/22 146 lb 3.2 oz (66.3 kg)  05/13/22 146 lb (66.2 kg)  02/24/22 146 lb 6.4 oz (66.4 kg)      Physical Exam Constitutional:      General: She is not in acute distress.  Appearance: Normal appearance.  HENT:     Head: Normocephalic and atraumatic.     Nose: Nose normal.     Mouth/Throat:     Mouth: Mucous membranes are moist.  Cardiovascular:     Rate and Rhythm: Normal rate and regular rhythm.     Heart sounds: Normal heart sounds. No murmur heard.    No gallop.  Pulmonary:     Effort: Pulmonary effort is normal. No respiratory distress.     Breath sounds: Normal breath sounds. No wheezing, rhonchi or rales.  Skin:    General: Skin is warm and dry.  Neurological:     Mental Status: She is alert and oriented to person, place, and time.      Results for orders placed or performed in visit on 06/25/22  POCT urinalysis dipstick  Result Value Ref Range   Color, UA Yellow    Clarity,  UA Clear    Glucose, UA Negative Negative   Bilirubin, UA Negative    Ketones, UA Negative    Spec Grav, UA <=1.005 (A) 1.010 - 1.025   Blood, UA Negative    pH, UA 6.5 5.0 - 8.0   Protein, UA Negative Negative   Urobilinogen, UA 0.2 0.2 or 1.0 E.U./dL   Nitrite, UA Negative    Leukocytes, UA Trace (A) Negative   Appearance     Odor        Assessment & Plan:  Essential hypertension  Dysuria -     POCT urinalysis dipstick -     Urine Culture  Rheumatoid arthritis involving multiple sites with positive rheumatoid factor -Stable   BP controlled.  Continue Norvasc 2.5 mg daily.  Discussed a trial off Norvasc if able to exercise consistently.  Pt will send message if decides to hold med.  Continue to monitor BP at home.  Continue follow-up with cardiology and rheumatology.  UA with trace leuks, no nitrites or RBCs.  UCX.  No follow-ups on file.   Deeann Saint, MD

## 2022-06-26 LAB — URINE CULTURE
MICRO NUMBER:: 14818236
SPECIMEN QUALITY:: ADEQUATE

## 2022-07-13 ENCOUNTER — Ambulatory Visit (HOSPITAL_BASED_OUTPATIENT_CLINIC_OR_DEPARTMENT_OTHER): Payer: 59 | Admitting: Cardiovascular Disease

## 2022-07-15 NOTE — Progress Notes (Addendum)
Office Visit Note  Patient: [name]             Date of Birth: 19-May-1975           MRN: [id number]             PCP: Deeann Saint, MD Referring: Deeann Saint, MD Visit Date: 07/28/2022 Occupation: @GUAROCC @  Subjective:  Medication management  History of Present Illness: [place] Dione Donlan is a 47 y.o. female with history of seropositive rheumatoid arthritis, Sjogren's and ILD.  She denies any history of joint pain or joint swelling.  She denies any history of increased shortness of breath.  She denies dry mouth or dry eye symptoms.  She has mild Raynaud's symptoms during the winter months.  She states she has been doing well on Imuran 75 mg p.o. daily.  She denies any interruption in the treatment.  She has an appointment coming up with Dr. Colletta Maryland next month.  She is a scheduled to have PFTs.  She had recent echocardiogram which was normal.  She is also followed by cardiologist.    Activities of Daily Living:  Patient reports morning stiffness for 0 minutes.   Patient Denies nocturnal pain.  Difficulty dressing/grooming: Denies Difficulty climbing stairs: Denies Difficulty getting out of chair: Denies Difficulty using hands for taps, buttons, cutlery, and/or writing: Denies  Review of Systems  Constitutional:  Negative for fatigue.  HENT:  Negative for mouth sores and mouth dryness.   Eyes:  Negative for dryness.  Respiratory:  Negative for shortness of breath.   Cardiovascular:  Negative for chest pain and palpitations.  Gastrointestinal:  Negative for blood in stool, constipation and diarrhea.  Endocrine: Negative for increased urination.  Genitourinary:  Negative for involuntary urination.  Musculoskeletal:  Negative for joint pain, gait problem, joint pain, joint swelling, myalgias, muscle weakness, morning stiffness, muscle tenderness and myalgias.  Skin:  Negative for color change, rash, hair loss and sensitivity to sunlight.   Allergic/Immunologic: Negative for susceptible to infections.  Neurological:  Negative for dizziness and headaches.  Hematological:  Negative for swollen glands.  Psychiatric/Behavioral:  Negative for depressed mood and sleep disturbance. The patient is not nervous/anxious.     PMFS History:  Patient Active Problem List   Diagnosis Date Noted   Abnormal cervical Papanicolaou smear 07/08/2020   Chronic fatigue syndrome 07/08/2020   Cyst of ovary 07/08/2020   Encounter for therapeutic drug level monitoring 07/08/2020   Hypothyroidism 07/08/2020   Irregular periods 07/08/2020   Menopause 07/08/2020   Mixed hyperlipidemia 07/08/2020   Oligomenorrhea 07/08/2020   Pain in pelvis 07/08/2020   Stenosis of cervix 07/08/2020   Vaginal high risk human papillomavirus (HPV) DNA test positive 07/08/2020   Vitamin D deficiency 07/08/2020   ILD (interstitial lung disease) (HCC) 02/20/2019   Restrictive lung disease 10/09/2018   Chronic cough 09/07/2018   RA (rheumatoid arthritis) (HCC) 09/07/2018   H/O: pneumonia    Dysplasia of cervix, low grade (CIN 1 and 2)     Past Medical History:  Diagnosis Date   Abnormal Pap smear    Dysplasia of cervix, low grade (CIN 1)    H/O: pneumonia    Hypothyroidism    Rheumatoid arthritis (H[facility])     Family History  Problem Relation Age of Onset   Hypertension Mother    Diabetes Mother    Stroke Mother    Heart attack Father 65 - 11   Hypertension Father    Rheum arthritis Sister  Hypertension Brother    Healthy Daughter    Healthy Son    Rheum arthritis Maternal Aunt    Rheum arthritis Maternal Aunt    Past Surgical History:  Procedure Laterality Date   CESAREAN SECTION     LAPAROSCOPIC TUBAL LIGATION  02/01/2011   Procedure: LAPAROSCOPIC TUBAL LIGATION;  Surgeon: Purcell Nails, MD;  Location: [facility];  Service: Gynecology;  Laterality: Bilateral;   LEEP     Social History   Social History Narrative   Not on file   Immunization  History  Administered Date(s) Administered   Influenza Split 12/19/2012, 03/05/2014, 11/24/2015, 11/28/2017, 12/28/2019, 12/31/2020   Influenza,inj,Quad PF,6+ Mos 12/29/2018   Influenza,inj,quad, With Preservative 01/10/2015   Influenza-Unspecified 12/29/2021   PFIZER(Purple Top)SARS-COV-2 Vaccination 05/25/2019, 06/15/2019, 02/01/2020   Td 08/14/2003, 07/02/2015   Tdap 03/05/2014     Objective: Vital Signs: BP 135/88 (BP Location: Left Arm, Patient Position: Sitting, Cuff Size: Normal)   Pulse 67   Resp 15   Ht 5\' 1"  (1.549 m)   Wt 147 lb 6.4 oz (66.9 kg)   LMP 08/26/2013   BMI 27.85 kg/m    Physical Exam Vitals and nursing note reviewed.  Constitutional:      Appearance: She is well-developed.  HENT:     Head: Normocephalic and atraumatic.  Eyes:     Conjunctiva/sclera: Conjunctivae normal.  Cardiovascular:     Rate and Rhythm: Normal rate and regular rhythm.     Heart sounds: Normal heart sounds.  Pulmonary:     Effort: Pulmonary effort is normal.     Breath sounds: Normal breath sounds.  Abdominal:     General: Bowel sounds are normal.     Palpations: Abdomen is soft.  Musculoskeletal:     Cervical back: Normal range of motion.  Lymphadenopathy:     Cervical: No cervical adenopathy.  Skin:    General: Skin is warm and dry.     Capillary Refill: Capillary refill takes less than 2 seconds.  Neurological:     Mental Status: She is alert and oriented to person, place, and time.  Psychiatric:        Behavior: Behavior normal.      Musculoskeletal Exam: Cervical, thoracic and lumbar spine were in good range of motion.  Shoulder joints, elbow joints, wrist joints, MCPs PIPs and DIPs were in good range of motion with no synovitis.  Hip joints, knee joints, ankles, MTPs and PIPs were in good range of motion with no synovitis.  CDAI Exam: CDAI Score: -- Patient Global: --; Provider Global: -- Swollen: --; Tender: -- Joint Exam 07/28/2022   No joint exam has been  documented for this visit   There is currently no information documented on the homunculus. Go to the Rheumatology activity and complete the homunculus joint exam.  Investigation: No additional findings.  Imaging: No results found.  Recent Labs: Lab Results  Component Value Date   WBC 3.2 (L) 07/28/2022   HGB 11.2 (L) 07/28/2022   PLT 289 07/28/2022   NA 140 07/28/2022   K 3.9 07/28/2022   CL 103 07/28/2022   CO2 30 07/28/2022   GLUCOSE 90 07/28/2022   BUN 10 07/28/2022   CREATININE 0.68 07/28/2022   BILITOT 0.3 07/28/2022   ALKPHOS 92 07/07/2007   AST 22 07/28/2022   ALT 6 07/28/2022   PROT 8.3 (H) 07/28/2022   PROT 8.3 (H) 07/28/2022   ALBUMIN 1.8 (L) 07/07/2007   CALCIUM 10.1 07/28/2022   GFRAA 122 06/12/2020  QFTBGOLDPLUS NEGATIVE 03/07/2019    Speciality Comments: Treated with methotrexate (December 2019-February 2021)which was stopped due to shortness of breath. Imuran and started in February 2021  Procedures:  No procedures performed Allergies: Other   Assessment / Plan:     Visit Diagnoses: Rheumatoid arthritis involving multiple sites with positive rheumatoid factor (HCC) - Positive RF, +14 3 3  eta and elevated ESR.  Diagnosed in 2019. MTX(12/19-02/21)-dcd after shortness of breath: -She had no synovitis on the examination.  She denies any history of increasing joint pain or joint swelling.  She denies any morning stiffness.  She has been tolerating  Imuran 75 mg p.o. daily without any side effects.  Plan: Rheumatoid factor  High risk medication use - Imuran 75 mg by mouth daily. (started in February 2021)Previous therapy: MTX 4 tablets by mouth once weekly and folic acid 1 mg po qd (d/c January 2021). - Labs obtained on April 14, 2022 CBC and CMP were normal except for elevated calcium of 10.8.  She was advised to to stop calcium supplement.  Will check labs today.  Plan: CBC with Differential/Platelet, COMPLETE METABOLIC PANEL WITH GFR  Sjogren's syndrome  with other organ involvement (HCC) - ANA 1: 1280 speckled, positive Ro antibody. -She denies any dry mouth or dry eye symptoms.  Increased risk of ILD and lymphoma with Sjogren's was discussed.  I will check autoimmune labs today.  Plan: Protein / creatinine ratio, urine, Anti-DNA antibody, double-stranded, C3 and C4, Sedimentation rate, Sjogrens syndrome-A extractable nuclear antibody, Serum protein electrophoresis with reflex  Raynaud's syndrome without gangrene-she has mild symptoms during winter months.  No sclerodactyly or Telengectesia were noted.  She had good capillary refill without any nailbed capillary changes.  ILD (interstitial lung disease) (HCC) - Followed by Dr. Marchelle Gearing.  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  PFTs updated on 05/07/21.  Patient will have repeat PFTs next month and an appointment with Dr. Colletta Maryland.  Recent echocardiogram was normal.  History of hyperlipidemia-dietary modified agents were discussed.  Increased risk of heart disease with autoimmune disease was also discussed.  Regular exercise was emphasized.  Vitamin D deficiency-she is not on vitamin D now.  Thyroid goiter  Gastroesophageal reflux disease without esophagitis-currently not very symptomatic and not taking any medications.  History of hypothyroidism  Dysplasia of cervix, low grade (CIN 1 and 2) - followed by GYN.  Orders: Orders Placed This Encounter  Procedures   CBC with Differential/Platelet   COMPLETE METABOLIC PANEL WITH GFR   Protein / creatinine ratio, urine   Anti-DNA antibody, double-stranded   C3 and C4   Sedimentation rate   Sjogrens syndrome-A extractable nuclear antibody   Rheumatoid factor   Serum protein electrophoresis with reflex   IFE Interpretation   No orders of the defined types were placed in this encounter.    Follow-Up Instructions: Return in about 5 months (around 12/28/2022) for Rheumatoid arthritis,  Sjogren's.   Pollyann Savoy, MD  Note - This record has been created using Animal nutritionist.  Chart creation errors have been sought, but may not always  have been located. Such creation errors do not reflect on  the standard of medical care.

## 2022-07-28 ENCOUNTER — Encounter: Payer: Self-pay | Admitting: Rheumatology

## 2022-07-28 ENCOUNTER — Ambulatory Visit: Payer: 59 | Attending: Rheumatology | Admitting: Rheumatology

## 2022-07-28 VITALS — BP 135/88 | HR 67 | Resp 15 | Ht 61.0 in | Wt 147.4 lb

## 2022-07-28 DIAGNOSIS — M0579 Rheumatoid arthritis with rheumatoid factor of multiple sites without organ or systems involvement: Secondary | ICD-10-CM | POA: Diagnosis not present

## 2022-07-28 DIAGNOSIS — I7300 Raynaud's syndrome without gangrene: Secondary | ICD-10-CM | POA: Diagnosis not present

## 2022-07-28 DIAGNOSIS — N870 Mild cervical dysplasia: Secondary | ICD-10-CM

## 2022-07-28 DIAGNOSIS — E559 Vitamin D deficiency, unspecified: Secondary | ICD-10-CM

## 2022-07-28 DIAGNOSIS — J849 Interstitial pulmonary disease, unspecified: Secondary | ICD-10-CM

## 2022-07-28 DIAGNOSIS — E049 Nontoxic goiter, unspecified: Secondary | ICD-10-CM

## 2022-07-28 DIAGNOSIS — K219 Gastro-esophageal reflux disease without esophagitis: Secondary | ICD-10-CM

## 2022-07-28 DIAGNOSIS — Z79899 Other long term (current) drug therapy: Secondary | ICD-10-CM | POA: Diagnosis not present

## 2022-07-28 DIAGNOSIS — Z8639 Personal history of other endocrine, nutritional and metabolic disease: Secondary | ICD-10-CM

## 2022-07-28 DIAGNOSIS — M3509 Sicca syndrome with other organ involvement: Secondary | ICD-10-CM

## 2022-07-28 NOTE — Patient Instructions (Addendum)
Standing Labs We placed an order today for your standing lab work.   Please have your standing labs drawn in August and every 3 months  Please have your labs drawn 2 weeks prior to your appointment so that the provider can discuss your lab results at your appointment, if possible.  Please note that you may see your imaging and lab results in MyChart before we have reviewed them. We will contact you once all results are reviewed. Please allow our office up to 72 hours to thoroughly review all of the results before contacting the office for clarification of your results.  WALK-IN LAB HOURS  Monday through Thursday from 8:00 am -12:30 pm and 1:00 pm-5:00 pm and Friday from 8:00 am-12:00 pm.  Patients with office visits requiring labs will be seen before walk-in labs.  You may encounter longer than normal wait times. Please allow additional time. Wait times may be shorter on  Monday and Thursday afternoons.  We do not book appointments for walk-in labs. We appreciate your patience and understanding with our staff.   Labs are drawn by [facility]. Please bring your co-pay at the time of your lab draw.  You may receive a bill from [facility] for your lab work.  Please note if you are on Hydroxychloroquine and and an order has been placed for a Hydroxychloroquine level,  you will need to have it drawn 4 hours or more after your last dose.  If you wish to have your labs drawn at another location, please call the office 24 hours in advance so we can fax the orders.  The office is located at [address], [place], [place] [address]   If you have any questions regarding directions or hours of operation,  please call [phone].   As a reminder, please drink plenty of water prior to coming for your lab work. Thanks!   Vaccines You are taking a medication(s) that can suppress your immune system.  The following immunizations are recommended: Flu annually Covid-19  Td/Tdap (tetanus,  diphtheria, pertussis) every 10 years Pneumonia (Prevnar 15 then Pneumovax 23 at least 1 year apart.  Alternatively, can take Prevnar 20 without needing additional dose) Shingrix: 2 doses from 4 weeks to 6 months apart  Please check with your PCP to make sure you are up to date.   Heart Disease Prevention   Your inflammatory disease increases your risk of heart disease which includes heart attack, stroke, atrial fibrillation (irregular heartbeats), high blood pressure, heart failure and atherosclerosis (plaque in the arteries).  It is important to reduce your risk by:   Keep blood pressure, cholesterol, and blood sugar at healthy levels   Smoking Cessation   Maintain a healthy weight  BMI 20-25   Eat a healthy diet  Plenty of fresh fruit, vegetables, and whole grains  Limit saturated fats, foods high in sodium, and added sugars  DASH and Mediterranean diet   Increase physical activity  Recommend moderate physically activity for 150 minutes per week/ 30 minutes a day for five days a week These can be broken up into three separate ten-minute sessions during the day.   Reduce Stress  Meditation, slow breathing exercises, yoga, coloring books  Dental visits twice a year

## 2022-07-31 ENCOUNTER — Other Ambulatory Visit: Payer: Self-pay | Admitting: Physician Assistant

## 2022-08-01 NOTE — Progress Notes (Signed)
White cell count is lower.  Hemoglobin is lower at 11.2.  Sedimentation rate is elevated at 48.  SSA antibody is positive and is stable.  Rheumatoid factor is higher at 116.  SPEP is consistent with chronic inflammatory disease.  Urine protein creatinine ratio is normal double-stranded DNA is negative complements are normal.  Patient should take Imuran 75 mg p.o. daily on a regular basis.  Please have patient repeat sedimentation rate and CBC in 1 month.

## 2022-08-02 ENCOUNTER — Other Ambulatory Visit: Payer: Self-pay | Admitting: *Deleted

## 2022-08-02 DIAGNOSIS — M3509 Sicca syndrome with other organ involvement: Secondary | ICD-10-CM

## 2022-08-02 DIAGNOSIS — J849 Interstitial pulmonary disease, unspecified: Secondary | ICD-10-CM

## 2022-08-02 DIAGNOSIS — Z8639 Personal history of other endocrine, nutritional and metabolic disease: Secondary | ICD-10-CM

## 2022-08-02 DIAGNOSIS — I7300 Raynaud's syndrome without gangrene: Secondary | ICD-10-CM

## 2022-08-02 DIAGNOSIS — Z79899 Other long term (current) drug therapy: Secondary | ICD-10-CM

## 2022-08-02 DIAGNOSIS — M0579 Rheumatoid arthritis with rheumatoid factor of multiple sites without organ or systems involvement: Secondary | ICD-10-CM

## 2022-08-02 LAB — COMPLETE METABOLIC PANEL WITH GFR
AG Ratio: 1.1 (calc) (ref 1.0–2.5)
ALT: 6 U/L (ref 6–29)
AST: 22 U/L (ref 10–35)
Albumin: 4.4 g/dL (ref 3.6–5.1)
Alkaline phosphatase (APISO): 53 U/L (ref 31–125)
BUN: 10 mg/dL (ref 7–25)
CO2: 30 mmol/L (ref 20–32)
Calcium: 10.1 mg/dL (ref 8.6–10.2)
Chloride: 103 mmol/L (ref 98–110)
Creat: 0.68 mg/dL (ref 0.50–0.99)
Globulin: 3.9 g/dL (calc) — ABNORMAL HIGH (ref 1.9–3.7)
Glucose, Bld: 90 mg/dL (ref 65–99)
Potassium: 3.9 mmol/L (ref 3.5–5.3)
Sodium: 140 mmol/L (ref 135–146)
Total Bilirubin: 0.3 mg/dL (ref 0.2–1.2)
Total Protein: 8.3 g/dL — ABNORMAL HIGH (ref 6.1–8.1)
eGFR: 107 mL/min/{1.73_m2} (ref >=60)

## 2022-08-02 LAB — CBC WITH DIFFERENTIAL/PLATELET
Absolute Monocytes: 307 cells/uL (ref 200–950)
Basophils Absolute: 10 cells/uL (ref 0–200)
Basophils Relative: 0.3 %
Eosinophils Absolute: 118 cells/uL (ref 15–500)
Eosinophils Relative: 3.7 %
HCT: 33.9 % — ABNORMAL LOW (ref 35.0–45.0)
Hemoglobin: 11.2 g/dL — ABNORMAL LOW (ref 11.7–15.5)
Lymphs Abs: 1453 cells/uL (ref 850–3900)
MCH: 28.4 pg (ref 27.0–33.0)
MCHC: 33 g/dL (ref 32.0–36.0)
MCV: 85.8 fL (ref 80.0–100.0)
MPV: 11 fL (ref 7.5–12.5)
Monocytes Relative: 9.6 %
Neutro Abs: 1312 cells/uL — ABNORMAL LOW (ref 1500–7800)
Neutrophils Relative %: 41 %
Platelets: 289 10*3/uL (ref 140–400)
RBC: 3.95 10*6/uL (ref 3.80–5.10)
RDW: 13.2 % (ref 11.0–15.0)
Total Lymphocyte: 45.4 %
WBC: 3.2 10*3/uL — ABNORMAL LOW (ref 3.8–10.8)

## 2022-08-02 LAB — PROTEIN / CREATININE RATIO, URINE
Creatinine, Urine: 63 mg/dL (ref 20–275)
Protein/Creat Ratio: 79 mg/g creat (ref 24–184)
Protein/Creatinine Ratio: 0.079 mg/mg creat (ref 0.024–0.184)
Total Protein, Urine: 5 mg/dL (ref 5–24)

## 2022-08-02 LAB — PROTEIN ELECTROPHORESIS, SERUM, WITH REFLEX
Albumin ELP: 4.5 g/dL (ref 3.8–4.8)
Alpha 1: 0.3 g/dL (ref 0.2–0.3)
Alpha 2: 0.6 g/dL (ref 0.5–0.9)
Beta 2: 0.5 g/dL (ref 0.2–0.5)
Beta Globulin: 0.5 g/dL (ref 0.4–0.6)
Gamma Globulin: 2 g/dL — ABNORMAL HIGH (ref 0.8–1.7)
Total Protein: 8.3 g/dL — ABNORMAL HIGH (ref 6.1–8.1)

## 2022-08-02 LAB — C3 AND C4
C3 Complement: 147 mg/dL (ref 83–193)
C4 Complement: 27 mg/dL (ref 15–57)

## 2022-08-02 LAB — SJOGRENS SYNDROME-A EXTRACTABLE NUCLEAR ANTIBODY: SSA (Ro) (ENA) Antibody, IgG: >8 AI — AB

## 2022-08-02 LAB — ANTI-DNA ANTIBODY, DOUBLE-STRANDED: ds DNA Ab: 1 IU/mL

## 2022-08-02 LAB — SEDIMENTATION RATE: Sed Rate: 48 mm/h — ABNORMAL HIGH (ref 0–20)

## 2022-08-02 LAB — RHEUMATOID FACTOR: Rheumatoid fact SerPl-aCnc: 116 IU/mL — ABNORMAL HIGH (ref <14)

## 2022-08-02 LAB — IFE INTERPRETATION

## 2022-08-02 NOTE — Telephone Encounter (Signed)
Last Fill: 05/07/2022  Labs: 07/28/2022 White cell count is lower.  Hemoglobin is lower at 11.2.   Next Visit: 12/30/2022  Last Visit: 07/28/2022  DX: Rheumatoid arthritis involving multiple sites with positive rheumatoid factor   Current Dose per office note 07/28/2022: Imuran 75 mg by mouth daily.   Okay to refill Imuran?

## 2022-08-24 ENCOUNTER — Other Ambulatory Visit: Payer: Self-pay

## 2022-08-24 DIAGNOSIS — J849 Interstitial pulmonary disease, unspecified: Secondary | ICD-10-CM

## 2022-08-25 ENCOUNTER — Ambulatory Visit (INDEPENDENT_AMBULATORY_CARE_PROVIDER_SITE_OTHER): Payer: 59 | Admitting: Internal Medicine

## 2022-08-25 DIAGNOSIS — J849 Interstitial pulmonary disease, unspecified: Secondary | ICD-10-CM | POA: Diagnosis not present

## 2022-08-25 LAB — PULMONARY FUNCTION TEST
DL/VA % pred: 145 %
DL/VA: 6.45 ml/min/mmHg/L
DLCO cor % pred: 84 %
DLCO cor: 16.11 ml/min/mmHg
DLCO unc % pred: 77 %
DLCO unc: 14.9 ml/min/mmHg
FEF 25-75 Pre: 2.46 L/sec
FEF2575-%Pred-Pre: 92 %
FEV1-%Pred-Pre: 62 %
FEV1-Pre: 1.61 L
FEV1FVC-%Pred-Pre: 111 %
FEV6-%Pred-Pre: 57 %
FEV6-Pre: 1.79 L
FEV6FVC-%Pred-Pre: 102 %
FVC-%Pred-Pre: 55 %
FVC-Pre: 1.79 L
Pre FEV1/FVC ratio: 90 %
Pre FEV6/FVC Ratio: 100 %

## 2022-08-25 NOTE — Progress Notes (Signed)
Spiro/DLCO performed today.  

## 2022-08-25 NOTE — Patient Instructions (Signed)
Spiro/DLCO performed today.  

## 2022-08-26 ENCOUNTER — Encounter: Payer: Self-pay | Admitting: Internal Medicine

## 2022-08-26 ENCOUNTER — Ambulatory Visit (INDEPENDENT_AMBULATORY_CARE_PROVIDER_SITE_OTHER): Payer: 59 | Admitting: Internal Medicine

## 2022-08-26 VITALS — BP 100/80 | HR 73 | Ht 61.0 in | Wt 148.0 lb

## 2022-08-26 DIAGNOSIS — J849 Interstitial pulmonary disease, unspecified: Secondary | ICD-10-CM | POA: Diagnosis not present

## 2022-08-26 DIAGNOSIS — Z5989 Other problems related to housing and economic circumstances: Secondary | ICD-10-CM

## 2022-08-26 DIAGNOSIS — Z79899 Other long term (current) drug therapy: Secondary | ICD-10-CM

## 2022-08-26 NOTE — Addendum Note (Signed)
Addended by: Hedda Slade on: 08/26/2022 10:42 AM   Modules accepted: Orders

## 2022-08-26 NOTE — Patient Instructions (Addendum)
ICD-10-CM   1. ILD (interstitial lung disease) (HCC)  J84.9   2. High risk medication use  Z79.899     Interstitial lung disease likely due to rheumatoid arthritis x - stable disease on Latest PFT June 2024, symptoms June 2024 and CT scan of the chest February 2024.  I think he was stable from 2021 -> 2024 but have progressed compared to 2020  Plan Continue control of acid reflux  -Sleep with head end of the bed elevated [best method is to elevate the head and using a breaker pillow at a 30 degree angle or even 20 degree angle]  - can do OTC prilosec 20mg  daily in empty stomach  -Continue Imuran via rheumatology  -  should help with joints and also has antifibrotic effect  -No indication for nintedanib antifibrotic currently given stability.  -Respect your desire to withdraw from the ILD-Pro registry study  -I have let the research coordinator know   - Monitor ILD closely; do spirometry and dlco in 8 months  Follow-up -Return in 8 months after PFT testing  = symptom score and simple walk test at followup  -  If there is evidence of continued progression then we can discuss antifibrotic therapy  -15-minute visit

## 2022-08-26 NOTE — Progress Notes (Addendum)
17/2020 --47 year old woman with RA on methotrexate, followed now for chronic cough.  She had pulmonary function testing done that showed mixed obstruction and restriction without a significant bronchodilator response.  We will treat her for reflux and her cough did improve some, did not resolve.  I tried her last time on empiric albuterol to see if she would get any benefit.  She reports that her cough is largely unchanged. She is not having dyspnea, no breakthrough GERD.  Based on the restriction on pulmonary function testing and her history of RA we obtained a CT scan of the chest 10/26/2018 that I reviewed.  This shows some mild but diffuse base-predominent groundglass change peripherally without any frank honeycombing most notable on the left.   [name], [facility] > planning to do televisit Thurs, decrease MTX, possibly add a 2nd agent.   Video visit 02/20/2019 --47 year old woman with rheumatoid arthritis on methotrexate, mixed restriction and obstruction on pulmonary function testing and mild diffuse groundglass change peripherally on CT scan of the chest.  I saw her originally for chronic cough in the setting of these issues and also GERD. She has been followed by Dr Wallace Cullens with Rheumatology. She was treated with prednisone 60mg , tapering down now on 5mg  a day. Remains on MTX 10mg  once a week. Her cough has completely resolved. Breathing well.   ROV 04/26/2019 --[name] is 47 and has a history of rheumatoid arthritis on methotrexate, mixed restriction and obstruction on PFTs.  Have seen her for chronic cough and an abnormal CT scan of the chest with peripheral groundglass changes.  She is on methotrexate 10 mg once a week, was treated with prednisone 60 mg tapering down to off as of 03/21/19. Now working w Dr Titus Dubin. She started Imuran on 04/11/19.    We repeated her CT scan of the chest on 03/14/2019 and I have reviewed, this shows persistent areas of base predominant groundglass  and septal thickening, mild peripheral bronchiolectasis especially in the left base.  No frank honeycombing.  Stable compared with 10/26/2018.  There are a few scattered tiny pulmonary nodules that are stable.  OV 05/24/2019 -first visit has interstitial lung disease center.  Transfer of care from Dr. Levy Pupa to the ILD center with Dr. Marchelle Gearing.  Subjective:  Patient ID: [name], female , DOB: Feb 24, 1976 , age 47 y.o. , MRN: [id number] , ADDRESS: [address] [place] [address]  Rheumatologist - Dr Marti Sleigh   05/24/2019 -   Chief Complaint  Patient presents with   Follow-up    Switching from RB to MR due to ILD. Pt states she does become SOB with exertion and states she does still have a cough when she laughs. Pt denies any complaints of CP.     History is gained from talking to the patient and review of the chart.   HPI [name] 47 y.o. -carries a diagnosis of rheumatoid arthritis.  Her sister also has rheumatoid arthritis.  She was diagnosed in 2019.  Initially treated with methotrexate.  Used to see Ms. [name] physician assistant at [facility] rheumatology but has switched to Dr. Pollyann Savoy.  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 Tommi Rumps 2 days ago on 05/22/2019.  Note is that she is on 100 mg once daily of Imuran tolerating it well without any side effects.  No increase in joint pain after starting Imuran but she continues to experience stiffness and swelling in the morning for 20 to 30 minutes.  Raynard itself was not active.  In terms of her lung disease she has been seeing Dr. Delton Coombes since August 2020.  A CT scan in August  2020 showed left lower lobe groundglass opacity without any honeycombing.  Acid reflux which is a ongoing problem for her was considered as an etiology.  She also had cough and shortness of breath.  She was treated with 4 months of prednisone which ended early January 2021.  After that the cough is minimal.  She has occasional acid reflux.  In terms of her shortness of breath is very minimal as documented below.  Currently overall from a pulmonary standpoint she is feeling well.  Last pulmonary function testing was in July 2020 and last CT scan of the chest was in end of December 2020 and was essentially unchanged over 4 months.   She had questions about COVID-19 vaccine.  [facility] Comprehensive ILD Questionnaire 0= filled out 06/01/2019   Symptoms: Insidious onset of shortness of breath the last 1 year.  Since it started it is the same.  There is mild cough.    Past Medical History :  -Rheumatoid arthritis diagnosed in 2019.  Has thyroid disease hypothyroidism diagnosed October 2009.  Has history of pneumonia not otherwise specified January 1998.  History is otherwise negative   ROS: Positive for joint stiffness and pain since July 2019 leading to the diagnosis of rheumatoid arthritis subsequently.  She had Raynaud's description once in January 2020.  She is ongoing acid reflux on and off since 2015   FAMILY HISTORY of LUNG DISEASE: Father has emphysema.  Sister has autoimmune disease.   EXPOSURE HISTORY: Denies cigarettes or marijuana or vaping or cocaine or intravenous drug use   HOME and HOBBY DETAILS : Single-family home for the last 16 years.  Age of the home is 17 years.  Does use a humidifier but no mildew in the humidifier.  There is some mildew in the shower curtains.  No mold in the [facility] duct.  No other organic antigen exposure in the house   OCCUPATIONAL HISTORY (122 questions) : Negative for organic antigen exposure.  Negative for inorganic antigen  exposure   PULMONARY TOXICITY HISTORY (27 items): On methotrexate between October 2019 and February 2021.  To prednisone between September 2020 and January 2021.  On Imuran since February 2021    Results for [name], Joanne "DEE DEE" (MRN [id number]) as of 05/24/2019 12:22  Ref. Range 10/09/2018 12:56  FVC-%Pred-Pre Latest Units: % 65  FEV1-Pre Latest Units: L 1.57  Results for [name], Joanne "DEE DEE" (MRN [id number]) as of 05/24/2019 12:22  Ref. Range 10/09/2018 12:56  TLC Latest Units: L 3.07  TLC % pred Latest Units: % 63  Results for [name], Joanne D "DEE DEE" (MRN [id number]) as of 05/24/2019 12:22  Ref. Range 10/09/2018 12:56  DLCO unc Latest Units: ml/min/mmHg 15.54  DLCO unc % pred Latest Units: % 75    IMPRESSION: High-resolution CT chest December 2020 Lungs/Pleura: High-resolution images again demonstrate basal predominant areas of ground-glass attenuation, septal thickening, mild thickening of the peribronchovascular interstitium and some very mild peripheral bronchiolectasis (most evident in the left lower lobe).  No frank honeycombing. Findings are essentially stable compared to the recent prior study from 10/26/2018. Inspiratory and expiratory imaging is unremarkable. A few scattered tiny pulmonary nodules are noted in the lungs bilaterally, largest of which is a 5 mm nodule in the left upper lobe (axial image 79 of series 3). No other larger more suspicious appearing pulmonary nodules or masses are noted. No pleural effusions. No acute consolidative airspace disease.   1. The appearance of the lungs is compatible with interstitial lung disease, with a stable spectrum of findings technically classified as probable usual interstitial pneumonia (UIP) per current ATS guidelines. However, the possibility of nonspecific interstitial pneumonia is not excluded. Repeat high-resolution chest CT is recommended in 12 months to assess for temporal changes in the appearance  of the lung parenchyma. 2. Tiny pulmonary nodules measuring 5 mm or less in size, stable compared to the prior study. These are nonspecific, but statistically benign. Attention at time of repeat high-resolution chest CT is recommended to ensure continued stability.     Electronically Signed   By: Trudie Reed M.D.   On: 03/14/2019 12:56 ROS - per HPI   OV 07/31/2019  Subjective:  Patient ID: [name], female , DOB: 03-May-1975 , age 78 y.o. 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 -returns for follow-up.  Since her last visit her symptoms are similar.  She reports better acid reflux control.  She had pulmonary function test that compared to a year ago shows decline documented below but she herself is feeling fine.  Her walking desaturation test is stable.  Her CT scan of the chest between August 2020 in December 2020 is stable.  She is currently on Imuran and is tolerating it well.  This is being monitored by Dr. Algis Downs.  There are no other new issues.  She reports she had echocardiogram earlier in the year and this was normal.    OV 01/29/2020   Subjective:  Patient ID: [name], female , DOB: Dec 06, 1975, age 24 y.o. years. , MRN: [id number],  ADDRESS: [address] 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 -presents for follow-up.  She continues on  Imuran.  She has had a flu shot.  She plans to have a Covid booster.  She continues to be asymptomatic/minimally symptomatic as documented below.  Walking desaturation test is stable.  She had pulmonary function test that compared to the spring of this year shows stable FVC but a slight drop in DLCO.  However there is no change in her walking desaturation test or her symptoms.  The quality of the PFT appears good.  Overall is a decline PFT since last summer but in the interim she has been on Imuran which seems to be helping her.  She has no side effects from Imuran.  She is a little concerned about the drop in  DLCO but she is willing to work with our plan.     ROS    OV 07/08/2020  Subjective:  Patient ID: [name], female , DOB: 07-Jun-1975 , age 18 y.o. , MRN: [id number] , ADDRESS: 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 -returns for follow-up.  Last visit I was concerned about more progression.  Today's symptom scores are stable, walking desaturation test is stable pulmonary function test is stable.  Symptom score is very mild.  High-resolution CT chest shows stable ILD.  No progression.  She is done her ILD-per registry visit today.  She is on Imuran and overall she is stable.    HRCT feb 2022  CLINICAL DATA:  Interstitial lung disease, history of rheumatoid arthritis   EXAM: CT CHEST WITHOUT CONTRAST    TECHNIQUE: Multidetector CT imaging of the chest was performed following the standard protocol without intravenous contrast. High resolution imaging of the lungs, as well as inspiratory and expiratory imaging, was performed.   COMPARISON:  03/14/2019   FINDINGS: Cardiovascular: No significant vascular findings. Normal heart size. No pericardial effusion.   Mediastinum/Nodes: Unchanged prominent axillary lymph nodes. No enlarged mediastinal or hilar lymph nodes. Thyroid gland, trachea, and esophagus demonstrate no significant findings.   Lungs/Pleura: Unchanged mild pulmonary fibrosis in a pattern featuring apical to basal gradient, predominantly characterized by irregular peripheral interstitial opacity and ground-glass, with minimal subpleural bronchiolectasis particularly in the left lung base. No significant air trapping on expiratory phase imaging. Stable, definitively benign 5 mm nodule of the lingula (series 5, image 150). No pleural effusion or pneumothorax.   Upper Abdomen: No acute abnormality.   Musculoskeletal: No chest wall mass or suspicious bone lesions identified.   IMPRESSION: 1. Unchanged mild pulmonary fibrosis in a pattern featuring apical to basal gradient, predominantly characterized by irregular peripheral interstitial opacity and ground-glass, with minimal subpleural bronchiolectasis particularly in the left lung base. No significant air trapping on expiratory phase imaging. 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 Findings are categorized as probable UIP per consensus guidelines: Diagnosis of Idiopathic Pulmonary Fibrosis: An Official ATS/ERS/JRS/ALAT Clinical Practice Guideline. Am Rosezetta Schlatter Crit Care Med Vol 198, Iss 5, [phone], Nov 13 2016. 2. Stable, definitively benign 5 mm nodule of the lingula.     Electronically Signed   By: Lauralyn Primes M.D.   On: 05/01/2020 16:59      OV  05/07/2021  Subjective:  Patient ID: [name], female , DOB: 1975/09/30 , age 89 y.o. , MRN: [id number] , ADDRESS: [address] [place] [address] PCP Shirlean Mylar, MD Patient Care Team: Shirlean Mylar, MD as PCP - General (Family Medicine)  This Provider for this visit: Treatment Team:  Attending Provider: Kalman Shan, MD    05/07/2021 -   Chief Complaint  Patient presents with   Follow-up    PFT performed today.  Pt states she has been  doing okay since last visit and denies any complaints.  3  HPI [name] y.o. -returns for follow-up.  Last seen 10 months ago.  Since then she has had a visit this year with Sherron Ales in rheumatology.  Imuran is being continued.  She feels quite well controlled with rheumatoid arthritis using Imuran.  There are no new problems.  She tells me in the last 1 year no emergency room visit no medication changes no hospitalizations no urgent care visits.  Shortness of breath is mild.  Walking desaturation test is stable.  Both symptom scores and walking desaturation test suggest stability.  Pulmonary function test shows DLCO is also stable but there is a slight drop in FVC.  We discussed drop in [facility] do not know what to make of it      OV 08/26/2022  Subjective:  Patient ID: [name], female , DOB: [birth year] , age 61 y.o. , MRN: [id number] , ADDRESS: [address] 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 Then sable through PFt feb 2023  -  Last high-resolution CT chest April 2022  > Then Feb 2024 CADIAC CT (reported that ILD is stable  `- Participant in ILD-pro registry  # - Rheumatoid  arthritis involving multiple sites with positive rheumatoid factor (HCC) - Positive RF, +14 3 3  eta and elevated ESR.  Diagnosed in 2019.  - Positive ANA (antinuclear antibody) - ANA 1: 1280 speckled, positive Ro antibody without SLE as of Jan 202 - MTX(12/19-02/21)-dcd after shortness of breath -- = On Imuran but not on antifibrotic  08/26/2022 -   Chief Complaint  Patient presents with   Follow-up    F/up, no complaints     HPI [place] 47 y.o. -known ILD secondary to rheumatoid arthritis on Imuran.  She presents for follow-up.  Last seen in February 2023 over a year ago.  She says she continues to be stable.  No new medical problems except late 2023 she did have COVID and we treated with Paxlovid.  Otherwise no new medical problems.  No emergency room visits no urgent care visits no surgeries.  No changes in medication.  Review of the external records indicate that she did see rheumatology Dr Corliss Skains 07/28/2022.  She did an SPEP and thought was consistent with chronic inflammatory disease.  Hemoglobin slightly lower at 11.2 g.  She advised to continue Imuran 75 mg/day.  She recommended doing CBC.  But she feels well.  Symptom scores are below and she is very minimally symptomatic.  She did have a low-dose CT scan of the chest February 2024.  Radiologist compare this low-dose CT scan with the previous high-resolution CT chest from 2-20 20 for the ILD was stable.  I independently reviewed the CT scan and visualized it and I agree with the radiology findings.  The disease burden is very minimal.  Also of note the research coordinator D Houston Callas indicated patient is a participant ILD-Pro registry.  However patient does express unwillingness to continue with this protocol.  I talked to the research coordinator today through secure chat and verbally yesterday.  I approached this issue with the patient.  Patient feels she is  stable and does not want to continue with the study.  Communicated  intent to withdraw patient from the study and stop all follow-up as per her wishes.  Voluntary consent will be withdrawn.   She is also telling me that she is finding cost of medical care quite expensive especially doing PFTs.  She wants test spaced out.  She is employed at [employer] but despite employment health healthcare cost is high.  There is a social determinant of health.  CT Chest data - personally visualized and independently interpreted and my findings are: - CORONARY CT FEB 04/22/22: ILD REPORTED AS STABLE since 2022. I agree     No results found.    SYMPTOM SCALE - ILD 05/24/2019  07/31/2019  01/29/2020 Last Weight  Most recent update: 01/29/2020  2:12 PM     Weight  67.6 kg (149 lb)             07/08/2020  05/07/2021    O2 use ra ra ra ra ra   Shortness of Breath 0 -> 5 scale with 5 being worst (score 6 If unable to do)       At rest 0 0 0 0 0   Simple tasks - showers, clothes change, eating, shaving 1 0 0 0 0   Household (dishes, doing bed, laundry) 1 0 1 0 0   Shopping 0 0 0 0 0   Walking level at own pace 1 0 0 0 0   Walking up Stairs 1 2 2 1 1    Total (30-36) Dyspnea Score 4 2 2 1 1    How bad is your cough? 2 1 0 0 1 - durin laugh   How bad is your fatigue 1 0 0 0 0   How bad is nausea 00 0 0 0 0   How bad is vomiting?  0 0  0 0   How bad is diarrhea? 0 0 0 0 0   How bad is anxiety? 0 0 0 0 0   How bad is depression 0 0 0 0 0       Simple office walk 185 feet x  3 laps goal with forehead probe 05/24/2019  07/31/2019  ra 07/08/2020  05/07/2021  08/26/2022   O2 used ra ra   ra ra  Number laps completed 3 3   3  Sit stand  Comments about pace avg Mod pace   Avg [ace   Resting Pulse Ox/HR 100% and 89/min 100% and 71 100% and 89/min 98% nand 97 100% and 65 99% and HR 61  Final Pulse Ox/HR 99% and 102/min 99% and 85 98% and 107/min 100% and 99/min 100% an 89 98% and HR 89  Desaturated </= 88% no       Desaturated <= 3% points no       Got Tachycardic >/=  90/min yes       Symptoms at end of test Very mild dyspnea  No dysonea  No complaints   Miscellaneous comments x         PFT     Latest Ref Rng & Units 08/24/2022   11:17 AM 05/07/2021    2:54 PM 07/08/2020   11:57 AM 01/29/2020   11:33 AM 07/31/2019    9:58 AM 10/09/2018   12:56 PM  PFT Results  FVC-Pre L 1.79  P 1.68  1.74  1.70  1.71    FVC-Predicted Pre % 55  P 59  60  59  59  65  P  FVC-Post L      1.88  P  FVC-Predicted Post %      65  P  Pre FEV1/FVC % % 90  P 95  92  93  89  84  P  Post FEV1/FCV % %      92  P  FEV1-Pre L 1.61  P 1.60  1.61  1.58  1.52  1.57  P  FEV1-Predicted Pre % 62  P 69  69  67  64  67  P  FEV1-Post L      1.73  P  DLCO uncorrected ml/min/mmHg 14.90  P 13.23  12.40  12.72  13.00  15.54  P  DLCO UNC% % 77  P 64  59  61  62  75  P  DLCO corrected ml/min/mmHg 16.11  P 13.23  12.90  13.43  13.00    DLCO COR %Predicted % 84  P 64  62  64  62    DLVA Predicted % 145  P 127  134  125  113  128  P  TLC L      3.07  P  TLC % Predicted %      63  P  RV % Predicted %      74  P    P Preliminary result       has a past medical history of Abnormal Pap smear, Dysplasia of cervix, low grade (CIN 1), H/O: pneumonia, Hypothyroidism, and Rheumatoid arthritis (HCC).   reports that she has never smoked. She has been exposed to tobacco smoke. She has never used smokeless tobacco.  Past Surgical History:  Procedure Laterality Date   CESAREAN SECTION     LAPAROSCOPIC TUBAL LIGATION  02/01/2011   Procedure: LAPAROSCOPIC TUBAL LIGATION;  Surgeon: Purcell Nails, MD;  Location: [facility];  Service: Gynecology;  Laterality: Bilateral;   LEEP      Allergies  Allergen Reactions   Other     Cats Fruit     Immunization History  Administered Date(s) Administered   Influenza Split 12/19/2012, 03/05/2014, 11/24/2015, 11/28/2017, 12/28/2019, 12/31/2020   Influenza,inj,Quad PF,6+ Mos 12/29/2018   Influenza,inj,quad, With Preservative 01/10/2015    Influenza-Unspecified 12/29/2021   PFIZER(Purple Top)SARS-COV-2 Vaccination 05/25/2019, 06/15/2019, 02/01/2020   Td 08/14/2003, 07/02/2015   Tdap 03/05/2014    Family History  Problem Relation Age of Onset   Hypertension Mother    Diabetes Mother    Stroke Mother    Heart attack Father 29 - 41   Hypertension Father    Rheum arthritis Sister    Hypertension Brother    Healthy Daughter    Healthy Son    Rheum arthritis Maternal Aunt    Rheum arthritis Maternal Aunt      Current Outpatient Medications:    amLODipine (NORVASC) 2.5 MG tablet, Take 1 tablet (2.5 mg total) by mouth daily., Disp: 90 tablet, Rfl: 1   aspirin-acetaminophen-caffeine (EXCEDRIN MIGRAINE) 250-250-65 MG tablet, Take by mouth as needed for headache., Disp: , Rfl:    azaTHIOprine (IMURAN) 50 MG tablet, TAKE 1 AND 1/2 TABLETS BY MOUTH DAILY, Disp: 135 tablet, Rfl: 0   levothyroxine (SYNTHROID) 50 MCG tablet, Take 1 tablet (50 mcg total) by mouth daily., Disp: 90 tablet, Rfl: 3   loratadine (CLARITIN) 10 MG tablet, TAKE 1 TABLET BY MOUTH EVERY DAY, Disp: 90 tablet, Rfl: 1   Multiple Vitamin (MULTIVITAMIN WITH MINERALS) TABS tablet, Take 1 tablet by mouth daily., Disp: ,  Rfl:    Probiotic Product (PROBIOTIC PO), Take by mouth daily., Disp: , Rfl:    Turmeric 1053 MG TABS, daily., Disp: , Rfl:       Objective:   Vitals:   08/26/22 0937  BP: 100/80  Pulse: 73  SpO2: 100%  Weight: 148 lb (67.1 kg)  Height: 5\' 1"  (1.549 m)    Estimated body mass index is 27.96 kg/m as calculated from the following:   Height as of this encounter: 5\' 1"  (1.549 m).   Weight as of this encounter: 148 lb (67.1 kg).  @WEIGHTCHANGE @  [employer]   08/26/22 0937  Weight: 148 lb (67.1 kg)     Physical Exam   General: No distress. Looks well O2 at rest: no Cane present: no Sitting in wheel chair: no Frail: no Obese: no Neuro: Alert and Oriented x 3. GCS 15. Speech normal Psych: Pleasant Resp:  Barrel Chest - no.   Wheeze - no, Crackles - no, No overt respiratory distress CVS: Normal heart sounds. Murmurs - no Ext: Stigmata of Connective Tissue Disease - RA HEENT: Normal upper airway. PEERL +. No post nasal drip        Assessment:       ICD-10-CM   1. ILD (interstitial lung disease) (HCC)  J84.9     2. High risk medication use  Z79.899     3. Has health insurance with inadequate coverage of health expenses  Z59.89          Plan:     Patient Instructions     ICD-10-CM   1. ILD (interstitial lung disease) (HCC)  J84.9   2. High risk medication use  Z79.899     Interstitial lung disease likely due to rheumatoid arthritis x - stable disease on Latest PFT June 2024, symptoms June 2024 and CT scan of the chest February 2024.  I think he was stable from 2021 -> 2024 but have progressed compared to 2020  Plan Continue control of acid reflux  -Sleep with head end of the bed elevated [best method is to elevate the head and using a breaker pillow at a 30 degree angle or even 20 degree angle]  - can do OTC prilosec 20mg  daily in empty stomach  -Continue Imuran via rheumatology  -  should help with joints and also has antifibrotic effect  -No indication for nintedanib antifibrotic currently given stability.  -Respect your desire to withdraw from the ILD-Pro registry study  -I have let the research coordinator know   - Monitor ILD closely; do spirometry and dlco in 8 months  Follow-up -Return in 8 months after PFT testing  = symptom score and simple walk test at followup  -  If there is evidence of continued progression then we can discuss antifibrotic therapy  -15-minute visit    SIGNATURE    Dr. Kalman Shan, M.D., F.C.C.P,  Pulmonary and Critical Care Medicine Staff Physician, [facility] Director - Interstitial Lung Disease  Program  Pulmonary Fibrosis [facility] [place], [place], [address]  Pager: [phone], If no  answer or between  15:00h - 7:00h: call [phone] Telephone: [phone]  10:32 AM 08/26/2022   Moderate Complexity MDM OFFICE  2021 E/M guidelines, first released in 2021, with minor revisions added in 2023 and 2024 Must meet the requirements for 2 out of 3 dimensions to qualify.    Number and complexity of problems addressed Amount  and/or complexity of data reviewed Risk of complications and/or morbidity  One or more chronic illness with mild exacerbation, OR progression, OR  side effects of treatment  Two or more stable chronic illnesses  One undiagnosed new problem with uncertain prognosis  One acute illness with systemic symptoms   One Acute complicated injury Must meet the requirements for 1 of 3 of the categories)  Category 1: Tests and documents, historian  Any combination of 3 of the following:  Assessment requiring an independent historian  Review of prior external note(s) from each unique source - Dr Beatrix Fetters  Review of results of each unique test - PFT and blood work  Ordering of each unique test  _PFT    Category 2: Interpretation of tests   Independent interpretation of a test performed by another physician/other qualified health care professional (not separately reported)  - CT chest feb 2024  Category 3: Discuss management/tests  Discussion of management or test interpretation with external physician/other qualified health care professional/appropriate source (not separately reported) Moderate risk of morbidity from additional diagnostic testing or treatment Examples only:  Prescription drug  management - immurant  Decision regarding minor surgery with identfied patient or procedure risk factors  Decision regarding elective major surgery without identified patient or procedure risk factors  Diagnosis or treatment significantly limited by social determinants of health

## 2022-08-28 ENCOUNTER — Other Ambulatory Visit: Payer: Self-pay | Admitting: Family Medicine

## 2022-08-28 DIAGNOSIS — I10 Essential (primary) hypertension: Secondary | ICD-10-CM

## 2022-09-01 ENCOUNTER — Other Ambulatory Visit: Payer: Self-pay

## 2022-09-01 DIAGNOSIS — I7300 Raynaud's syndrome without gangrene: Secondary | ICD-10-CM

## 2022-09-01 DIAGNOSIS — M3509 Sicca syndrome with other organ involvement: Secondary | ICD-10-CM

## 2022-09-01 DIAGNOSIS — J849 Interstitial pulmonary disease, unspecified: Secondary | ICD-10-CM

## 2022-09-01 DIAGNOSIS — Z8639 Personal history of other endocrine, nutritional and metabolic disease: Secondary | ICD-10-CM

## 2022-09-01 DIAGNOSIS — M0579 Rheumatoid arthritis with rheumatoid factor of multiple sites without organ or systems involvement: Secondary | ICD-10-CM

## 2022-09-01 DIAGNOSIS — Z79899 Other long term (current) drug therapy: Secondary | ICD-10-CM

## 2022-09-01 LAB — CBC WITH DIFFERENTIAL/PLATELET
Absolute Monocytes: 324 cells/uL (ref 200–950)
Basophils Absolute: 18 cells/uL (ref 0–200)
Basophils Relative: 0.4 %
Eosinophils Absolute: 203 cells/uL (ref 15–500)
Eosinophils Relative: 4.5 %
HCT: 33.4 % — ABNORMAL LOW (ref 35.0–45.0)
Hemoglobin: 10.8 g/dL — ABNORMAL LOW (ref 11.7–15.5)
Lymphs Abs: 1404 cells/uL (ref 850–3900)
MCH: 27.8 pg (ref 27.0–33.0)
MCHC: 32.3 g/dL (ref 32.0–36.0)
MCV: 85.9 fL (ref 80.0–100.0)
MPV: 10.6 fL (ref 7.5–12.5)
Monocytes Relative: 7.2 %
Neutro Abs: 2552 cells/uL (ref 1500–7800)
Neutrophils Relative %: 56.7 %
Platelets: 288 10*3/uL (ref 140–400)
RBC: 3.89 10*6/uL (ref 3.80–5.10)
RDW: 13.5 % (ref 11.0–15.0)
Total Lymphocyte: 31.2 %
WBC: 4.5 10*3/uL (ref 3.8–10.8)

## 2022-09-01 LAB — SEDIMENTATION RATE: Sed Rate: 46 mm/h — ABNORMAL HIGH (ref 0–20)

## 2022-09-02 ENCOUNTER — Encounter: Payer: Self-pay | Admitting: Family Medicine

## 2022-09-06 ENCOUNTER — Encounter (HOSPITAL_BASED_OUTPATIENT_CLINIC_OR_DEPARTMENT_OTHER): Payer: Self-pay | Admitting: Cardiovascular Disease

## 2022-09-06 ENCOUNTER — Ambulatory Visit (INDEPENDENT_AMBULATORY_CARE_PROVIDER_SITE_OTHER): Payer: 59 | Admitting: Cardiovascular Disease

## 2022-09-06 VITALS — BP 148/92 | HR 72 | Ht 61.0 in | Wt 148.4 lb

## 2022-09-06 DIAGNOSIS — E782 Mixed hyperlipidemia: Secondary | ICD-10-CM | POA: Diagnosis not present

## 2022-09-06 DIAGNOSIS — I10 Essential (primary) hypertension: Secondary | ICD-10-CM | POA: Diagnosis not present

## 2022-09-06 HISTORY — DX: Essential (primary) hypertension: I10

## 2022-09-06 NOTE — Patient Instructions (Addendum)
Medication Instructions:  Stay off amlodipine for the time being- please send [place] a mychart message in two weeks with BP readings. If consistently staying greater than 130/80, then resume amlodipine, if not you may stay off  Follow-Up: 10/3 at 8am with Gillian Shields, NP

## 2022-09-06 NOTE — Progress Notes (Signed)
Advanced Hypertension Clinic Follow Up:    Date:  09/06/2022   ID:  [employer], DOB January 15, 1976, MRN [id number]  PCP:  Deeann Saint, MD  Cardiologist:  None  Nephrologist:  Referring MD: Deeann Saint, MD   CC: Hypertension  History of Present Illness:    [name] is a 47 y.o. female with a hx of RA, hypertension and hypothyroidism here for follow up.  She was first seen in the Advanced Hypertension Clinic 04/2022.  She was diagnosed with HTN around age 47 in the setting of being on prednisone.  BP remained elevated when the prednisone was discontinued.  She was started on amlodipine 2.5 mg daily.  Renal artery Dopplers 05/2022 were normal.  Her PCP previously noted a murmur.  She had an echo 05/2022 that revealed LVEF 60-65% with normal diastolic function and no significant valvular abnormalities.  Calcium score  04/2022 was 0.  She reports that her blood pressure readings have been consistently around 120-128 mmHg, prompting her primary care physician to suggest a trial off her antihypertensive medication, amlodipine. She stopped taking the medication a few days ago, and her home readings have remained stable at around 128/80 mmHg for the first few days but was in the 140s today.  However, a reading taken during this visit was higher at 150/90 mmHg.  The patient has been making lifestyle modifications in an attempt to manage her blood pressure naturally.  She has been exercising more consistently and is working towards a weight loss goal. She attributes her hypertension to weight gain induced by prednisone, which she had to start for an unspecified condition. She hopes that returning to her pre-prednisone weight will help normalize her blood pressure.  She also mentions that she has signed up for nutrition counseling through her employer, hoping that this will aid in her weight loss and blood pressure management efforts. She denies any exercise-induced symptoms and  reports feeling well overall. She has had a calcium score done, which showed no plaque.     Previous antihypertensives:    Past Medical History:  Diagnosis Date   Abnormal Pap smear    Dysplasia of cervix, low grade (CIN 1)    Essential hypertension 09/06/2022   H/O: pneumonia    Hypothyroidism    Rheumatoid arthritis (HCC)     Past Surgical History:  Procedure Laterality Date   CESAREAN SECTION     LAPAROSCOPIC TUBAL LIGATION  02/01/2011   Procedure: LAPAROSCOPIC TUBAL LIGATION;  Surgeon: Purcell Nails, MD;  Location: [facility];  Service: Gynecology;  Laterality: Bilateral;   LEEP      Current Medications: Current Meds  Medication Sig   aspirin-acetaminophen-caffeine (EXCEDRIN MIGRAINE) 250-250-65 MG tablet Take by mouth as needed for headache.   azaTHIOprine (IMURAN) 50 MG tablet TAKE 1 AND 1/2 TABLETS BY MOUTH DAILY   levothyroxine (SYNTHROID) 50 MCG tablet Take 1 tablet (50 mcg total) by mouth daily.   loratadine (CLARITIN) 10 MG tablet TAKE 1 TABLET BY MOUTH EVERY DAY   Multiple Vitamin (MULTIVITAMIN WITH MINERALS) TABS tablet Take 1 tablet by mouth daily.   Probiotic Product (PROBIOTIC PO) Take by mouth daily.   Turmeric 1053 MG TABS daily.     Allergies:   Other   Social History   Socioeconomic History   Marital status: Married    Spouse name: Not on file   Number of children: Not on file   Years of education: Not on file   Highest education  level: Not on file  Occupational History   Not on file  Tobacco Use   Smoking status: Never    Passive exposure: Past   Smokeless tobacco: Never  Vaping Use   Vaping Use: Never used  Substance and Sexual Activity   Alcohol use: No   Drug use: No   Sexual activity: Yes    Birth control/protection: Surgical    Comment: BTL  Other Topics Concern   Not on file  Social History Narrative   Not on file   Social Determinants of Health   Financial Resource Strain: Not on file  Food Insecurity: No Food Insecurity  (05/13/2022)   Hunger Vital Sign    Worried About Running Out of Food in the Last Year: Never true    Ran Out of Food in the Last Year: Never true  Transportation Needs: No Transportation Needs (05/13/2022)   PRAPARE - Administrator, Civil Service (Medical): No    Lack of Transportation (Non-Medical): No  Physical Activity: Insufficiently Active (05/13/2022)   Exercise Vital Sign    Days of Exercise per Week: 3 days    Minutes of Exercise per Session: 30 min  Stress: Not on file  Social Connections: Not on file     Family History: The patient's family history includes Diabetes in her mother; Healthy in her daughter and son; Heart attack (age of onset: 64 - 2) in her father; Hypertension in her brother, father, and mother; Rheum arthritis in her maternal aunt, maternal aunt, and sister; Stroke in her mother.  ROS:   Please see the history of present illness.     All other systems reviewed and are negative.  EKGs/Labs/Other Studies Reviewed:    EKG:  EKG is not ordered today.    Recent Labs: 02/24/2022: TSH 1.60 07/28/2022: ALT 6; BUN 10; Creat 0.68; Potassium 3.9; Sodium 140 09/01/2022: Hemoglobin 10.8; Platelets 288   Recent Lipid Panel    Component Value Date/Time   CHOL 184 02/24/2022 1158   TRIG 51.0 02/24/2022 1158   HDL 60.20 02/24/2022 1158   CHOLHDL 3 02/24/2022 1158   VLDL 10.2 02/24/2022 1158   LDLCALC 114 (H) 02/24/2022 1158    Physical Exam:   VS:  BP (!) 148/92 (BP Location: Right Arm, Patient Position: Sitting, Cuff Size: Normal)   Pulse 72   Ht 5\' 1"  (1.549 m)   Wt 148 lb 6.4 oz (67.3 kg)   LMP 08/26/2013   SpO2 99%   BMI 28.04 kg/m  , BMI Body mass index is 28.04 kg/m. GENERAL:  Well appearing HEENT: Pupils equal round and reactive, fundi not visualized, oral mucosa unremarkable NECK:  No jugular venous distention, waveform within normal limits, carotid upstroke brisk and symmetric, no bruits, no thyromegaly LUNGS:  Clear to auscultation  bilaterally HEART:  RRR.  PMI not displaced or sustained,S1 and S2 within normal limits, no S3, no S4, no clicks, no rubs, no murmurs ABD:  Flat, positive bowel sounds normal in frequency in pitch, no bruits, no rebound, no guarding, no midline pulsatile mass, no hepatomegaly, no splenomegaly EXT:  2 plus pulses throughout, no edema, no cyanosis no clubbing SKIN:  No rashes no nodules NEURO:  Cranial nerves II through XII grossly intact, motor grossly intact throughout PSYCH:  Cognitively intact, oriented to person place and time   ASSESSMENT/PLAN:    # Hypertension: Recently stopped Amlodipine due to lower blood pressure readings, but now showing elevated readings in the clinic or at home today.  Discussed the long half-life of Amlodipine and the potential for blood pressure to rise after discontinuation. Also discussed the impact of weight loss and lifestyle modifications on blood pressure control. -Continue monitoring blood pressure at home for the next 1-2 weeks. -If average readings are above 130/80, restart Amlodipine. -Plan for follow-up in 4 months to reassess blood pressure control. -Encouraged continued lifestyle modifications including exercise, salt intake reduction, and weight loss.  # General Health Maintenance: Patient has signed up for nutrition counseling. -Continue with planned nutrition counseling.      Screening for Secondary Hypertension:     05/13/2022    1:27 PM  Causes  Drugs/Herbals Screened     - Comments No excessive caffeine nor OTC meds  Renovascular HTN Screened  Sleep Apnea N/A     - Comments no daytime somnolence nor sleep disordered breathing  Thyroid Disease Screened     - Comments hypothyroidism managed by PCP  Hyperaldosteronism Screened  Pheochromocytoma Screened  Cushing's Syndrome Screened  Coarctation of the Aorta N/A     - Comments BP symmetrical  Compliance Screened     - Comments compliant with mediation regimen    Relevant  Labs/Studies:    Latest Ref Rng & Units 07/28/2022    3:42 PM 04/14/2022   11:41 AM 12/21/2021   11:31 AM  Basic Labs  Sodium 135 - 146 mmol/L 140  139  142   Potassium 3.5 - 5.3 mmol/L 3.9  4.3  4.3   Creatinine 0.50 - 0.99 mg/dL 1.61  0.96  0.45        Latest Ref Rng & Units 02/24/2022   11:58 AM 01/12/2012    4:37 PM  Thyroid   TSH 0.35 - 5.50 uIU/mL 1.60  1.965        Latest Ref Rng & Units 05/26/2022    8:05 AM  Renin/Aldosterone   Aldosterone 0.0 - 30.0 ng/dL 7.0   Aldos/Renin Ratio 0.0 - 30.0 20.4        Latest Ref Rng & Units 05/26/2022    8:05 AM  Metanephrines/Catecholamines   Epinephrine 0 - 62 pg/mL 33   Norepinephrine 0 - 874 pg/mL 288   Dopamine 0 - 48 pg/mL <30   Metanephrines 0.0 - 88.0 pg/mL <25.0   Normetanephrines  0.0 - 218.9 pg/mL 45.6           06/04/2022    9:10 AM  Renovascular   Renal Artery [place] Completed Yes     Disposition:    FU with MD/PharmD in 4 months    Medication Adjustments/Labs and Tests Ordered: Current medicines are reviewed at length with the patient today.  Concerns regarding medicines are outlined above.  No orders of the defined types were placed in this encounter.  No orders of the defined types were placed in this encounter.    Signed, Chilton Si, MD  09/06/2022 5:48 PM    [facility]

## 2022-11-04 ENCOUNTER — Encounter: Payer: Self-pay | Admitting: Dietician

## 2022-11-04 ENCOUNTER — Encounter: Payer: 59 | Attending: Family Medicine | Admitting: Dietician

## 2022-11-04 VITALS — Wt 145.7 lb

## 2022-11-04 DIAGNOSIS — Z713 Dietary counseling and surveillance: Secondary | ICD-10-CM | POA: Insufficient documentation

## 2022-11-04 NOTE — Patient Instructions (Addendum)
Goal: limit sweet intake to 2 times per week.   Goal: increase walking from 20 min to 40 min, continue doing 3-4 times per week.

## 2022-11-04 NOTE — Progress Notes (Signed)
Medical Nutrition Therapy  Appointment Start time:  1600  Appointment End time:  1652  Primary concerns today: questions about hypertension and processed food   Referral diagnosis: Employee visit 1, no referral diagnosis.  Preferred learning style: no preference indicated Learning readiness: ready   NUTRITION ASSESSMENT   Anthropometrics  Wt: 145 lb 11.2 oz  Clinical Medical Hx: arthritis, HTN, thyroid disease Medications: levothyroxine Labs: 09/01/22 hemoglobin 10.8,  Notable Signs/Symptoms: none reported Food Allergies: fruit   Lifestyle & Dietary Hx  Pt states she takes low dose medication for hypertension and wants to eventually get off of it.   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 She states she eats fruit often now and she just gets a breakout on her face, so she thinks her allergy is not as bad. She states she eats fruit 2-3 times a day.   Pt states she rarely eats pork unless pepperoni on a pizza. Pt states she doesn't eat a lot of fried food.   Pt states she doesn't eat after 6:30pm.   Pt states she got a walking pad/treadmill at home, sometimes walks while watching TV or in a meeting.   Pt lives with husband, daughter (25), and son (70).   Pt states she likes to get something sweet on the weekend.   Estimated daily fluid intake: 64+ oz Supplements: probiotic, turmeric, MVI Sleep: 10pm-6am, 7-8 hours.  Stress / self-care: pt states stress isn't bad.  Current average weekly physical activity: walking or elliptical 3-4 times per week for 20-30 minutes  24-Hr Dietary Recall First Meal: boiled or scrambled eggs Snack: apple Second Meal: chicken/turkey and rice and green beans Snack: peanuts Third Meal: chicken/turkey/beef and starch and vegetables Snack: none Beverages: water, coffee 2-3 c per week    NUTRITION DIAGNOSIS  NB-1.1 Food and nutrition-related  knowledge deficit As related to lack of prior nutrition education by a nutrition professional.  As evidenced by pt report.   NUTRITION INTERVENTION  Nutrition education (E-1) on the following topics:   Hypertension Nutrition Therapy: -Limiting Sodium Even if you are taking medications for your blood pressure, you should still limit how much sodium you consume. In general, people with high blood pressure should get between 1,500 milligrams (mg) and 2,300 mg sodium per day. -Tips to Cut Sodium In general, foods with more than 300 mg sodium per serving may not fit into your meal plan. You can find out how much sodium is in a food by reading the food label. Remember the amount listed is for one serving, which may be more or less than you eat.) Do not salt food at the table; add very little when cooking. Choose carefully when you eat away from home. Restaurant foods can be very high in sodium. Let the person taking your order know that you are looking for low-salt or no-salt choices. Many restaurants have special menus or will make food with less salt. -Fats Eating the right types of fat and avoiding the unhealthy ones helps to reduce the buildup of plaque in your blood vessels. This lowers your risk for strokes or heart attacks.  Discussed nutrient label reading for sodium.   Processed foods: Pt is encouraged to consume less processed foods, as they have the potential to negatively affect overall health. Processed foods often destroy or remove nutrients from the product and/or have added salt, sugar, and saturated fats.  Although not all processed foods are a concern, it is advised to have the majority of our foods be unprocessed or minimally processed as opposed to processed and ultra-processed. An example of this would be a whole apple (unprocessed), prepackaged apple slices with no additives (minimally processed), unsweetened applesauce (processed), and sweetened applesauce or apple juice with high  fructose corn syrup (ultra-processed).   Handouts Provided Include  Plate Method  Learning Style & Readiness for Change Teaching method utilized: Visual & Auditory  Demonstrated degree of understanding via: Teach Back  Barriers to learning/adherence to lifestyle change: none  Goals Established by Pt  Goal: limit sweet intake to 2 times per week.   Goal: increase walking from 20 min to 40 min, continue doing 3-4 times per week.    MONITORING & EVALUATION Dietary intake, weekly physical activity, and follow up in 2 months.  Next Steps  Patient is to call for questions.

## 2022-11-11 ENCOUNTER — Other Ambulatory Visit: Payer: Self-pay | Admitting: Physician Assistant

## 2022-11-11 ENCOUNTER — Other Ambulatory Visit: Payer: Self-pay | Admitting: *Deleted

## 2022-11-11 DIAGNOSIS — Z79899 Other long term (current) drug therapy: Secondary | ICD-10-CM

## 2022-11-11 NOTE — Telephone Encounter (Signed)
Last Fill: 08/02/2022  Labs: 07/28/2022 White cell count is lower.  Hemoglobin is lower at 11.2.  Sedimentation rate is elevated at 48.  SSA antibody is positive and is stable.  Rheumatoid factor is higher at 116.  SPEP is consistent with chronic inflammatory disease.  Urine protein creatinine ratio is normal double-stranded DNA is negative complements are normal.   Next Visit: 12/30/2022  Last Visit: 07/28/2022  DX: Rheumatoid arthritis involving multiple sites with positive rheumatoid factor   Current Dose per lab note 07/28/2022: Imuran 75 mg p.o. daily   Patient advised she is due to update labs. Patient will come in to the office to update labs.   Okay to refill Imuran?

## 2022-11-12 NOTE — Progress Notes (Signed)
Please see if iron panel can be added?

## 2022-11-12 NOTE — Progress Notes (Signed)
Globulin remains slightly elevated. Rest of CMP WNL  Anemia remains stable.  Please clarify if she sees a hematologist?

## 2022-11-13 LAB — COMPLETE METABOLIC PANEL WITH GFR
AG Ratio: 1.1 (calc) (ref 1.0–2.5)
ALT: 8 U/L (ref 6–29)
AST: 22 U/L (ref 10–35)
Albumin: 4.2 g/dL (ref 3.6–5.1)
Alkaline phosphatase (APISO): 49 U/L (ref 31–125)
BUN: 13 mg/dL (ref 7–25)
CO2: 29 mmol/L (ref 20–32)
Calcium: 9.6 mg/dL (ref 8.6–10.2)
Chloride: 104 mmol/L (ref 98–110)
Creat: 0.71 mg/dL (ref 0.50–0.99)
Globulin: 3.9 g/dL — ABNORMAL HIGH (ref 1.9–3.7)
Glucose, Bld: 91 mg/dL (ref 65–99)
Potassium: 4.1 mmol/L (ref 3.5–5.3)
Sodium: 138 mmol/L (ref 135–146)
Total Bilirubin: 0.4 mg/dL (ref 0.2–1.2)
Total Protein: 8.1 g/dL (ref 6.1–8.1)
eGFR: 105 mL/min/{1.73_m2} (ref >=60)

## 2022-11-13 LAB — CBC WITH DIFFERENTIAL/PLATELET
Absolute Monocytes: 402 {cells}/uL (ref 200–950)
Basophils Absolute: 31 {cells}/uL (ref 0–200)
Basophils Relative: 0.8 %
Eosinophils Absolute: 140 {cells}/uL (ref 15–500)
Eosinophils Relative: 3.6 %
HCT: 34.1 % — ABNORMAL LOW (ref 35.0–45.0)
Hemoglobin: 10.8 g/dL — ABNORMAL LOW (ref 11.7–15.5)
Lymphs Abs: 1580 {cells}/uL (ref 850–3900)
MCH: 28.5 pg (ref 27.0–33.0)
MCHC: 31.7 g/dL — ABNORMAL LOW (ref 32.0–36.0)
MCV: 90 fL (ref 80.0–100.0)
MPV: 10.9 fL (ref 7.5–12.5)
Monocytes Relative: 10.3 %
Neutro Abs: 1747 {cells}/uL (ref 1500–7800)
Neutrophils Relative %: 44.8 %
Platelets: 260 10*3/uL (ref 140–400)
RBC: 3.79 10*6/uL — ABNORMAL LOW (ref 3.80–5.10)
RDW: 13.8 % (ref 11.0–15.0)
Total Lymphocyte: 40.5 %
WBC: 3.9 10*3/uL (ref 3.8–10.8)

## 2022-11-13 LAB — IRON,TIBC AND FERRITIN PANEL
%SAT: 27 % (ref 16–45)
Ferritin: 55 ng/mL (ref 16–232)
Iron: 80 ug/dL (ref 40–190)
TIBC: 291 ug/dL (ref 250–450)

## 2022-11-13 LAB — TEST AUTHORIZATION

## 2022-11-16 NOTE — Progress Notes (Signed)
Iron panel WNL

## 2022-11-30 ENCOUNTER — Encounter: Payer: 59 | Attending: Family Medicine | Admitting: Dietician

## 2022-11-30 ENCOUNTER — Encounter: Payer: Self-pay | Admitting: Dietician

## 2022-11-30 DIAGNOSIS — I10 Essential (primary) hypertension: Secondary | ICD-10-CM | POA: Insufficient documentation

## 2022-11-30 NOTE — Progress Notes (Signed)
Medical Nutrition Therapy  Appointment Start time:  1000  Appointment End time:  1020  Primary concerns today: questions about hypertension and processed food   Referral diagnosis: Employee visit 2, no referral diagnosis.  Preferred learning style: no preference indicated Learning readiness: ready   NUTRITION ASSESSMENT   Anthropometrics  Wt: 11/30/22: 147 lb Wt: 11/04/22: 145 lb 11.2 oz  Clinical Medical Hx: arthritis, HTN, thyroid disease Medications: levothyroxine Labs: 09/01/22 hemoglobin 10.8,  Notable Signs/Symptoms: none reported Food Allergies: fruit   Lifestyle & Dietary Hx  Pt states she switched to whole wheat bread and added greek yogurt into her diet and states she has been adding chia seeds to her yogurt.   Pt states she has been still walking 3-4 times per week and has increased 1-2 of those days from 20 to 40 minutes but wants to aim to work towards doing 40 minutes each time.   Pt states she reduced sweet intake to only on the weekends. She states she went to her nieces birthday party and only had a small piece of cake.   Pt states she has been practicing some nutrient label reading.  Estimated daily fluid intake: 64+ oz Supplements: probiotic, turmeric, MVI Sleep: 10pm-6am, 7-8 hours.  Stress / self-care: pt states stress isn't bad.  Current average weekly physical activity: walking or elliptical 3-4 times per week for 20-40 minutes  24-Hr Dietary Recall First Meal: boiled or scrambled eggs on whole wheat toast Snack: apple Second Meal: ground [place] meatloaf and greens Snack: peanuts Third Meal: chicken/turkey/beef and starch and vegetables Snack: none Beverages: water, coffee 2-3 c per week    NUTRITION DIAGNOSIS  NB-1.1 Food and nutrition-related knowledge deficit As related to lack of prior nutrition education by a nutrition professional.  As evidenced by pt report.   NUTRITION INTERVENTION  Nutrition education (E-1) on the following topics:    Hypertension Nutrition Therapy: -Limiting Sodium Even if you are taking medications for your blood pressure, you should still limit how much sodium you consume. In general, people with high blood pressure should get between 1,500 milligrams (mg) and 2,300 mg sodium per day. -Tips to Cut Sodium In general, foods with more than 300 mg sodium per serving may not fit into your meal plan. You can find out how much sodium is in a food by reading the food label. Remember the amount listed is for one serving, which may be more or less than you eat.) Do not salt food at the table; add very little when cooking. Choose carefully when you eat away from home. Restaurant foods can be very high in sodium. Let the person taking your order know that you are looking for low-salt or no-salt choices. Many restaurants have special menus or will make food with less salt. -Fats Eating the right types of fat and avoiding the unhealthy ones helps to reduce the buildup of plaque in your blood vessels. This lowers your risk for strokes or heart attacks.  Discussed nutrient label reading for sodium.   Processed foods: Pt is encouraged to consume less processed foods, as they have the potential to negatively affect overall health. Processed foods often destroy or remove nutrients from the product and/or have added salt, sugar, and saturated fats. Although not all processed foods are a concern, it is advised to have the majority of our foods be unprocessed or minimally processed as opposed to processed and ultra-processed. An example of this would be a whole apple (unprocessed), prepackaged apple slices with no  additives (minimally processed), unsweetened applesauce (processed), and sweetened applesauce or apple juice with high fructose corn syrup (ultra-processed).   Handouts Provided Include  Plate Method (initial appt).   Learning Style & Readiness for Change Teaching method utilized: Visual & Auditory  Demonstrated  degree of understanding via: Teach Back  Barriers to learning/adherence to lifestyle change: none  Goals Established by Pt  Pt would like to continue previous goals:   Goal: limit sweet intake to 2 times per week. - in progress, continue.  Goal: increase walking from 20 min to 40 min, continue doing 3-4 times per week. - in progress, continue.    MONITORING & EVALUATION Dietary intake, weekly physical activity, and follow up in 1 months.  Next Steps  Patient is to call for questions.

## 2022-12-02 ENCOUNTER — Telehealth: Payer: Self-pay | Admitting: Adult Health

## 2022-12-02 MED ORDER — NIRMATRELVIR/RITONAVIR (PAXLOVID)TABLET: 3.0000 | ORAL_TABLET | Freq: Two times a day (BID) | ORAL | 0 refills | Status: AC

## 2022-12-02 NOTE — Telephone Encounter (Signed)
Patient called in after clinic call -1 day sx of cough, sore throat. Son has covid and she tested positive this evening . Request paxlovid be called in . She had covid last 01/2022 , took Paxlovid and did well with mild URI sx .  No chest pain, dyspnea, n/vd, edema or hemoptysis Low grade fever. Denies pregnancy.  Paxlovid sent to pharm. She is to hold imuran while taking .  Patient to follow up with our office as planned and As needed   Please contact office for sooner follow up if symptoms do not improve or worsen or seek emergency care

## 2022-12-16 ENCOUNTER — Ambulatory Visit (INDEPENDENT_AMBULATORY_CARE_PROVIDER_SITE_OTHER): Payer: 59 | Admitting: Family

## 2022-12-16 ENCOUNTER — Encounter (HOSPITAL_BASED_OUTPATIENT_CLINIC_OR_DEPARTMENT_OTHER): Payer: Self-pay | Admitting: Family

## 2022-12-16 ENCOUNTER — Encounter: Payer: Self-pay | Admitting: Internal Medicine

## 2022-12-16 VITALS — BP 128/87 | HR 66 | Ht 61.0 in | Wt 146.8 lb

## 2022-12-16 DIAGNOSIS — I10 Essential (primary) hypertension: Secondary | ICD-10-CM | POA: Diagnosis not present

## 2022-12-16 MED ORDER — AMLODIPINE BESYLATE 2.5 MG PO TABS: 2.5000 mg | ORAL_TABLET | Freq: Every day | ORAL | 3 refills | Status: DC

## 2022-12-16 NOTE — Progress Notes (Signed)
Advanced [facility]pertension [facility] Assessment:    Date:  12/16/2022   ID:  [employer], DOB [birth year], MRN [id number]  PCP:  Joanne Saint, MD  Cardiologist:  None  Nephrologist:  Referring MD: Joanne Saint, MD   CC: Hypertension  History of Present Illness:    [name] is a 47 y.o. female with a hx of RA, hypothyroidism, HTN here to follow up in the [facility]pertension [facility].   [place] [name] was diagnosed with hypertension early 2020 (around age 30) in the setting of taking prednisone however it did not return to normal after stopping prednisone.  At initial visit 04/2022 she was started on amlodipine 2.5 mg daily.  Coronary CTA 04/2022 calcium score of 0.  Echo ordered due to murmur noted by her OB/GYN performed 05/2022 normal LVEF 60 to 65%, no valvular abnormalities.  Renal duplex 05/2022 with no stenosis.  05/2022 normal renin-aldosterone, cortisol, catecholamines, metanephrines.  She was last seen 624/24 with BP readings 120-128 mmHg.  She had stopped taking the medication a few days prior at the direction of her PCP with BP the first few days 128/80 however on day of her visit 140s and 150/90.  She was to continue monitoring home blood pressure for the next 1 to 2 weeks and if average readings greater than 130/80 resume amlodipine.  Presents today for follow-up. She restarted amlodipine shortly after her last office visit. BP readings at home 110s-120s with one lower reading in the 100s. Denies symptoms. Saw a dietitian and has been making diet changes. Walks on the treadmill 20-40 minutes per day about four times per week. Reports no shortness of breath nor dyspnea on exertion. Reports no chest pain, pressure, or tightness. No edema, orthopnea, PND. Reports no palpitations.  Previous antihypertensives:   Past Medical History:  Diagnosis Date   Abnormal Pap smear    Dysplasia of cervix, low grade (CIN 1)    Essential hypertension 09/06/2022   H/O:  pneumonia    Hypothyroidism    Rheumatoid arthritis (HCC)     Past Surgical History:  Procedure Laterality Date   CESAREAN SECTION     LAPAROSCOPIC TUBAL LIGATION  02/01/2011   Procedure: LAPAROSCOPIC TUBAL LIGATION;  Surgeon: Purcell Nails, MD;  Location: [facility];  Service: Gynecology;  Laterality: Bilateral;   LEEP      Current Medications: Current Meds  Medication Sig   amLODipine (NORVASC) 2.5 MG tablet Take 2.5 mg by mouth daily.   aspirin-acetaminophen-caffeine (EXCEDRIN MIGRAINE) 250-250-65 MG tablet Take by mouth as needed for headache.   azaTHIOprine (IMURAN) 50 MG tablet TAKE 1 AND 1/2 TABLETS DAILY BY MOUTH   levothyroxine (SYNTHROID) 50 MCG tablet Take 1 tablet (50 mcg total) by mouth daily.   loratadine (CLARITIN) 10 MG tablet TAKE 1 TABLET BY MOUTH EVERY DAY   Multiple Vitamin (MULTIVITAMIN WITH MINERALS) TABS tablet Take 1 tablet by mouth daily.   Probiotic Product (PROBIOTIC PO) Take by mouth daily.   Turmeric 1053 MG TABS daily.     Allergies:   Other   Social History   Socioeconomic History   Marital status: Married    Spouse name: Not on file   Number of children: Not on file   Years of education: Not on file   Highest education level: Not on file  Occupational History   Not on file  Tobacco Use   Smoking status: Never    Passive exposure: Past   Smokeless tobacco: Never  Vaping  Use   Vaping status: Never Used  Substance and Sexual Activity   Alcohol use: No   Drug use: No   Sexual activity: Yes    Birth control/protection: Surgical    Comment: BTL  Other Topics Concern   Not on file  Social History Narrative   Not on file   Social Determinants of Health   Financial Resource Strain: Not on file  Food Insecurity: No Food Insecurity (11/04/2022)   Hunger Vital Sign    Worried About Running Out of Food in the Last Year: Never true    Ran Out of Food in the Last Year: Never true  Transportation Needs: No Transportation Needs (05/13/2022)    PRAPARE - Administrator, Civil Service (Medical): No    Lack of Transportation (Non-Medical): No  Physical Activity: Insufficiently Active (05/13/2022)   Exercise Vital Sign    Days of Exercise per Week: 3 days    Minutes of Exercise per Session: 30 min  Stress: Not on file  Social Connections: Not on file    Family History: The patient's family history includes Diabetes in her mother; Healthy in her daughter and son; Heart attack (age of onset: 74 - 55) in her father; Hypertension in her brother, father, and mother; Rheum arthritis in her maternal aunt, maternal aunt, and sister; Stroke in her mother.  ROS:   Please see the history of present illness.     All other systems reviewed and are negative.  EKGs/Labs/Other Studies Reviewed:    EKG Interpretation Date/Time:  Thursday December 16 2022 08:03:20 EDT Ventricular Rate:  66 PR Interval:  158 QRS Duration:  90 QT Interval:  394 QTC Calculation: 413 R Axis:   48  Text Interpretation: Normal sinus rhythm Normal ECG Confirmed by Gillian Shields ([id number]) on 12/16/2022 8:06:02 AM    Recent Labs: 02/24/2022: TSH 1.60 11/11/2022: ALT 8; BUN 13; Creat 0.71; Hemoglobin 10.8; Platelets 260; Potassium 4.1; Sodium 138   Recent Lipid Panel    Component Value Date/Time   CHOL 184 02/24/2022 1158   TRIG 51.0 02/24/2022 1158   HDL 60.20 02/24/2022 1158   CHOLHDL 3 02/24/2022 1158   VLDL 10.2 02/24/2022 1158   LDLCALC 114 (H) 02/24/2022 1158    Physical Exam:   VS:  BP 128/87   Pulse 66   Ht 5\' 1"  (1.549 m)   Wt 146 lb 12.8 oz (66.6 kg)   LMP 08/26/2013   SpO2 100%   BMI 27.74 kg/m  , BMI Body mass index is 27.74 kg/m. GENERAL:  Well appearing HEENT: Pupils equal round and reactive, fundi not visualized, oral mucosa unremarkable NECK:  No jugular venous distention, waveform within normal limits, carotid upstroke brisk and symmetric, no bruits, no thyromegaly LYMPHATICS:  No cervical adenopathy LUNGS:  Clear to  auscultation bilaterally HEART:  RRR.  PMI not displaced or sustained,S1 and S2 within normal limits, no S3, no S4, no clicks, no rubs, no murmurs ABD:  Flat, positive bowel sounds normal in frequency in pitch, no bruits, no rebound, no guarding, no midline pulsatile mass, no hepatomegaly, no splenomegaly EXT:  2 plus pulses throughout, no edema, no cyanosis no clubbing SKIN:  No rashes no nodules NEURO:  Cranial nerves II through XII grossly intact, motor grossly intact throughout PSYCH:  Cognitively intact, oriented to person place and time   ASSESSMENT/PLAN:    HTN - BP is well controlled on amlodipine 2.5mg  daily. Refills provided, continue same. Secondary workup unremarkable, as below. Discussed  to monitor BP at home at least 2 hours after medications and sitting for 5-10 minutes.   Murmur - previously noted by OB/GYN. Echo 04/2022 normal LVEF, no significant valvular abnormalities. No murmur appreciated on exam. No further workup needed.  Hypothyroidism - managed by PCP. Continue current levothyroxine dose.  RA - Follows with rheumatology. Able to tolerate exercise.    Screening for Secondary Hypertension:     05/13/2022    1:27 PM  Causes  Drugs/Herbals Screened     - Comments No excessive caffeine nor OTC meds  Renovascular HTN Screened  Sleep Apnea N/A     - Comments no daytime somnolence nor sleep disordered breathing  Thyroid Disease Screened     - Comments hypothyroidism managed by PCP  Hyperaldosteronism Screened  Pheochromocytoma Screened  Cushing's Syndrome Screened  Coarctation of the Aorta N/A     - Comments BP symmetrical  Compliance Screened     - Comments compliant with mediation regimen    Relevant Labs/Studies:    Latest Ref Rng & Units 11/11/2022   12:16 PM 07/28/2022    3:42 PM 04/14/2022   11:41 AM  Basic Labs  Sodium 135 - 146 mmol/L 138  140  139   Potassium 3.5 - 5.3 mmol/L 4.1  3.9  4.3   Creatinine 0.50 - 0.99 mg/dL 1.61  0.96  0.45         Latest Ref Rng & Units 02/24/2022   11:58 AM 01/12/2012    4:37 PM  Thyroid   TSH 0.35 - 5.50 uIU/mL 1.60  1.965        Latest Ref Rng & Units 05/26/2022    8:05 AM  Renin/Aldosterone   Aldosterone 0.0 - 30.0 ng/dL 7.0   Aldos/Renin Ratio 0.0 - 30.0 20.4        Latest Ref Rng & Units 05/26/2022    8:05 AM  Metanephrines/Catecholamines   Epinephrine 0 - 62 pg/mL 33   Norepinephrine 0 - 874 pg/mL 288   Dopamine 0 - 48 pg/mL <30   Metanephrines 0.0 - 88.0 pg/mL <25.0   Normetanephrines  0.0 - 218.9 pg/mL 45.6           06/04/2022    9:10 AM  Renovascular   Renal Artery [place] Completed Yes      Disposition:    FU with MD/PharmD/APP in 1 year   Medication Adjustments/Labs and Tests Ordered: Current medicines are reviewed at length with the patient today.  Concerns regarding medicines are outlined above.  Orders Placed This Encounter  Procedures   EKG 12-Lead   No orders of the defined types were placed in this encounter.    Signed, Alver Sorrow, NP  12/16/2022 8:07 AM     [facility]

## 2022-12-16 NOTE — Patient Instructions (Addendum)
Medication Instructions:  Continue your current medications.     Testing/Procedures: Your EKG today showed normal sinus rhythm which is a good result!   Follow-Up:  1 year in ADV HTN CLINIC with Dr. Duke Salvia or Gillian Shields, NP   Special Instructions:   Heart Healthy Diet Recommendations: A low-salt diet is recommended. Meats should be grilled, baked, or boiled. Avoid fried foods. Focus on lean protein sources like fish or chicken with vegetables and fruits. The American Heart Association is a Chief Technology Officer!  American Heart Association Diet and Lifeystyle Recommendations    Exercise recommendations: The American Heart Association recommends 150 minutes of moderate intensity exercise weekly. Try 30 minutes of moderate intensity exercise 4-5 times per week. This could include walking, jogging, or swimming.

## 2022-12-17 NOTE — Progress Notes (Unsigned)
Office Visit Note  Patient: [name]             Date of Birth: October 31, 1975           MRN: [id number]             PCP: Deeann Saint, MD Referring: Deeann Saint, MD Visit Date: 12/30/2022 Occupation: @GUAROCC @  Subjective:  Medication monitoring  History of Present Illness: [name] is a 47 y.o. female with history of seropositive rheumatoid arthritis, sjogren's syndrome, and ILD.  Patient remains on Imuran 75 mg by mouth daily.  She is tolerating Imuran without any side effects.  Patient reports that about 1 month ago she was diagnosed with COVID-19.  She was treated with Paxlovid and held Imuran during that time.  She denies any other recurrent infections.  She denies any signs or symptoms of a rheumatoid arthritis flare.  She denies any joint swelling at this time.  She denies any sicca symptoms.  She continues to see the dentist every 6 months and ophthalmology on a yearly basis.  Patient was evaluated by Dr. Marchelle Gearing on 08/26/2022 and no medication changes were made at that time.     Activities of Daily Living:  Patient reports morning stiffness for 0 minute.   Patient Denies nocturnal pain.  Difficulty dressing/grooming: Denies Difficulty climbing stairs: Denies Difficulty getting out of chair: Denies Difficulty using hands for taps, buttons, cutlery, and/or writing: Denies  Review of Systems  Constitutional:  Negative for fatigue.  HENT:  Negative for mouth sores and mouth dryness.   Eyes:  Negative for dryness.  Respiratory:  Negative for shortness of breath.   Cardiovascular:  Negative for chest pain and palpitations.  Gastrointestinal:  Negative for blood in stool, constipation and diarrhea.  Endocrine: Negative for increased urination.  Genitourinary:  Negative for involuntary urination.  Musculoskeletal:  Negative for joint pain, gait problem, joint pain, joint swelling, myalgias, muscle weakness, morning stiffness, muscle tenderness and  myalgias.  Skin:  Negative for color change, rash, hair loss and sensitivity to sunlight.  Allergic/Immunologic: Negative for susceptible to infections.  Neurological:  Negative for dizziness and headaches.  Hematological:  Negative for swollen glands.  Psychiatric/Behavioral:  Negative for depressed mood and sleep disturbance. The patient is not nervous/anxious.     PMFS History:  Patient Active Problem List   Diagnosis Date Noted   Essential hypertension 09/06/2022   Abnormal cervical Papanicolaou smear 07/08/2020   Cyst of ovary 07/08/2020   Encounter for therapeutic drug level monitoring 07/08/2020   Hypothyroidism 07/08/2020   Irregular periods 07/08/2020   Menopause 07/08/2020   Mixed hyperlipidemia 07/08/2020   Oligomenorrhea 07/08/2020   Pain in pelvis 07/08/2020   Stenosis of cervix 07/08/2020   Vaginal high risk human papillomavirus (HPV) DNA test positive 07/08/2020   Vitamin D deficiency 07/08/2020   ILD (interstitial lung disease) (HCC) 02/20/2019   Restrictive lung disease 10/09/2018   RA (rheumatoid arthritis) (HCC) 09/07/2018   H/O: pneumonia    Dysplasia of cervix, low grade (CIN 1 and 2)     Past Medical History:  Diagnosis Date   Abnormal Pap smear    Dysplasia of cervix, low grade (CIN 1)    Essential hypertension 09/06/2022   H/O: pneumonia    Hypothyroidism    Rheumatoid arthritis (HCC)     Family History  Problem Relation Age of Onset   Hypertension Mother    Diabetes Mother    Stroke Mother    Heart  attack Father 62 - 88   Hypertension Father    Rheum arthritis Sister    Hypertension Brother    Healthy Daughter    Healthy Son    Rheum arthritis Maternal Aunt    Rheum arthritis Maternal Aunt    Past Surgical History:  Procedure Laterality Date   CESAREAN SECTION     LAPAROSCOPIC TUBAL LIGATION  02/01/2011   Procedure: LAPAROSCOPIC TUBAL LIGATION;  Surgeon: Purcell Nails, MD;  Location: [facility];  Service: Gynecology;  Laterality:  Bilateral;   LEEP     Social History   Social History Narrative   Not on file   Immunization History  Administered Date(s) Administered   Influenza Split 12/19/2012, 03/05/2014, 11/24/2015, 11/28/2017, 12/28/2019, 12/31/2020   Influenza,inj,Quad PF,6+ Mos 12/29/2018   Influenza,inj,quad, With Preservative 01/10/2015   Influenza-Unspecified 12/29/2021   PFIZER(Purple Top)SARS-COV-2 Vaccination 05/25/2019, 06/15/2019, 02/01/2020   Td 08/14/2003, 07/02/2015   Tdap 03/05/2014     Objective: Vital Signs: BP 133/88 (BP Location: Left Arm, Patient Position: Sitting, Cuff Size: Normal)   Pulse 61   Resp 12   Ht 5\' 1"  (1.549 m)   Wt 147 lb (66.7 kg)   LMP 08/26/2013   BMI 27.78 kg/m    Physical Exam Vitals and nursing note reviewed.  Constitutional:      Appearance: She is well-developed.  HENT:     Head: Normocephalic and atraumatic.  Eyes:     Conjunctiva/sclera: Conjunctivae normal.  Cardiovascular:     Rate and Rhythm: Normal rate and regular rhythm.     Heart sounds: Normal heart sounds.  Pulmonary:     Effort: Pulmonary effort is normal.     Breath sounds: Normal breath sounds.  Abdominal:     General: Bowel sounds are normal.     Palpations: Abdomen is soft.  Musculoskeletal:     Cervical back: Normal range of motion.  Lymphadenopathy:     Cervical: No cervical adenopathy.  Skin:    General: Skin is warm and dry.     Capillary Refill: Capillary refill takes less than 2 seconds.  Neurological:     Mental Status: She is alert and oriented to person, place, and time.  Psychiatric:        Behavior: Behavior normal.      Musculoskeletal Exam: C-spine, thoracic spine, lumbar spine good range of motion.  Shoulder joints, elbow joints, wrist joints, MCPs, PIPs, DIPs have good range of motion with no synovitis. Complete fist formation bilaterally.  Hip joints have good ROM with no groin pain.  Knee joints have good ROM with no warmth or effusion.  Ankle joints have  good ROM with no tenderness or swelling.   CDAI Exam: CDAI Score: -- Patient Global: 0 / 100; Provider Global: 0 / 100 Swollen: --; Tender: -- Joint Exam 12/30/2022   No joint exam has been documented for this visit   There is currently no information documented on the homunculus. Go to the Rheumatology activity and complete the homunculus joint exam.  Investigation: No additional findings.  Imaging: No results found.  Recent Labs: Lab Results  Component Value Date   WBC 3.9 11/11/2022   HGB 10.8 (L) 11/11/2022   PLT 260 11/11/2022   NA 138 11/11/2022   K 4.1 11/11/2022   CL 104 11/11/2022   CO2 29 11/11/2022   GLUCOSE 91 11/11/2022   BUN 13 11/11/2022   CREATININE 0.71 11/11/2022   BILITOT 0.4 11/11/2022   ALKPHOS 92 07/07/2007   AST 22  11/11/2022   ALT 8 11/11/2022   PROT 8.1 11/11/2022   ALBUMIN 1.8 (L) 07/07/2007   CALCIUM 9.6 11/11/2022   GFRAA 122 06/12/2020   QFTBGOLDPLUS NEGATIVE 03/07/2019    Speciality Comments: Treated with methotrexate (December 2019-February 2021)which was stopped due to shortness of breath. Imuran and started in February 2021  Procedures:  No procedures performed Allergies: Other    Assessment / Plan:     Visit Diagnoses: Rheumatoid arthritis involving multiple sites with positive rheumatoid factor (HCC) - Positive RF, +14 3 3  eta and elevated ESR.  Diagnosed in 2019. MTX(12/19-02/21)-dcd after shortness of breath: She has no synovitis on examination today.  She has not had any signs or symptoms of a rheumatoid arthritis flare.  She has clinically been doing well taking Imuran 75 mg by mouth daily.  She is tolerating Imuran without any side effects.  She has not been experiencing any morning stiffness, nocturnal pain, or difficulty with ADLs.  She will remain on Imuran as monotherapy.  She was advised to notify us if she develops increased joint pain or joint swelling.  She will follow-up in the office in 5 months or sooner if  needed.  High risk medication use - Imuran 75 mg by mouth daily. (started in February 2021)Previous therapy: MTX 4 tablets by mouth once weekly and folic acid 1 mg po qd (d/c January 2021).  CBC and CMP updated on 11/11/22.  Her next lab work will be due in November and every 3 months to monitor for drug toxicity. Discussed the importance of holding imuran if she develops signs or symptoms of an infection and to resume once the infection has completely cleared.  She has received the annual flu shot.  Sjogren's syndrome with other organ involvement (HCC) - ANA 1: 1280 speckled, positive Ro antibody: She is not currently experiencing any sicca symptoms.  No parotid swelling or tenderness.  Discussed the importance of dental exams biannually as well as ophthalmology visits at least on a yearly basis.  Discussed the increased risk for developing lymphoma in patients with Sjogren's syndrome.  IFE was normal on 07/28/2022.  Raynaud's syndrome without gangrene: Not currently symptomatic.  No digital ulcerations or signs of gangrene.  ILD (interstitial lung disease) (HCC) - Followed by Dr. Marchelle Gearing.  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   PFTs updated on 08/24/2022. Evaluated by Dr. Marchelle Gearing on 08/26/22.   Recommended continuing Imuran as prescribed.  No indication for antifibrotic agent given that symptoms are stable.  Recommended repeating PFTs in 8 months.    Other medical conditions are listed as follows:  History of hyperlipidemia  Vitamin D deficiency  Thyroid goiter  Gastroesophageal reflux disease without esophagitis  History of hypothyroidism  Dysplasia of cervix, low grade (CIN 1 and 2)  Positive ANA (antinuclear antibody)  Elevated LFTs: AST and ALT WNL on 11/11/22.   Chronic left SI joint pain: Not currently symptomatic.   Trochanteric bursitis, left XBJ:[id number]   H/O: pneumonia  Orders: No orders of the defined types were placed in  this encounter.  No orders of the defined types were placed in this encounter.   Follow-Up Instructions: Return in about 5 months (around 05/30/2023) for Rheumatoid arthritis, Sjogren's syndrome.   Gearldine Bienenstock, PA-C  Note - This record has been created using Dragon software.  Chart creation errors have been sought, but may not always  have been located. Such creation errors do not reflect on  the standard of medical  care.

## 2022-12-30 ENCOUNTER — Ambulatory Visit: Payer: 59 | Attending: Physician Assistant | Admitting: Physician Assistant

## 2022-12-30 ENCOUNTER — Encounter: Payer: Self-pay | Admitting: Physician Assistant

## 2022-12-30 VITALS — BP 133/88 | HR 61 | Resp 12 | Ht 61.0 in | Wt 147.0 lb

## 2022-12-30 DIAGNOSIS — R768 Other specified abnormal immunological findings in serum: Secondary | ICD-10-CM

## 2022-12-30 DIAGNOSIS — I7300 Raynaud's syndrome without gangrene: Secondary | ICD-10-CM

## 2022-12-30 DIAGNOSIS — K219 Gastro-esophageal reflux disease without esophagitis: Secondary | ICD-10-CM

## 2022-12-30 DIAGNOSIS — Z8701 Personal history of pneumonia (recurrent): Secondary | ICD-10-CM

## 2022-12-30 DIAGNOSIS — Z79899 Other long term (current) drug therapy: Secondary | ICD-10-CM

## 2022-12-30 DIAGNOSIS — N870 Mild cervical dysplasia: Secondary | ICD-10-CM

## 2022-12-30 DIAGNOSIS — Z8639 Personal history of other endocrine, nutritional and metabolic disease: Secondary | ICD-10-CM

## 2022-12-30 DIAGNOSIS — M533 Sacrococcygeal disorders, not elsewhere classified: Secondary | ICD-10-CM

## 2022-12-30 DIAGNOSIS — E559 Vitamin D deficiency, unspecified: Secondary | ICD-10-CM

## 2022-12-30 DIAGNOSIS — M0579 Rheumatoid arthritis with rheumatoid factor of multiple sites without organ or systems involvement: Secondary | ICD-10-CM

## 2022-12-30 DIAGNOSIS — R7989 Other specified abnormal findings of blood chemistry: Secondary | ICD-10-CM

## 2022-12-30 DIAGNOSIS — J849 Interstitial pulmonary disease, unspecified: Secondary | ICD-10-CM

## 2022-12-30 DIAGNOSIS — M7062 Trochanteric bursitis, left hip: Secondary | ICD-10-CM

## 2022-12-30 DIAGNOSIS — M3509 Sicca syndrome with other organ involvement: Secondary | ICD-10-CM | POA: Diagnosis not present

## 2022-12-30 DIAGNOSIS — G8929 Other chronic pain: Secondary | ICD-10-CM

## 2022-12-30 DIAGNOSIS — R7689 Other specified abnormal immunological findings in serum: Secondary | ICD-10-CM

## 2022-12-30 DIAGNOSIS — E049 Nontoxic goiter, unspecified: Secondary | ICD-10-CM

## 2022-12-30 NOTE — Patient Instructions (Signed)
Standing Labs We placed an order today for your standing lab work.   Please have your standing labs drawn at end of November and every 3 months   Please have your labs drawn 2 weeks prior to your appointment so that the provider can discuss your lab results at your appointment, if possible.  Please note that you may see your imaging and lab results in MyChart before we have reviewed them. We will contact you once all results are reviewed. Please allow our office up to 72 hours to thoroughly review all of the results before contacting the office for clarification of your results.  WALK-IN LAB HOURS  Monday through Thursday from 8:00 am -12:30 pm and 1:00 pm-5:00 pm and Friday from 8:00 am-12:00 pm.  Patients with office visits requiring labs will be seen before walk-in labs.  You may encounter longer than normal wait times. Please allow additional time. Wait times may be shorter on  Monday and Thursday afternoons.  We do not book appointments for walk-in labs. We appreciate your patience and understanding with our staff.   Labs are drawn by [facility]. Please bring your co-pay at the time of your lab draw.  You may receive a bill from [facility] for your lab work.  Please note if you are on Hydroxychloroquine and and an order has been placed for a Hydroxychloroquine level,  you will need to have it drawn 4 hours or more after your last dose.  If you wish to have your labs drawn at another location, please call the office 24 hours in advance so we can fax the orders.  The office is located at [address], [address], [place], [place] [address]   If you have any questions regarding directions or hours of operation,  please call [phone].   As a reminder, please drink plenty of water prior to coming for your lab work. Thanks!

## 2023-01-07 ENCOUNTER — Ambulatory Visit: Payer: 59 | Admitting: Dietician

## 2023-01-21 ENCOUNTER — Encounter: Payer: 59 | Attending: Family Medicine | Admitting: Dietician

## 2023-01-21 ENCOUNTER — Encounter: Payer: Self-pay | Admitting: Dietician

## 2023-01-21 VITALS — Wt 149.0 lb

## 2023-01-21 DIAGNOSIS — I10 Essential (primary) hypertension: Secondary | ICD-10-CM | POA: Insufficient documentation

## 2023-01-21 NOTE — Progress Notes (Signed)
Medical Nutrition Therapy  Appointment Start time:  [phone]  Appointment End time:  0815  Primary concerns today: questions about hypertension and processed food   Referral diagnosis: Employee visit 3, no referral diagnosis.  Preferred learning style: no preference indicated Learning readiness: ready   NUTRITION ASSESSMENT   Anthropometrics  Wt: 01/21/23: 149 lb Wt: 11/30/22: 147 lb Wt: 11/04/22: 145 lb 11.2 oz  Clinical Medical Hx: arthritis, HTN, thyroid disease, rheumatoid arthritis Medications: levothyroxine Labs: 09/01/22 hemoglobin 10.8,  Notable Signs/Symptoms: none reported Food Allergies: fruit   Lifestyle & Dietary Hx  Pt states things have been on and off with her goals. She states some weeks she has been doing good with 40 minutes of elliptical/walking and only having sweets 2 times, but some weeks she only does 20 minutes and may have sweets 4-5 times.  Pt feels like she may not be getting a lot of variety in her diet.   Pt states she tried the tabitha brown salt-free seasoning mix and liked it. Pt states she tried kale crunch salad from [employer] and enjoyed it.  Pt is curious about gluten free food products and what that means.    Estimated daily fluid intake: 64+ oz Supplements: probiotic, turmeric, MVI Sleep: 10pm-6am, 7-8 hours.  Stress / self-care: pt states stress isn't bad.  Current average weekly physical activity: walking or elliptical 3-4 times per week for 20-40 minutes  24-Hr Dietary Recall First Meal: boiled or scrambled eggs on whole wheat toast Snack: apple or orange Second Meal: chicken with cabbage and roasted potatoes Snack: peanuts OR protein shake Third Meal: chicken/turkey/beef/salmon and starch and vegetables Snack: none Beverages: water, coffee 2-3 c per week    NUTRITION DIAGNOSIS  NB-1.1 Food and nutrition-related knowledge deficit As related to lack of prior nutrition education by a nutrition professional.  As evidenced by pt  report.   NUTRITION INTERVENTION  Nutrition education (E-1) on the following topics:   Hypertension Nutrition Therapy: -Limiting Sodium Even if you are taking medications for your blood pressure, you should still limit how much sodium you consume. In general, people with high blood pressure should get between 1,500 milligrams (mg) and 2,300 mg sodium per day. -Tips to Cut Sodium In general, foods with more than 300 mg sodium per serving may not fit into your meal plan. You can find out how much sodium is in a food by reading the food label. Remember the amount listed is for one serving, which may be more or less than you eat.) Do not salt food at the table; add very little when cooking. Choose carefully when you eat away from home. Restaurant foods can be very high in sodium. Let the person taking your order know that you are looking for low-salt or no-salt choices. Many restaurants have special menus or will make food with less salt. -Fats Eating the right types of fat and avoiding the unhealthy ones helps to reduce the buildup of plaque in your blood vessels. This lowers your risk for strokes or heart attacks.  Discussed nutrient label reading for sodium.   Processed foods: Pt is encouraged to consume less processed foods, as they have the potential to negatively affect overall health. Processed foods often destroy or remove nutrients from the product and/or have added salt, sugar, and saturated fats. Although not all processed foods are a concern, it is advised to have the majority of our foods be unprocessed or minimally processed as opposed to processed and ultra-processed. An example of this would  be a whole apple (unprocessed), prepackaged apple slices with no additives (minimally processed), unsweetened applesauce (processed), and sweetened applesauce or apple juice with high fructose corn syrup (ultra-processed).   Discussed benefits of resistance/strength training on bone health and  muscle strength. Encouraged pt to implement 2 days per week of resistance training.   Discussed what 'gluten-free' means on a nutrient label, gluten-containing foods, and celiac disease. Pt has never had issues with tolerating gluten, so encouraged pt to continue consuming whole wheat products when desired.   Handouts Provided Include  Plate Method (initial appt).  Balanced Snacks Sheet  Learning Style & Readiness for Change Teaching method utilized: Visual & Auditory  Demonstrated degree of understanding via: Teach Back  Barriers to learning/adherence to lifestyle change: none  Goals Established by Pt  New Goal: add some strength/resistance training 2 times a week for 15 minutes.   Pt would like to continue previous goals:   Goal: limit sweet intake to 2 times per week. - in progress, continue.  Goal: increase walking from 20 min to 40 min, continue doing 3-4 times per week. - in progress, continue.    MONITORING & EVALUATION Dietary intake, weekly physical activity, and follow up PRN.  Next Steps  Patient is to call for questions.

## 2023-02-15 ENCOUNTER — Other Ambulatory Visit: Payer: Self-pay | Admitting: *Deleted

## 2023-02-15 ENCOUNTER — Other Ambulatory Visit: Payer: Self-pay | Admitting: Rheumatology

## 2023-02-15 DIAGNOSIS — Z79899 Other long term (current) drug therapy: Secondary | ICD-10-CM

## 2023-02-15 LAB — HM MAMMOGRAPHY

## 2023-02-15 NOTE — Telephone Encounter (Signed)
Last Fill: 11/11/2022  Labs: 11/11/2022 Globulin remains slightly elevated. Rest of CMP WNL Anemia remains stable.  Next Visit: 06/07/2022  Last Visit: 12/30/2022  DX: Rheumatoid arthritis involving multiple sites with positive rheumatoid factor   Current Dose per office note 12/30/2022: Imuran 75 mg by mouth daily.   Patient update labs in office today.   Okay to refill Imuran?

## 2023-02-16 LAB — CBC WITH DIFFERENTIAL/PLATELET
Absolute Lymphocytes: 1358 {cells}/uL (ref 850–3900)
Absolute Monocytes: 385 {cells}/uL (ref 200–950)
Basophils Absolute: 30 {cells}/uL (ref 0–200)
Basophils Relative: 0.8 %
Eosinophils Absolute: 222 {cells}/uL (ref 15–500)
Eosinophils Relative: 6 %
HCT: 35.8 % (ref 35.0–45.0)
Hemoglobin: 11.3 g/dL — ABNORMAL LOW (ref 11.7–15.5)
MCH: 27.8 pg (ref 27.0–33.0)
MCHC: 31.6 g/dL — ABNORMAL LOW (ref 32.0–36.0)
MCV: 88 fL (ref 80.0–100.0)
MPV: 10.8 fL (ref 7.5–12.5)
Monocytes Relative: 10.4 %
Neutro Abs: 1706 {cells}/uL (ref 1500–7800)
Neutrophils Relative %: 46.1 %
Platelets: 288 10*3/uL (ref 140–400)
RBC: 4.07 10*6/uL (ref 3.80–5.10)
RDW: 13.4 % (ref 11.0–15.0)
Total Lymphocyte: 36.7 %
WBC: 3.7 10*3/uL — ABNORMAL LOW (ref 3.8–10.8)

## 2023-02-16 LAB — COMPLETE METABOLIC PANEL WITH GFR
AG Ratio: 1.1 (calc) (ref 1.0–2.5)
ALT: 8 U/L (ref 6–29)
AST: 25 U/L (ref 10–35)
Albumin: 4.4 g/dL (ref 3.6–5.1)
Alkaline phosphatase (APISO): 49 U/L (ref 31–125)
BUN: 12 mg/dL (ref 7–25)
CO2: 30 mmol/L (ref 20–32)
Calcium: 9.7 mg/dL (ref 8.6–10.2)
Chloride: 104 mmol/L (ref 98–110)
Creat: 0.72 mg/dL (ref 0.50–0.99)
Globulin: 4.1 g/dL — ABNORMAL HIGH (ref 1.9–3.7)
Glucose, Bld: 93 mg/dL (ref 65–99)
Potassium: 3.9 mmol/L (ref 3.5–5.3)
Sodium: 140 mmol/L (ref 135–146)
Total Bilirubin: 0.4 mg/dL (ref 0.2–1.2)
Total Protein: 8.5 g/dL — ABNORMAL HIGH (ref 6.1–8.1)
eGFR: 103 mL/min/{1.73_m2} (ref >=60)

## 2023-02-16 NOTE — Progress Notes (Signed)
Protein is mildly elevated and stable.  Hemoglobin is low and stable.  White cell count is low due to immunosuppression.  We will continue to monitor labs.

## 2023-02-28 ENCOUNTER — Ambulatory Visit (INDEPENDENT_AMBULATORY_CARE_PROVIDER_SITE_OTHER): Payer: 59 | Admitting: Family Medicine

## 2023-02-28 ENCOUNTER — Encounter: Payer: Self-pay | Admitting: Family Medicine

## 2023-02-28 VITALS — BP 100/72 | HR 73 | Temp 98.0°F | Ht 61.0 in | Wt 146.2 lb

## 2023-02-28 DIAGNOSIS — E782 Mixed hyperlipidemia: Secondary | ICD-10-CM | POA: Diagnosis not present

## 2023-02-28 DIAGNOSIS — Z0000 Encounter for general adult medical examination without abnormal findings: Secondary | ICD-10-CM | POA: Diagnosis not present

## 2023-02-28 DIAGNOSIS — I10 Essential (primary) hypertension: Secondary | ICD-10-CM | POA: Diagnosis not present

## 2023-02-28 DIAGNOSIS — E039 Hypothyroidism, unspecified: Secondary | ICD-10-CM

## 2023-02-28 LAB — HEMOGLOBIN A1C: Hgb A1c MFr Bld: 6.5 % (ref 4.6–6.5)

## 2023-02-28 LAB — COMPREHENSIVE METABOLIC PANEL
ALT: 12 U/L (ref 0–35)
AST: 30 U/L (ref 0–37)
Albumin: 4.3 g/dL (ref 3.5–5.2)
Alkaline Phosphatase: 44 U/L (ref 39–117)
BUN: 14 mg/dL (ref 6–23)
CO2: 30 meq/L (ref 19–32)
Calcium: 9.4 mg/dL (ref 8.4–10.5)
Chloride: 104 meq/L (ref 96–112)
Creatinine, Ser: 0.71 mg/dL (ref 0.40–1.20)
GFR: 100.23 mL/min (ref >60.00)
Glucose, Bld: 96 mg/dL (ref 70–99)
Potassium: 3.7 meq/L (ref 3.5–5.1)
Sodium: 141 meq/L (ref 135–145)
Total Bilirubin: 0.6 mg/dL (ref 0.2–1.2)
Total Protein: 7.8 g/dL (ref 6.0–8.3)

## 2023-02-28 LAB — LIPID PANEL
Cholesterol: 182 mg/dL (ref 0–200)
HDL: 62.6 mg/dL (ref >39.00)
LDL Cholesterol: 111 mg/dL — ABNORMAL HIGH (ref 0–99)
NonHDL: 119.87
Total CHOL/HDL Ratio: 3
Triglycerides: 46 mg/dL (ref 0.0–149.0)
VLDL: 9.2 mg/dL (ref 0.0–40.0)

## 2023-02-28 LAB — TSH: TSH: 2.4 u[IU]/mL (ref 0.35–5.50)

## 2023-02-28 NOTE — Progress Notes (Signed)
Established Patient Office Visit   Subjective  Patient ID: [name], female    DOB: [birth year]  Age: 48 y.o. MRN: [id number]  Chief Complaint  Patient presents with   Annual Exam    Patient is a 47 year old female seen for CPE.  Patient states she is doing well overall.  Had mammogram and Pap at the beginning of December.  Patient followed by rheumatology and had labs a few weeks ago for RA.  White count slightly low due to immunosuppression, on Imuran.  Also seen by cardiology.    Patient Active Problem List   Diagnosis Date Noted   Essential hypertension 09/06/2022   Abnormal cervical Papanicolaou smear 07/08/2020   Cyst of ovary 07/08/2020   Encounter for therapeutic drug level monitoring 07/08/2020   Hypothyroidism 07/08/2020   Irregular periods 07/08/2020   Menopause 07/08/2020   Mixed hyperlipidemia 07/08/2020   Oligomenorrhea 07/08/2020   Pain in pelvis 07/08/2020   Stenosis of cervix 07/08/2020   Vaginal high risk human papillomavirus (HPV) DNA test positive 07/08/2020   Vitamin D deficiency 07/08/2020   ILD (interstitial lung disease) (HCC) 02/20/2019   Restrictive lung disease 10/09/2018   RA (rheumatoid arthritis) (HCC) 09/07/2018   H/O: pneumonia    Dysplasia of cervix, low grade (CIN 1 and 2)    Past Medical History:  Diagnosis Date   Abnormal Pap smear    Allergy    Dysplasia of cervix, low grade (CIN 1)    Essential hypertension 09/06/2022   H/O: pneumonia    Hypothyroidism    Rheumatoid arthritis (HCC)    Past Surgical History:  Procedure Laterality Date   CESAREAN SECTION     LAPAROSCOPIC TUBAL LIGATION  02/01/2011   Procedure: LAPAROSCOPIC TUBAL LIGATION;  Surgeon: Purcell Nails, MD;  Location: [facility];  Service: Gynecology;  Laterality: Bilateral;   LEEP     TUBAL LIGATION     Social History   Tobacco Use   Smoking status: Never    Passive exposure: Past   Smokeless tobacco: Never  Vaping Use   Vaping status: Never Used   Substance Use Topics   Alcohol use: No   Drug use: No   Family History  Problem Relation Age of Onset   Hypertension Mother    Diabetes Mother    Stroke Mother    Heart attack Father 38 - 37   Hypertension Father    Rheum arthritis Sister    Hypertension Brother    Healthy Daughter    Healthy Son    Rheum arthritis Maternal Aunt    Rheum arthritis Maternal Aunt    Allergies  Allergen Reactions   Other     Cats Fruit       ROS Negative unless stated above    Objective:     BP 100/72 (BP Location: Right Arm, Patient Position: Sitting, Cuff Size: Normal)   Pulse 73   Temp 98 F (36.7 C) (Oral)   Ht 5\' 1"  (1.549 m)   Wt 146 lb 3.2 oz (66.3 kg)   LMP 08/26/2013   SpO2 97%   BMI 27.62 kg/m  BP Readings from Last 3 Encounters:  02/28/23 100/72  12/30/22 133/88  12/16/22 128/87   Wt Readings from Last 3 Encounters:  02/28/23 146 lb 3.2 oz (66.3 kg)  01/21/23 149 lb (67.6 kg)  12/30/22 147 lb (66.7 kg)      Physical Exam Constitutional:      Appearance: Normal appearance.  HENT:  Head: Normocephalic and atraumatic.     Right Ear: Tympanic membrane, ear canal and external ear normal.     Left Ear: Tympanic membrane, ear canal and external ear normal.     Nose: Nose normal.     Mouth/Throat:     Mouth: Mucous membranes are moist.     Pharynx: No oropharyngeal exudate or posterior oropharyngeal erythema.  Eyes:     General: No scleral icterus.    Extraocular Movements: Extraocular movements intact.     Conjunctiva/sclera: Conjunctivae normal.     Pupils: Pupils are equal, round, and reactive to light.  Neck:     Thyroid: No thyromegaly.  Cardiovascular:     Rate and Rhythm: Normal rate and regular rhythm.     Pulses: Normal pulses.     Heart sounds: Normal heart sounds. No murmur heard.    No friction rub.  Pulmonary:     Effort: Pulmonary effort is normal.     Breath sounds: Normal breath sounds. No wheezing, rhonchi or rales.  Abdominal:      General: Bowel sounds are normal.     Palpations: Abdomen is soft.     Tenderness: There is no abdominal tenderness.  Musculoskeletal:        General: No deformity. Normal range of motion.  Lymphadenopathy:     Cervical: No cervical adenopathy.  Skin:    General: Skin is warm and dry.     Findings: No lesion.  Neurological:     General: No focal deficit present.     Mental Status: She is alert and oriented to person, place, and time.  Psychiatric:        Mood and Affect: Mood normal.        Thought Content: Thought content normal.      No results found for any visits on 02/28/23.    Assessment & Plan:  Well adult exam -     Hemoglobin A1c; Future  Essential hypertension -     Comprehensive metabolic panel; Future  Mixed hyperlipidemia -     Comprehensive metabolic panel; Future -     Lipid panel; Future  Acquired hypothyroidism -     TSH; Future  Age-appropriate health screenings discussed.  Will obtain labs.  Labs recently done not repeated immunizations reviewed.  Patient plans to have COVID booster at [facility].  Colonoscopy up-to-date.  Pap and mammogram done 02/2023.  Continue Synthroid 50 mcg daily.  Adjust if needed based on lab results.  Continue lifestyle modifications.  Continue Norvasc 2.5 mg and lifestyle modifications.  No follow-ups on file.   Deeann Saint, MD

## 2023-03-13 ENCOUNTER — Encounter: Payer: Self-pay | Admitting: Family Medicine

## 2023-03-23 NOTE — Telephone Encounter (Signed)
 Unfortunately I do not have any other records of prior hemoglobin A1c readings.

## 2023-05-02 ENCOUNTER — Other Ambulatory Visit: Payer: Self-pay | Admitting: Family Medicine

## 2023-05-02 DIAGNOSIS — E039 Hypothyroidism, unspecified: Secondary | ICD-10-CM

## 2023-05-15 ENCOUNTER — Other Ambulatory Visit: Payer: Self-pay | Admitting: Rheumatology

## 2023-05-16 NOTE — Telephone Encounter (Signed)
 Last Fill: 02/16/2023  Labs: 02/15/2023 Protein is mildly elevated and stable.  Hemoglobin is low and stable.  White cell count is low due to immunosuppression.     Next Visit: 06/07/2023  Last Visit: 12/30/2022  DX:  Rheumatoid arthritis involving multiple sites with positive rheumatoid factor    Current Dose per office note 12/30/2022: Imuran 75 mg by mouth daily.   Patient to update labs at upcoming appointment on 06/07/2023  Okay to refill Imuran?

## 2023-05-19 ENCOUNTER — Other Ambulatory Visit: Payer: Self-pay | Admitting: *Deleted

## 2023-05-19 DIAGNOSIS — Z79899 Other long term (current) drug therapy: Secondary | ICD-10-CM

## 2023-05-20 LAB — CBC WITH DIFFERENTIAL/PLATELET
Absolute Lymphocytes: 1660 {cells}/uL (ref 850–3900)
Absolute Monocytes: 400 {cells}/uL (ref 200–950)
Basophils Absolute: 32 {cells}/uL (ref 0–200)
Basophils Relative: 0.8 %
Eosinophils Absolute: 192 {cells}/uL (ref 15–500)
Eosinophils Relative: 4.8 %
HCT: 36.6 % (ref 35.0–45.0)
Hemoglobin: 11.4 g/dL — ABNORMAL LOW (ref 11.7–15.5)
MCH: 27.9 pg (ref 27.0–33.0)
MCHC: 31.1 g/dL — ABNORMAL LOW (ref 32.0–36.0)
MCV: 89.5 fL (ref 80.0–100.0)
MPV: 10.9 fL (ref 7.5–12.5)
Monocytes Relative: 10 %
Neutro Abs: 1716 {cells}/uL (ref 1500–7800)
Neutrophils Relative %: 42.9 %
Platelets: 275 10*3/uL (ref 140–400)
RBC: 4.09 10*6/uL (ref 3.80–5.10)
RDW: 13.5 % (ref 11.0–15.0)
Total Lymphocyte: 41.5 %
WBC: 4 10*3/uL (ref 3.8–10.8)

## 2023-05-20 LAB — COMPLETE METABOLIC PANEL WITH GFR
AG Ratio: 1.2 (calc) (ref 1.0–2.5)
ALT: 12 U/L (ref 6–29)
AST: 25 U/L (ref 10–35)
Albumin: 4.6 g/dL (ref 3.6–5.1)
Alkaline phosphatase (APISO): 41 U/L (ref 31–125)
BUN: 12 mg/dL (ref 7–25)
CO2: 32 mmol/L (ref 20–32)
Calcium: 10.2 mg/dL (ref 8.6–10.2)
Chloride: 103 mmol/L (ref 98–110)
Creat: 0.68 mg/dL (ref 0.50–0.99)
Globulin: 3.8 g/dL — ABNORMAL HIGH (ref 1.9–3.7)
Glucose, Bld: 94 mg/dL (ref 65–99)
Potassium: 3.9 mmol/L (ref 3.5–5.3)
Sodium: 141 mmol/L (ref 135–146)
Total Bilirubin: 0.4 mg/dL (ref 0.2–1.2)
Total Protein: 8.4 g/dL — ABNORMAL HIGH (ref 6.1–8.1)
eGFR: 107 mL/min/{1.73_m2} (ref >=60)

## 2023-05-20 NOTE — Progress Notes (Signed)
 CBC and CMP are stable.  Hemoglobin remains low.  Patient should take multivitamin with iron.

## 2023-05-25 NOTE — Progress Notes (Signed)
 Office Visit Note  Patient: [name]             Date of Birth: Oct 08, 1975           MRN: [id number]             PCP: Deeann Saint, MD Referring: Deeann Saint, MD Visit Date: 06/07/2023 Occupation: @GUAROCC @  Subjective:  Medication management  History of Present Illness: [name] is a 48 y.o. female with seropositive rheumatoid arthritis, Sjogren's and ILD.  She denies having any increased joint pain or joint swelling.  She has not had any flares of rheumatoid arthritis since the last visit.  She continues to be on Imuran 75 mg p.o. daily.  She denies any increased shortness of breath.  She is trying to schedule a follow-up visit with Dr. Marchelle Gearing.  She was evaluated by Dr. Colletta Maryland in June 2024.  According to his note based on the PFTs and high-resolution CT her disease was stable.  No changes were advised.  Last high-resolution CT was in February 2022.    Activities of Daily Living:  Patient reports morning stiffness for 0 minutes.   Patient Denies nocturnal pain.  Difficulty dressing/grooming: Denies Difficulty climbing stairs: Denies Difficulty getting out of chair: Denies Difficulty using hands for taps, buttons, cutlery, and/or writing: Denies  Review of Systems  Constitutional:  Negative for fatigue.  HENT:  Positive for mouth dryness. Negative for mouth sores and nose dryness.   Eyes:  Negative for pain and dryness.  Respiratory:  Negative for shortness of breath.   Cardiovascular:  Negative for chest pain and palpitations.  Gastrointestinal:  Negative for blood in stool, constipation and diarrhea.  Endocrine: Negative for increased urination.  Genitourinary:  Negative for involuntary urination.  Musculoskeletal:  Negative for joint pain, gait problem, joint pain, joint swelling, myalgias, muscle weakness, morning stiffness, muscle tenderness and myalgias.  Skin:  Negative for color change, rash, hair loss and sensitivity to sunlight.   Allergic/Immunologic: Negative for susceptible to infections.  Neurological:  Negative for dizziness and headaches.  Hematological:  Negative for swollen glands.  Psychiatric/Behavioral:  Negative for depressed mood and sleep disturbance. The patient is not nervous/anxious.     PMFS History:  Patient Active Problem List   Diagnosis Date Noted   Positive ANA (antinuclear antibody) 06/07/2023   Essential hypertension 09/06/2022   Abnormal cervical Papanicolaou smear 07/08/2020   Cyst of ovary 07/08/2020   Encounter for therapeutic drug level monitoring 07/08/2020   Hypothyroidism 07/08/2020   Irregular periods 07/08/2020   Menopause 07/08/2020   Mixed hyperlipidemia 07/08/2020   Oligomenorrhea 07/08/2020   Pain in pelvis 07/08/2020   Stenosis of cervix 07/08/2020   Vaginal high risk human papillomavirus (HPV) DNA test positive 07/08/2020   Vitamin D deficiency 07/08/2020   ILD (interstitial lung disease) (HCC) 02/20/2019   Restrictive lung disease 10/09/2018   RA (rheumatoid arthritis) (HCC) 09/07/2018   H/O: pneumonia    Dysplasia of cervix, low grade (CIN 1 and 2)     Past Medical History:  Diagnosis Date   Abnormal Pap smear    Allergy    Dysplasia of cervix, low grade (CIN 1)    Essential hypertension 09/06/2022   H/O: pneumonia    Hypothyroidism    Rheumatoid arthritis (HCC)     Family History  Problem Relation Age of Onset   Hypertension Mother    Diabetes Mother    Stroke Mother    Heart attack Father 53 -  69   Hypertension Father    Rheum arthritis Sister    Hypertension Brother    Healthy Daughter    Healthy Son    Rheum arthritis Maternal Aunt    Rheum arthritis Maternal Aunt    Past Surgical History:  Procedure Laterality Date   CESAREAN SECTION     LAPAROSCOPIC TUBAL LIGATION  02/01/2011   Procedure: LAPAROSCOPIC TUBAL LIGATION;  Surgeon: Purcell Nails, MD;  Location: [facility];  Service: Gynecology;  Laterality: Bilateral;   LEEP     TUBAL  LIGATION     Social History   Social History Narrative   Not on file   Immunization History  Administered Date(s) Administered   Influenza Split 12/19/2012, 03/05/2014, 11/24/2015, 11/28/2017, 12/28/2019, 12/31/2020   Influenza,inj,Quad PF,6+ Mos 12/29/2018   Influenza,inj,quad, With Preservative 01/10/2015   Influenza-Unspecified 12/29/2021, 12/28/2022   PFIZER(Purple Top)SARS-COV-2 Vaccination 05/25/2019, 06/15/2019, 02/01/2020   Td 08/14/2003, 07/02/2015   Tdap 03/05/2014     Objective: Vital Signs: BP (!) 138/92 (BP Location: Left Arm, Patient Position: Sitting, Cuff Size: Normal)   Pulse 71   Resp 15   Ht 5\' 1"  (1.549 m)   Wt 152 lb 12.8 oz (69.3 kg)   LMP 08/26/2013   BMI 28.87 kg/m    Physical Exam Vitals and nursing note reviewed.  Constitutional:      Appearance: She is well-developed.  HENT:     Head: Normocephalic and atraumatic.  Eyes:     Conjunctiva/sclera: Conjunctivae normal.  Cardiovascular:     Rate and Rhythm: Normal rate and regular rhythm.     Heart sounds: Normal heart sounds.  Pulmonary:     Effort: Pulmonary effort is normal.     Breath sounds: Normal breath sounds.  Abdominal:     General: Bowel sounds are normal.     Palpations: Abdomen is soft.  Musculoskeletal:     Cervical back: Normal range of motion.  Lymphadenopathy:     Cervical: No cervical adenopathy.  Skin:    General: Skin is warm and dry.     Capillary Refill: Capillary refill takes less than 2 seconds.  Neurological:     Mental Status: She is alert and oriented to person, place, and time.  Psychiatric:        Behavior: Behavior normal.      Musculoskeletal Exam: Cervical, thoracic and lumbar spine were in good range of motion.  Shoulders, elbows, wrists, MCPs, PIPs and DIPs were in good range of motion without synovitis.  Hips, knees, ankles, MTPs were in good range of motion with no synovitis.  CDAI Exam: CDAI Score: -- Patient Global: 0 / 100; Provider Global: 0 /  100 Swollen: --; Tender: -- Joint Exam 06/07/2023   No joint exam has been documented for this visit   There is currently no information documented on the homunculus. Go to the Rheumatology activity and complete the homunculus joint exam.  Investigation: No additional findings.  Imaging: No results found.  Recent Labs: Lab Results  Component Value Date   WBC 4.0 05/19/2023   HGB 11.4 (L) 05/19/2023   PLT 275 05/19/2023   NA 141 05/19/2023   K 3.9 05/19/2023   CL 103 05/19/2023   CO2 32 05/19/2023   GLUCOSE 94 05/19/2023   BUN 12 05/19/2023   CREATININE 0.68 05/19/2023   BILITOT 0.4 05/19/2023   ALKPHOS 44 02/28/2023   AST 25 05/19/2023   ALT 12 05/19/2023   PROT 8.4 (H) 05/19/2023   ALBUMIN 4.3 02/28/2023  CALCIUM 10.2 05/19/2023   GFRAA 122 06/12/2020   QFTBGOLDPLUS NEGATIVE 03/07/2019    Speciality Comments: Treated with methotrexate (December 2019-February 2021)which was stopped due to shortness of breath. Imuran and started in February 2021  Procedures:  No procedures performed Allergies: Other   Assessment / Plan:     Visit Diagnoses: Rheumatoid arthritis involving multiple sites with positive rheumatoid factor (HCC) - Positive RF, +14 3 3  eta, +ANA and elevated ESR.  Diagnosed in 2019. MTX(12/19-02/21)-dcd after shortness of breath: Patient had no synovitis on the examination today.  She denies having a rheumatoid arthritis flare since the last visit.  She has been tolerating Humira without any side effects.  High risk medication use - Imuran 75 mg by mouth daily.  Number 08/2023 CBC showed hemoglobin of 11.4 and CMP was normal.  She was advised to get labs every 3 months.  (started in February 2021)Previous therapy: MTX 4 tablets by mouth once weekly and folic acid 1 mg po qd (d/c January 2021).  Information immunization was placed in the AVS.  She was advised to hold Imuran if she develops a severe infection.  Sjogren's syndrome with other organ involvement  (HCC) - ANA 1: 1280 speckled, positive Ro antibody: She continues to have dry mouth symptoms.  She states her symptoms are manageable with over-the-counter products.  No parotid swelling was noted.  Increased risk of ILD and lymphoma with Sjogren's was discussed.  Raynaud's syndrome without gangrene-currently not traumatic.  ILD (interstitial lung disease) (HCC) - Followed by Dr. Marchelle Gearing.  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  PFTs updated on 08/24/2022.  Patient will schedule a follow-up visit with Dr. Marchelle Gearing.  History of hyperlipidemia - February 28, 2023 LDL 111  Thyroid goiter  Gastroesophageal reflux disease without esophagitis  History of hypothyroidism  Dysplasia of cervix, low grade (CIN 1 and 2)-followed by GYN.  Orders: No orders of the defined types were placed in this encounter.  No orders of the defined types were placed in this encounter.    Follow-Up Instructions: Return in about 5 months (around 11/07/2023) for RA, ILD.   Pollyann Savoy, MD  Note - This record has been created using Animal nutritionist.  Chart creation errors have been sought, but may not always  have been located. Such creation errors do not reflect on  the standard of medical care.

## 2023-06-07 ENCOUNTER — Ambulatory Visit: Payer: 59 | Attending: Rheumatology | Admitting: Rheumatology

## 2023-06-07 ENCOUNTER — Encounter: Payer: Self-pay | Admitting: Rheumatology

## 2023-06-07 VITALS — BP 138/92 | HR 71 | Resp 15 | Ht 61.0 in | Wt 152.8 lb

## 2023-06-07 DIAGNOSIS — M7062 Trochanteric bursitis, left hip: Secondary | ICD-10-CM

## 2023-06-07 DIAGNOSIS — N870 Mild cervical dysplasia: Secondary | ICD-10-CM

## 2023-06-07 DIAGNOSIS — M0579 Rheumatoid arthritis with rheumatoid factor of multiple sites without organ or systems involvement: Secondary | ICD-10-CM

## 2023-06-07 DIAGNOSIS — I7300 Raynaud's syndrome without gangrene: Secondary | ICD-10-CM

## 2023-06-07 DIAGNOSIS — Z79899 Other long term (current) drug therapy: Secondary | ICD-10-CM

## 2023-06-07 DIAGNOSIS — R7989 Other specified abnormal findings of blood chemistry: Secondary | ICD-10-CM

## 2023-06-07 DIAGNOSIS — E049 Nontoxic goiter, unspecified: Secondary | ICD-10-CM

## 2023-06-07 DIAGNOSIS — R768 Other specified abnormal immunological findings in serum: Secondary | ICD-10-CM | POA: Insufficient documentation

## 2023-06-07 DIAGNOSIS — Z8639 Personal history of other endocrine, nutritional and metabolic disease: Secondary | ICD-10-CM

## 2023-06-07 DIAGNOSIS — K219 Gastro-esophageal reflux disease without esophagitis: Secondary | ICD-10-CM

## 2023-06-07 DIAGNOSIS — M3509 Sicca syndrome with other organ involvement: Secondary | ICD-10-CM | POA: Diagnosis not present

## 2023-06-07 DIAGNOSIS — E559 Vitamin D deficiency, unspecified: Secondary | ICD-10-CM

## 2023-06-07 DIAGNOSIS — Z8701 Personal history of pneumonia (recurrent): Secondary | ICD-10-CM

## 2023-06-07 DIAGNOSIS — J849 Interstitial pulmonary disease, unspecified: Secondary | ICD-10-CM

## 2023-06-07 DIAGNOSIS — G8929 Other chronic pain: Secondary | ICD-10-CM

## 2023-06-07 NOTE — Patient Instructions (Signed)
Standing Labs We placed an order today for your standing lab work.   Please have your standing labs drawn in June and every 3 months  Please have your labs drawn 2 weeks prior to your appointment so that the provider can discuss your lab results at your appointment, if possible.  Please note that you may see your imaging and lab results in [facility] before we have reviewed them. We will contact you once all results are reviewed. Please allow our office up to 72 hours to thoroughly review all of the results before contacting the office for clarification of your results.  WALK-IN LAB HOURS  Monday through Thursday from 8:00 am -12:30 pm and 1:00 pm-5:00 pm and Friday from 8:00 am-12:00 pm.  Patients with office visits requiring labs will be seen before walk-in labs.  You may encounter longer than normal wait times. Please allow additional time. Wait times may be shorter on  Monday and Thursday afternoons.  We do not book appointments for walk-in labs. We appreciate your patience and understanding with our staff.   Labs are drawn by [facility]. Please bring your co-pay at the time of your lab draw.  You may receive a bill from [facility] for your lab work.  Please note if you are on Hydroxychloroquine and and an order has been placed for a Hydroxychloroquine level,  you will need to have it drawn 4 hours or more after your last dose.  If you wish to have your labs drawn at another location, please call the office 24 hours in advance so we can fax the orders.  The office is located at [address], [place], [place], [place] [address]   If you have any questions regarding directions or hours of operation,  please call [phone].   As a reminder, please drink plenty of water prior to coming for your lab work. Thanks!  Vaccines You are taking a medication(s) that can suppress your immune system.  The following immunizations are recommended: Flu annually Covid-19  RSV Td/Tdap (tetanus,  diphtheria, pertussis) every 10 years Pneumonia (Prevnar 15 then Pneumovax 23 at least 1 year apart.  Alternatively, can take Prevnar 20 without needing additional dose) Shingrix: 2 doses from 4 weeks to 6 months apart  Please check with your PCP to make sure you are up to date.   If you have signs or symptoms of an infection or start antibiotics: First, call your PCP for workup of your infection. Hold your medication through the infection, until you complete your antibiotics, and until symptoms resolve if you take the following: Injectable medication (Actemra, Benlysta, Cimzia, Cosentyx, Enbrel, Humira, Kevzara, Orencia, Remicade, Simponi, Stelara, Taltz, Tremfya) Methotrexate Leflunomide (Arava) Mycophenolate (Cellcept) Morrie Sheldon, Olumiant, or Rinvoq

## 2023-06-23 ENCOUNTER — Ambulatory Visit: Admitting: Internal Medicine

## 2023-06-23 ENCOUNTER — Ambulatory Visit (HOSPITAL_BASED_OUTPATIENT_CLINIC_OR_DEPARTMENT_OTHER): Admitting: Internal Medicine

## 2023-06-23 ENCOUNTER — Encounter: Payer: Self-pay | Admitting: Internal Medicine

## 2023-06-23 VITALS — BP 122/73 | HR 78 | Temp 98.2°F | Ht 61.0 in | Wt 149.8 lb

## 2023-06-23 DIAGNOSIS — J849 Interstitial pulmonary disease, unspecified: Secondary | ICD-10-CM | POA: Diagnosis not present

## 2023-06-23 DIAGNOSIS — Z79899 Other long term (current) drug therapy: Secondary | ICD-10-CM

## 2023-06-23 LAB — PULMONARY FUNCTION TEST
DL/VA % pred: 139 %
DL/VA: 6.18 ml/min/mmHg/L
DLCO cor % pred: 80 %
DLCO cor: 15.24 ml/min/mmHg
DLCO unc % pred: 74 %
DLCO unc: 14.21 ml/min/mmHg
FEF 25-75 Pre: 2.24 L/s
FEF2575-%Pred-Pre: 85 %
FEV1-%Pred-Pre: 61 %
FEV1-Pre: 1.56 L
FEV1FVC-%Pred-Pre: 109 %
FEV6-%Pred-Pre: 55 %
FEV6-Pre: 1.73 L
FEV6FVC-%Pred-Pre: 102 %
FVC-%Pred-Pre: 55 %
FVC-Pre: 1.78 L
Pre FEV1/FVC ratio: 88 %
Pre FEV6/FVC Ratio: 100 %

## 2023-06-23 NOTE — Progress Notes (Signed)
 Spirometry and DLCO Performed Today.

## 2023-06-23 NOTE — Patient Instructions (Signed)
 Spirometry and DLCO Performed Today.

## 2023-06-23 NOTE — Patient Instructions (Addendum)
 ICD-10-CM   1. ILD (interstitial lung disease) (HCC)  J84.9   2. 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 Worse since 2020 but stable sine then  Plan Continue control of acid reflux  -Continue Imuran via rheumatology  -  should help with joints and also has antifibrotic effect  -No indication for nintedanib antifibrotic currently given stability.     - Monitor ILD closely; do spirometry and dlco in 9 months  Follow-up -Return in 9 months after PFT testing  = symptom score and simple walk test at followup  -  If there is evidence of continued progression then we can discuss antifibrotic therapy  -15-minute visit

## 2023-06-23 NOTE — Progress Notes (Signed)
 17/2020 --48 year old woman with RA on methotrexate, followed now for chronic cough.  She had pulmonary function testing done that showed mixed obstruction and restriction without a significant bronchodilator response.  We will treat her for reflux and her cough did improve some, did not resolve.  I tried her last time on empiric albuterol to see if she would get any benefit.  She reports that her cough is largely unchanged. She is not having dyspnea, no breakthrough GERD.  Based on the restriction on pulmonary function testing and her history of RA we obtained a CT scan of the chest 10/26/2018 that I reviewed.  This shows some mild but diffuse base-predominent groundglass change peripherally without any frank honeycombing most notable on the left.   [name], [name] > planning to do televisit Thurs, decrease MTX, possibly add a 2nd agent.   Video visit 02/20/2019 --48 year old woman with rheumatoid arthritis on methotrexate, mixed restriction and obstruction on pulmonary function testing and mild diffuse groundglass change peripherally on CT scan of the chest.  I saw her originally for chronic cough in the setting of these issues and also GERD. She has been followed by Dr Wallace Cullens with Rheumatology. She was treated with prednisone 60mg , tapering down now on 5mg  a day. Remains on MTX 10mg  once a week. Her cough has completely resolved. Breathing well.   ROV 04/26/2019 --[name] is 65 and has a history of rheumatoid arthritis on methotrexate, mixed restriction and obstruction on PFTs.  Have seen her for chronic cough and an abnormal CT scan of the chest with peripheral groundglass changes.  She is on methotrexate 10 mg once a week, was treated with prednisone 60 mg tapering down to off as of 03/21/19. Now working w Dr Titus Dubin. She started Imuran on 04/11/19.    We repeated her CT scan of the chest on 03/14/2019 and I have reviewed, this shows persistent areas of base predominant groundglass  and septal thickening, mild peripheral bronchiolectasis especially in the left base.  No frank honeycombing.  Stable compared with 10/26/2018.  There are a few scattered tiny pulmonary nodules that are stable.  OV 05/24/2019 -first visit has interstitial lung disease center.  Transfer of care from Dr. Levy Pupa to the ILD center with Dr. Marchelle Gearing.  Subjective:  Patient ID: [name], female , DOB: 06/08/1975 , age 62 y.o. , MRN: [id number] , ADDRESS: [address] [place] [address]  Rheumatologist - Dr Marti Sleigh   05/24/2019 -   Chief Complaint  Patient presents with   Follow-up    Switching from RB to MR due to ILD. Pt states she does become SOB with exertion and states she does still have a cough when she laughs. Pt denies any complaints of CP.     History is gained from talking to the patient and review of the chart.   HPI [place] D Petite 48 y.o. -carries a diagnosis of rheumatoid arthritis.  Her sister also has rheumatoid arthritis.  She was diagnosed in 2019.  Initially treated with methotrexate.  Used to see Ms. [name] physician assistant at [facility] rheumatology but has switched to Dr. Pollyann Savoy.  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 Tommi Rumps 2 days ago on 05/22/2019.  Note is that she is on 100 mg once daily of Imuran tolerating it well without any side effects.  No increase in joint pain after starting Imuran but she continues to experience stiffness and swelling in the morning for 20 to 30 minutes.  Raynard itself was not active.  In terms of her lung disease she has been seeing Dr. Delton Coombes since August 2020.  A CT scan in August  2020 showed left lower lobe groundglass opacity without any honeycombing.  Acid reflux which is a ongoing problem for her was considered as an etiology.  She also had cough and shortness of breath.  She was treated with 4 months of prednisone which ended early January 2021.  After that the cough is minimal.  She has occasional acid reflux.  In terms of her shortness of breath is very minimal as documented below.  Currently overall from a pulmonary standpoint she is feeling well.  Last pulmonary function testing was in July 2020 and last CT scan of the chest was in end of December 2020 and was essentially unchanged over 4 months.   She had questions about COVID-19 vaccine.  [facility] Comprehensive ILD Questionnaire 0= filled out 06/01/2019   Symptoms: Insidious onset of shortness of breath the last 1 year.  Since it started it is the same.  There is mild cough.    Past Medical History :  -Rheumatoid arthritis diagnosed in 2019.  Has thyroid disease hypothyroidism diagnosed October 2009.  Has history of pneumonia not otherwise specified January 1998.  History is otherwise negative   ROS: Positive for joint stiffness and pain since July 2019 leading to the diagnosis of rheumatoid arthritis subsequently.  She had Raynaud's description once in January 2020.  She is ongoing acid reflux on and off since 2015   FAMILY HISTORY of LUNG DISEASE: Father has emphysema.  Sister has autoimmune disease.   EXPOSURE HISTORY: Denies cigarettes or marijuana or vaping or cocaine or intravenous drug use   HOME and HOBBY DETAILS : Single-family home for the last 16 years.  Age of the home is 17 years.  Does use a humidifier but no mildew in the humidifier.  There is some mildew in the shower curtains.  No mold in the [facility] duct.  No other organic antigen exposure in the house   OCCUPATIONAL HISTORY (122 questions) : Negative for organic antigen exposure.  Negative for inorganic antigen  exposure   PULMONARY TOXICITY HISTORY (27 items): On methotrexate between October 2019 and February 2021.  To prednisone between September 2020 and January 2021.  On Imuran since February 2021    Results for MAISLEY, HAINSWORTH "DEE DEE" (MRN [id number]) as of 05/24/2019 12:22  Ref. Range 10/09/2018 12:56  FVC-%Pred-Pre Latest Units: % 65  FEV1-Pre Latest Units: L 1.57  Results for NANCYANN, COTTERMAN "DEE DEE" (MRN [id number]) as of 05/24/2019 12:22  Ref. Range 10/09/2018 12:56  TLC Latest Units: L 3.07  TLC % pred Latest Units: % 63  Results for Bailly, Khaleelah D "DEE DEE" (MRN [id number]) as of 05/24/2019 12:22  Ref. Range 10/09/2018 12:56  DLCO unc Latest Units: ml/min/mmHg 15.54  DLCO unc % pred Latest Units: % 75    IMPRESSION: High-resolution CT chest December 2020 Lungs/Pleura: High-resolution images again demonstrate basal predominant areas of ground-glass attenuation, septal thickening, mild thickening of the peribronchovascular interstitium and some very mild peripheral bronchiolectasis (most evident in the left lower lobe).  No frank honeycombing. Findings are essentially stable compared to the recent prior study from 10/26/2018. Inspiratory and expiratory imaging is unremarkable. A few scattered tiny pulmonary nodules are noted in the lungs bilaterally, largest of which is a 5 mm nodule in the left upper lobe (axial image 79 of series 3). No other larger more suspicious appearing pulmonary nodules or masses are noted. No pleural effusions. No acute consolidative airspace disease.   1. The appearance of the lungs is compatible with interstitial lung disease, with a stable spectrum of findings technically classified as probable usual interstitial pneumonia (UIP) per current ATS guidelines. However, the possibility of nonspecific interstitial pneumonia is not excluded. Repeat high-resolution chest CT is recommended in 12 months to assess for temporal changes in the appearance  of the lung parenchyma. 2. Tiny pulmonary nodules measuring 5 mm or less in size, stable compared to the prior study. These are nonspecific, but statistically benign. Attention at time of repeat high-resolution chest CT is recommended to ensure continued stability.     Electronically Signed   By: Trudie Reed M.D.   On: 03/14/2019 12:56 ROS - per HPI   OV 07/31/2019  Subjective:  Patient ID: [name], female , DOB: [birth year] , age 73 y.o. , MRN: [id number] , ADDRESS: [address] 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 -returns for follow-up.  Since her last visit her symptoms are similar.  She reports better acid reflux control.  She had pulmonary function test that compared to a year ago shows decline documented below but she herself is feeling fine.  Her walking desaturation test is stable.  Her CT scan of the chest between August 2020 in December 2020 is stable.  She is currently on Imuran and is tolerating it well.  This is being monitored by Dr. Algis Downs.  There are no other new issues.  She reports she had echocardiogram earlier in the year and this was normal.    OV 01/29/2020   Subjective:  Patient ID: [name], female , DOB: 04/23/1975, age 62 y.o. years. 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 -presents for follow-up.  She continues on  Imuran.  She has had a flu shot.  She plans to have a Covid booster.  She continues to be asymptomatic/minimally symptomatic as documented below.  Walking desaturation test is stable.  She had pulmonary function test that compared to the spring of this year shows stable FVC but a slight drop in DLCO.  However there is no change in her walking desaturation test or her symptoms.  The quality of the PFT appears good.  Overall is a decline PFT since last summer but in the interim she has been on Imuran which seems to be helping her.  She has no side effects from Imuran.  She is a little concerned about the drop in  DLCO but she is willing to work with our plan.     ROS    OV 07/08/2020  Subjective:  Patient ID: [name], female , DOB: 03/04/1976 , age 65 y.o. 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 -returns for follow-up.  Last visit I was concerned about more progression.  Today's symptom scores are stable, walking desaturation test is stable pulmonary function test is stable.  Symptom score is very mild.  High-resolution CT chest shows stable ILD.  No progression.  She is done her ILD-per registry visit today.  She is on Imuran and overall she is stable.    HRCT feb 2022  CLINICAL DATA:  Interstitial lung disease, history of rheumatoid arthritis   EXAM: CT CHEST WITHOUT CONTRAST    TECHNIQUE: Multidetector CT imaging of the chest was performed following the standard protocol without intravenous contrast. High resolution imaging of the lungs, as well as inspiratory and expiratory imaging, was performed.   COMPARISON:  03/14/2019   FINDINGS: Cardiovascular: No significant vascular findings. Normal heart size. No pericardial effusion.   Mediastinum/Nodes: Unchanged prominent axillary lymph nodes. No enlarged mediastinal or hilar lymph nodes. Thyroid gland, trachea, and esophagus demonstrate no significant findings.   Lungs/Pleura: Unchanged mild pulmonary fibrosis in a pattern featuring apical to basal gradient, predominantly characterized by irregular peripheral interstitial opacity and ground-glass, with minimal subpleural bronchiolectasis particularly in the left lung base. No significant air trapping on expiratory phase imaging. Stable, definitively benign 5 mm nodule of the lingula (series 5, image 150). No pleural effusion or pneumothorax.   Upper Abdomen: No acute abnormality.   Musculoskeletal: No chest wall mass or suspicious bone lesions identified.   IMPRESSION: 1. Unchanged mild pulmonary fibrosis in a pattern featuring apical to basal gradient, predominantly characterized by irregular peripheral interstitial opacity and ground-glass, with minimal subpleural bronchiolectasis particularly in the left lung base. No significant air trapping on expiratory phase imaging. 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 Findings are categorized as probable UIP per consensus guidelines: Diagnosis of Idiopathic Pulmonary Fibrosis: An Official ATS/ERS/JRS/ALAT Clinical Practice Guideline. Am Rosezetta Schlatter Crit Care Med Vol 198, Iss 5, [phone], Nov 13 2016. 2. Stable, definitively benign 5 mm nodule of the lingula.     Electronically Signed   By: Lauralyn Primes M.D.   On: 05/01/2020 16:59      OV  05/07/2021  Subjective:  Patient ID: [name], female , DOB: 02-Apr-1975 , age 18 y.o. , MRN: [id number] , ADDRESS: [address] [place] [address] PCP Shirlean Mylar, MD Patient Care Team: Shirlean Mylar, MD as PCP - General (Family Medicine)  This Provider for this visit: Treatment Team:  Attending Provider: Kalman Shan, MD    05/07/2021 -   Chief Complaint  Patient presents with   Follow-up    PFT performed today.  Pt states she has been  doing okay since last visit and denies any complaints.  3  HPI [place] y.o. -returns for follow-up.  Last seen 10 months ago.  Since then she has had a visit this year with Sherron Ales in rheumatology.  Imuran is being continued.  She feels quite well controlled with rheumatoid arthritis using Imuran.  There are no new problems.  She tells me in the last 1 year no emergency room visit no medication changes no hospitalizations no urgent care visits.  Shortness of breath is mild.  Walking desaturation test is stable.  Both symptom scores and walking desaturation test suggest stability.  Pulmonary function test shows DLCO is also stable but there is a slight drop in FVC.  We discussed drop in [facility] do not know what to make of it      OV 08/26/2022  Subjective:  Patient ID: [name], female , DOB: [birth year] , age 42 y.o. , MRN: [id number] , ADDRESS: [address] [place] [address] PCP Deeann Saint, MD Patient Care Team: Deeann Saint, MD as PCP - General (Family Medicine)  This Provider for this visit: Treatment Team:  Attending Provider: Kalman Shan, MD   08/26/2022 -   Chief Complaint  Patient presents with   Follow-up    F/up, no complaints     HPI [place] Dione Almendarez 47 y.o. -known ILD secondary to rheumatoid arthritis on Imuran.  She presents for follow-up.  Last seen in February 2023 over a year ago.  She says she continues to be stable.  No new medical problems except  late 2023 she did have COVID and we treated with Paxlovid.  Otherwise no new medical problems.  No emergency room visits no urgent care visits no surgeries.  No changes in medication.  Review of the external records indicate that she did see rheumatology Dr Corliss Skains 07/28/2022.  She did an SPEP and thought was consistent with chronic inflammatory disease.  Hemoglobin slightly lower at 11.2 g.  She advised to continue Imuran 75 mg/day.  She recommended doing CBC.  But she feels well.  Symptom scores are below and she is very minimally symptomatic.  She did have a low-dose CT scan of the chest February 2024.  Radiologist compare this low-dose CT scan with the previous high-resolution CT chest from 2-20 20 for the ILD was stable.  I independently reviewed the CT scan and visualized it and I agree with the radiology findings.  The disease burden is very minimal.  Also of note the research coordinator D Vincent Callas indicated patient is a participant ILD-Pro registry.  However patient does express unwillingness to continue with this protocol.  I talked to the research coordinator today through secure chat and verbally yesterday.  I approached this issue with the patient.  Patient feels she is stable and does not want to continue with the study.  Communicated intent to withdraw patient from the study and stop all follow-up as per her wishes.  Voluntary consent will be withdrawn.   She is also telling me that she is finding cost of medical care quite expensive especially doing PFTs.  She wants test spaced out.  She is employed at [employer] but despite employment health healthcare cost is high.  There is a social determinant of health.  CT Chest data - personally visualized and independently interpreted and my findings are: - CORONARY CT FEB 04/22/22: ILD REPORTED AS STABLE since 2022. I agree     No results found.  OV 06/23/2023  Subjective:  Patient ID: [name], female , DOB: 1975-06-06 , age 53  y.o. , MRN: [id number] , ADDRESS: [address] 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 Then sable through PFt feb 2023  -  Last high-resolution CT chest April 2022  > Then Feb 2024 [facility] CT (reported that ILD is stable  `- Participant in ILD-pro registry- > withdrew in 2024 - Last PFT [employer] 2025  # - Rheumatoid arthritis involving multiple sites with positive rheumatoid factor (HCC) - Positive RF, +14 3 3  eta and elevated ESR.  Diagnosed in 2019.  - Positive ANA (antinuclear antibody) - ANA 1: 1280 speckled, positive Ro antibody without SLE as of Jan 202 - MTX(12/19-02/21)-dcd after shortness of breath -- = On Imuran but not on antifibrotic  06/23/2023 -follow-up interstitial lung disease secondary to rheumatoid arthritis.  Recently stable.  On Imuran therapy.   HPI [name] 48 y.o. -return for her 2-month follow-up. Interim Health status: No new complaints No new medical problems. No new surgeries. No ER visits. No Urgent care visits. No changes to medications.  Continues to remain asymptomatic from a respiratory standpoint.  Tolerating Imuran well under the direction of rheumatology.  She had pulmonary function testing that is stable.  Last decline is  since 2020.  In the interim noted.  See below.  She continues to work at [employer] at [employer].  Exercise hypoxemia test is stable.    SYMPTOM SCALE - ILD 05/24/2019  07/31/2019  01/29/2020 Last Weight  Most recent update: 01/29/2020  2:12 PM     Weight  67.6 kg (149 lb)             07/08/2020  05/07/2021  06/23/2023   O2 use ra ra ra ra ra 0   Shortness of Breath 0 -> 5 scale with 5 being worst (score 6 If unable to do)     0  At rest 0 0 0 0 0 0  Simple tasks - showers, clothes change, eating, shaving 1 0 0 0 0 0  Household (dishes, doing bed, laundry) 1 0 1 0 0 0  Shopping 0 0 0 0 0 0  Walking level at own pace 1 0 0 0 0 0  Walking up Stairs 1 2 2 1 1    Total (30-36) Dyspnea Score 4 2 2 1 1  0  How bad is your cough? 2 1 0 0 1 - durin laugh 0  How bad is your fatigue 1 0 0 0 0 0  How bad is nausea 00 0 0 0 0 0  How bad is vomiting?  0 0  0 0   How bad is diarrhea? 0 0 0 0 0 0  How bad is anxiety? 0 0 0 0 0 0  How bad is depression 0 0 0 0 0 0      Simple office walk 185 feet x  3 laps goal with forehead probe 05/24/2019  07/31/2019  ra 07/08/2020  05/07/2021  08/26/2022  06/23/2023   O2 used ra ra   ra ra ra  Number laps completed 3 3   3  Sit stand Sist and satna d 15  Comments about pace avg Mod pace   Avg [ace  46 sec  Resting Pulse Ox/HR 100% and 89/min 100% and 71 100% and 89/min 98% nand 97 100% and 65 99% and HR 61 100% and HR 75  Final Pulse Ox/HR 99% and 102/min 99% and 85 98% and 107/min 100% and 99/min 100% an 89 98% and HR 89 100% and HR 90  Desaturated </= 88% no        Desaturated <= 3% points no        Got Tachycardic >/= 90/min yes        Symptoms at end of test Very mild dyspnea  No dysonea  No complaints  No complaints  Miscellaneous comments x           PFT     Latest Ref Rng & Units 06/23/2023   10:06 AM 08/24/2022   11:17 AM 05/07/2021    2:54 PM 07/08/2020   11:57 AM 01/29/2020   11:33 AM 07/31/2019    9:58 AM 10/09/2018   12:56 PM  PFT Results  FVC-Pre L 1.78  P 1.79  1.68  1.74  1.70  1.71    FVC-Predicted Pre % 55  P 55  59  60  59  59  65  P  FVC-Post L       1.88  P  FVC-Predicted Post %       65  P  Pre FEV1/FVC % % 88  P 90  95  92  93  89  84  P  Post FEV1/FCV % %       92  P  FEV1-Pre L 1.56  P 1.61  1.60  1.61  1.58  1.52  1.57  P  FEV1-Predicted Pre % 61  P 62  69  69  67   64  67  P  FEV1-Post L       1.73  P  DLCO uncorrected ml/min/mmHg 14.21  P 14.90  13.23  12.40  12.72  13.00  15.54  P  DLCO UNC% % 74  P 77  64  59  61  62  75  P  DLCO corrected ml/min/mmHg 15.24  P 16.11  13.23  12.90  13.43  13.00    DLCO COR %Predicted % 80  P 84  64  62  64  62    DLVA Predicted % 139  P 145  127  134  125  113  128  P  TLC L       3.07  P  TLC % Predicted %       63  P  RV % Predicted %       74  P    P Preliminary result       LAB RESULTS last 96 hours No results found.       has a past medical history of Abnormal Pap smear, Allergy, Dysplasia of cervix, low grade (CIN 1), Essential hypertension (09/06/2022), H/O: pneumonia, Hypothyroidism, and Rheumatoid arthritis (HCC).   reports that she has never smoked. She has been exposed to tobacco smoke. She has never used smokeless tobacco.  Past Surgical History:  Procedure Laterality Date   CESAREAN SECTION     LAPAROSCOPIC TUBAL LIGATION  02/01/2011   Procedure: LAPAROSCOPIC TUBAL LIGATION;  Surgeon: Purcell Nails, MD;  Location: [facility];  Service: Gynecology;  Laterality: Bilateral;   LEEP  TUBAL LIGATION      Allergies  Allergen Reactions   Other     Cats Fruit     Immunization History  Administered Date(s) Administered   Influenza Split 12/19/2012, 03/05/2014, 11/24/2015, 11/28/2017, 12/28/2019, 12/31/2020   Influenza,inj,Quad PF,6+ Mos 12/29/2018   Influenza,inj,quad, With Preservative 01/10/2015   Influenza-Unspecified 12/29/2021, 12/28/2022   PFIZER(Purple Top)SARS-COV-2 Vaccination 05/25/2019, 06/15/2019, 02/01/2020   Td 08/14/2003, 07/02/2015   Tdap 03/05/2014    Family History  Problem Relation Age of Onset   Hypertension Mother    Diabetes Mother    Stroke Mother    Heart attack Father 21 - 9   Hypertension Father    Rheum arthritis Sister    Hypertension Brother    Healthy Daughter    Healthy Son    Rheum arthritis Maternal Aunt    Rheum arthritis Maternal Aunt       Current Outpatient Medications:    amLODipine (NORVASC) 2.5 MG tablet, Take 1 tablet (2.5 mg total) by mouth daily., Disp: 90 tablet, Rfl: 3   aspirin-acetaminophen-caffeine (EXCEDRIN MIGRAINE) 250-250-65 MG tablet, Take by mouth as needed for headache., Disp: , Rfl:    azaTHIOprine (IMURAN) 50 MG tablet, TAKE 1 AND 1/2 TABLETS BY MOUTH DAILY, Disp: 135 tablet, Rfl: 0   levothyroxine (SYNTHROID) 50 MCG tablet, TAKE 1 TABLET BY MOUTH EVERY DAY, Disp: 90 tablet, Rfl: 1   loratadine (CLARITIN) 10 MG tablet, TAKE 1 TABLET BY MOUTH EVERY DAY, Disp: 90 tablet, Rfl: 1   Multiple Vitamin (MULTIVITAMIN WITH MINERALS) TABS tablet, Take 1 tablet by mouth daily., Disp: , Rfl:    Probiotic Product (PROBIOTIC PO), Take by mouth daily., Disp: , Rfl:    Turmeric 1053 MG TABS, daily., Disp: , Rfl:       Objective:   Vitals:   06/23/23 1432  BP: 122/73  Pulse: 78  Temp: 98.2 F (36.8 C)  TempSrc: Oral  SpO2: 99%  Weight: 149 lb 12.8 oz (67.9 kg)  Height: 5\' 1"  (1.549 m)    Estimated body mass index is 28.3 kg/m as calculated from the following:   Height as of this encounter: 5\' 1"  (1.549 m).   Weight as of this encounter: 149 lb 12.8 oz (67.9 kg).  @WEIGHTCHANGE @  [employer]   06/23/23 1432  Weight: 149 lb 12.8 oz (67.9 kg)     Physical Exam   General: No distress. Looks well O2 at rest: no Cane present: no Sitting in wheel chair: no Frail: no Obese: no Neuro: Alert and Oriented x 3. GCS 15. Speech normal Psych: Pleasant Resp:  Barrel Chest - no.  Wheeze - no, Crackles - mild, No overt respiratory distress CVS: Normal heart sounds. Murmurs - no Ext: Stigmata of Connective Tissue Disease - Dryk skin in hand HEENT: Normal upper airway. PEERL +. No post nasal drip        Assessment:       ICD-10-CM   1. ILD (interstitial lung disease) (HCC)  J84.9 Pulmonary function test    2. High risk medication use  Z79.899 Pulmonary function test         Plan:      Patient Instructions     ICD-10-CM   1. ILD (interstitial lung disease) (HCC)  J84.9   2. 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 Worse since 2020 but stable sine then  Plan Continue control of  acid reflux  -Continue Imuran via rheumatology  -  should help with joints and also has antifibrotic effect  -No indication for nintedanib antifibrotic currently given stability.     - Monitor ILD closely; do spirometry and dlco in 9 months  Follow-up -Return in 9 months after PFT testing  = symptom score and simple walk test at followup  -  If there is evidence of continued progression then we can discuss antifibrotic therapy  -15-minute visit   FOLLOWUP Return in about 9 months (around 03/24/2024) for 15 min visit, Face to Face Visit, with Dr Marchelle Gearing, after Cleda Daub and DLCO.    SIGNATURE    Dr. Kalman Shan, M.D., F.C.C.P,  Pulmonary and Critical Care Medicine Staff Physician, [facility] Director - Interstitial Lung Disease  Program  Pulmonary Fibrosis [facility] Network at [facility] [place], [place], [address]  Pager: [phone], If no answer or between  15:00h - 7:00h: call [phone] Telephone: [phone]  2:58 PM 06/23/2023

## 2023-08-23 ENCOUNTER — Other Ambulatory Visit: Payer: Self-pay | Admitting: Rheumatology

## 2023-08-23 ENCOUNTER — Other Ambulatory Visit: Payer: Self-pay | Admitting: *Deleted

## 2023-08-23 DIAGNOSIS — Z79899 Other long term (current) drug therapy: Secondary | ICD-10-CM

## 2023-08-23 LAB — COMPREHENSIVE METABOLIC PANEL WITH GFR
AG Ratio: 1.2 (calc) (ref 1.0–2.5)
ALT: 9 U/L (ref 6–29)
AST: 25 U/L (ref 10–35)
Albumin: 4.4 g/dL (ref 3.6–5.1)
Alkaline phosphatase (APISO): 45 U/L (ref 31–125)
BUN: 15 mg/dL (ref 7–25)
CO2: 31 mmol/L (ref 20–32)
Calcium: 9.8 mg/dL (ref 8.6–10.2)
Chloride: 103 mmol/L (ref 98–110)
Creat: 0.63 mg/dL (ref 0.50–0.99)
Globulin: 3.7 g/dL (ref 1.9–3.7)
Glucose, Bld: 94 mg/dL (ref 65–99)
Potassium: 4.1 mmol/L (ref 3.5–5.3)
Sodium: 139 mmol/L (ref 135–146)
Total Bilirubin: 0.4 mg/dL (ref 0.2–1.2)
Total Protein: 8.1 g/dL (ref 6.1–8.1)
eGFR: 109 mL/min/{1.73_m2} (ref >=60)

## 2023-08-23 LAB — CBC WITH DIFFERENTIAL/PLATELET
Absolute Lymphocytes: 1528 {cells}/uL (ref 850–3900)
Absolute Monocytes: 356 {cells}/uL (ref 200–950)
Basophils Absolute: 32 {cells}/uL (ref 0–200)
Basophils Relative: 0.8 %
Eosinophils Absolute: 204 {cells}/uL (ref 15–500)
Eosinophils Relative: 5.1 %
HCT: 35.2 % (ref 35.0–45.0)
Hemoglobin: 11.3 g/dL — ABNORMAL LOW (ref 11.7–15.5)
MCH: 28.8 pg (ref 27.0–33.0)
MCHC: 32.1 g/dL (ref 32.0–36.0)
MCV: 89.6 fL (ref 80.0–100.0)
MPV: 10.7 fL (ref 7.5–12.5)
Monocytes Relative: 8.9 %
Neutro Abs: 1880 {cells}/uL (ref 1500–7800)
Neutrophils Relative %: 47 %
Platelets: 300 10*3/uL (ref 140–400)
RBC: 3.93 10*6/uL (ref 3.80–5.10)
RDW: 13.8 % (ref 11.0–15.0)
Total Lymphocyte: 38.2 %
WBC: 4 10*3/uL (ref 3.8–10.8)

## 2023-08-23 NOTE — Telephone Encounter (Signed)
 Last Fill: 05/16/2023  Labs: 05/19/2023 CBC and CMP are stable.  Hemoglobin remains low.   Next Visit: 11/07/2023  Last Visit: 06/07/2023  DX: Rheumatoid arthritis involving multiple sites with positive rheumatoid factor   Current Dose per office note 06/07/2023: Imuran  75 mg by mouth daily   Patient advised she is due to update her labs. Patient states she will come today or tomorrow to update.   Okay to refill Imuran ?

## 2023-08-24 ENCOUNTER — Ambulatory Visit: Payer: Self-pay | Admitting: Rheumatology

## 2023-08-24 NOTE — Progress Notes (Signed)
Hemoglobin is low and stable.  CMP is normal.

## 2023-10-24 NOTE — Progress Notes (Signed)
 Office Visit Note  Patient: [name]             Date of Birth: 1976/02/12           MRN: [id number]             PCP: Mercer Clotilda SAUNDERS, MD Referring: Mercer Clotilda SAUNDERS, MD Visit Date: 11/07/2023 Occupation: @GUAROCC @  Subjective:  Dry mouth   History of Present Illness: Joanne  Dione French is a 48 y.o. female with history of seropositive rheumatoid arthritis and sjogren's syndrome.  Patient remains on imuran  75 mg by mouth daily.  She is tolerating Imuran  without any side effects and has not had any gaps in therapy.  She denies any signs or symptoms of a rheumatoid arthritis flare.  She has occasional discomfort in her right shoulder but denies any current pain or stiffness.  She denies any joint swelling at this time.  Patient states for the past several weeks she has been noticing increased mouth dryness.  She has not been using any over-the-counter products for relief.  She denies any new medication changes.  She denies any increased eye dryness. She denies any new or worsening pulmonary symptoms.  Patient was evaluated by Dr. Geronimo on 06/23/2023.     Activities of Daily Living:  Patient reports morning stiffness for 0 minute.   Patient Denies nocturnal pain.  Difficulty dressing/grooming: Denies Difficulty climbing stairs: Denies Difficulty getting out of chair: Denies Difficulty using hands for taps, buttons, cutlery, and/or writing: Denies  Review of Systems  Constitutional:  Negative for fatigue.  HENT:  Positive for mouth dryness. Negative for mouth sores.   Eyes:  Negative for dryness.  Respiratory:  Negative for shortness of breath.   Cardiovascular:  Negative for chest pain and palpitations.  Gastrointestinal:  Negative for blood in stool, constipation and diarrhea.  Endocrine: Negative for increased urination.  Genitourinary:  Negative for involuntary urination.  Musculoskeletal:  Positive for joint pain and joint pain. Negative for gait problem, joint  swelling, myalgias, muscle weakness, morning stiffness, muscle tenderness and myalgias.  Skin:  Negative for color change, rash, hair loss and sensitivity to sunlight.  Allergic/Immunologic: Negative for susceptible to infections.  Neurological:  Negative for dizziness and headaches.  Hematological:  Negative for swollen glands.  Psychiatric/Behavioral:  Negative for depressed mood and sleep disturbance. The patient is not nervous/anxious.     PMFS History:  Patient Active Problem List   Diagnosis Date Noted   Positive ANA (antinuclear antibody) 06/07/2023   Essential hypertension 09/06/2022   Abnormal cervical Papanicolaou smear 07/08/2020   Cyst of ovary 07/08/2020   Encounter for therapeutic drug level monitoring 07/08/2020   Hypothyroidism 07/08/2020   Irregular periods 07/08/2020   Menopause 07/08/2020   Mixed hyperlipidemia 07/08/2020   Oligomenorrhea 07/08/2020   Pain in pelvis 07/08/2020   Stenosis of cervix 07/08/2020   Vaginal high risk human papillomavirus (HPV) DNA test positive 07/08/2020   Vitamin D deficiency 07/08/2020   ILD (interstitial lung disease) (HCC) 02/20/2019   Restrictive lung disease 10/09/2018   RA (rheumatoid arthritis) (HCC) 09/07/2018   H/O: pneumonia    Dysplasia of cervix, low grade (CIN 1 and 2)     Past Medical History:  Diagnosis Date   Abnormal Pap smear    Allergy    Dysplasia of cervix, low grade (CIN 1)    Essential hypertension 09/06/2022   H/O: pneumonia    Hypothyroidism    Rheumatoid arthritis (HCC)     Family History  Problem Relation Age of Onset   Hypertension Mother    Diabetes Mother    Stroke Mother    Heart attack Father 38 - 39   Hypertension Father    Rheum arthritis Sister    Hypertension Brother    Healthy Daughter    Healthy Son    Rheum arthritis Maternal Aunt    Rheum arthritis Maternal Aunt    Past Surgical History:  Procedure Laterality Date   CESAREAN SECTION     LAPAROSCOPIC TUBAL LIGATION   02/01/2011   Procedure: LAPAROSCOPIC TUBAL LIGATION;  Surgeon: Jon CINDERELLA Rummer, MD;  Location: [facility];  Service: Gynecology;  Laterality: Bilateral;   LEEP     TUBAL LIGATION     Social History   Social History Narrative   Not on file   Immunization History  Administered Date(s) Administered   Influenza Split 12/19/2012, 03/05/2014, 11/24/2015, 11/28/2017, 12/28/2019, 12/31/2020   Influenza,inj,Quad PF,6+ Mos 12/29/2018   Influenza,inj,quad, With Preservative 01/10/2015   Influenza-Unspecified 12/29/2021, 12/28/2022   PFIZER(Purple Top)SARS-COV-2 Vaccination 05/25/2019, 06/15/2019, 02/01/2020   Td 08/14/2003, 07/02/2015   Tdap 03/05/2014     Objective: Vital Signs: BP 126/85 (BP Location: Left Arm, Patient Position: Sitting, Cuff Size: Normal)   Pulse 71   Resp 12   Ht 5' 1 (1.549 m)   Wt 148 lb (67.1 kg)   LMP 08/26/2013   BMI 27.96 kg/m    Physical Exam Vitals and nursing note reviewed.  Constitutional:      Appearance: She is well-developed.  HENT:     Head: Normocephalic and atraumatic.  Eyes:     Conjunctiva/sclera: Conjunctivae normal.  Cardiovascular:     Rate and Rhythm: Normal rate and regular rhythm.     Heart sounds: Normal heart sounds.  Pulmonary:     Effort: Pulmonary effort is normal.     Breath sounds: Normal breath sounds.  Abdominal:     General: Bowel sounds are normal.     Palpations: Abdomen is soft.  Musculoskeletal:     Cervical back: Normal range of motion.  Lymphadenopathy:     Cervical: No cervical adenopathy.  Skin:    General: Skin is warm and dry.     Capillary Refill: Capillary refill takes less than 2 seconds.  Neurological:     Mental Status: She is alert and oriented to person, place, and time.  Psychiatric:        Behavior: Behavior normal.      Musculoskeletal Exam: C-spine, thoracic spine, lumbar spine good range of motion.  Shoulder joints, elbow joints, wrist joints, MCPs, PIPs, DIPs have good range of motion with  no synovitis.  Complete fist formation bilaterally.  Hip joints have good range of motion with no groin pain.  Knee joints have good range of motion no warmth or effusion.  Ankle joints have good range of motion with no tenderness or joint swelling.  CDAI Exam: CDAI Score: -- Patient Global: --; Provider Global: -- Swollen: --; Tender: -- Joint Exam 11/07/2023   No joint exam has been documented for this visit   There is currently no information documented on the homunculus. Go to the Rheumatology activity and complete the homunculus joint exam.  Investigation: No additional findings.  Imaging: No results found.  Recent Labs: Lab Results  Component Value Date   WBC 4.0 08/23/2023   HGB 11.3 (L) 08/23/2023   PLT 300 08/23/2023   NA 139 08/23/2023   K 4.1 08/23/2023   CL 103 08/23/2023  CO2 31 08/23/2023   GLUCOSE 94 08/23/2023   BUN 15 08/23/2023   CREATININE 0.63 08/23/2023   BILITOT 0.4 08/23/2023   ALKPHOS 44 02/28/2023   AST 25 08/23/2023   ALT 9 08/23/2023   PROT 8.1 08/23/2023   ALBUMIN 4.3 02/28/2023   CALCIUM 9.8 08/23/2023   GFRAA 122 06/12/2020   QFTBGOLDPLUS NEGATIVE 03/07/2019    Speciality Comments: Treated with methotrexate (December 2019-February 2021)which was stopped due to shortness of breath. Imuran  and started in February 2021  Procedures:  No procedures performed Allergies: Other   Assessment / Plan:     Visit Diagnoses: Rheumatoid arthritis involving multiple sites with positive rheumatoid factor (HCC) - Positive RF, +14 3 3  eta, +ANA and elevated ESR.  Diagnosed in 2019. MTX(12/19-02/21)-dcd after shortness of breath: She has no synovitis on examination today.  She is clinically been doing well taking Imuran  75 mg daily.  She continues to tolerate Imuran  without any side effects and has not had any recent gaps in therapy.  She has occasional discomfort in the right shoulder but has full range of motion on examination today.  No nocturnal pain  currently. No medication changes will be made at this time.  A refill of Imuran  was sent to the pharmacy today.  She was advised to notify us  if she develops any signs or symptoms of a flare.  She will follow-up in the office in 5 months or sooner if needed. - Plan: Sedimentation rate, Rheumatoid factor  High risk medication use - Imuran  75 mg by mouth daily.(started in February 2021) Previous therapy: MTX 4 tablets by mouth once weekly and folic acid 1 mg po qd (d/c January 2021). CBC and CMP updated on 08/23/23. Orders for CBC and CMP released today. She will continue to require updated lab work every 3 months.  Lipid panel updated on 02/28/23.  No recent or recurrent infections. Discussed the importance of holding imuran  if she develops signs or symptoms of an infection and to resume once the infection has completely cleared.   - Plan: CBC with Differential/Platelet, Comprehensive metabolic panel with GFR  Sjogren's syndrome with other organ involvement (HCC) - ANA 1: 1280 speckled, positive Ro antibody: Patient has noticed increase symptoms of dry mouth for the past several weeks.  No medication changes or identifiable trigger.  Discussed the use of over-the-counter products including XyliMelts and Biotene products.  Discussed the importance of continuing to see the dentist every 6 months and ophthalmology on a yearly basis.  She is using a humidifier in her bedroom at home.   Discussed increased risk for developing lymphoma in patients with Sjogren syndrome.  Plan to check SPEP today. Patient remains under the care of Dr. Geronimo for management of interstitial lung disease--clinically stable.  No antifibrotic therapy indicated at this time.  She will notify us  if she develops any new or worsening symptoms.  She will remain on Imuran  75 mg daily.  A refill was sent to the pharmacy today.  Plan to obtain the following lab work today for further evaluation.  - Plan: Serum protein electrophoresis  with reflex, Sedimentation rate, C3 and C4, Sjogrens syndrome-A extractable nuclear antibody, ANA, Rheumatoid factor  Raynaud's syndrome without gangrene: Intermittent symptoms.  No digital ulcerations noted.  ILD (interstitial lung disease) (HCC) - Followed by Dr. Geronimo.  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   No new or worsening pulmonary symptoms. Pulmonary function testing updated on 06/23/2023. Patient was evaluated by Dr.  Ramaswamy on 06/23/2023--clinically stable.  Recommended repeating spirometry and DLCO in 9 months.  No recommendation for antifibrotic's at this time since overall clinically stable. She will remain on Imuran  as prescribed.  Other medical conditions are listed as follows:  History of hyperlipidemia: Lipid panel updated on 02/28/2023.  Thyroid  goiter  Gastroesophageal reflux disease without esophagitis  History of hypothyroidism  Dysplasia of cervix, low grade (CIN 1 and 2)  Vitamin D deficiency  Positive ANA (antinuclear antibody): Plan to recheck the following lab work today for further evaluation.  Elevated LFTs: LFTs within normal limits on 08/23/2023.  Orders: Orders Placed This Encounter  Procedures   CBC with Differential/Platelet   Comprehensive metabolic panel with GFR   Serum protein electrophoresis with reflex   Sedimentation rate   C3 and C4   Sjogrens syndrome-A extractable nuclear antibody   ANA   Rheumatoid factor   Meds ordered this encounter  Medications   azaTHIOprine  (IMURAN ) 50 MG tablet    Sig: Take 1.5 tablets (75 mg total) by mouth daily.    Dispense:  135 tablet    Refill:  0     Follow-Up Instructions: Return in about 5 months (around 04/08/2024) for Rheumatoid arthritis, Sjogren's syndrome.   Waddell CHRISTELLA Craze, PA-C  Note - This record has been created using Dragon software.  Chart creation errors have been sought, but may not always  have been located. Such creation errors do  not reflect on  the standard of medical care.

## 2023-10-25 ENCOUNTER — Other Ambulatory Visit: Payer: Self-pay | Admitting: Family Medicine

## 2023-10-25 DIAGNOSIS — E039 Hypothyroidism, unspecified: Secondary | ICD-10-CM

## 2023-11-07 ENCOUNTER — Ambulatory Visit: Attending: Physician Assistant | Admitting: Physician Assistant

## 2023-11-07 ENCOUNTER — Encounter: Payer: Self-pay | Admitting: Physician Assistant

## 2023-11-07 VITALS — BP 126/85 | HR 71 | Resp 12 | Ht 61.0 in | Wt 148.0 lb

## 2023-11-07 DIAGNOSIS — N870 Mild cervical dysplasia: Secondary | ICD-10-CM

## 2023-11-07 DIAGNOSIS — K219 Gastro-esophageal reflux disease without esophagitis: Secondary | ICD-10-CM

## 2023-11-07 DIAGNOSIS — J849 Interstitial pulmonary disease, unspecified: Secondary | ICD-10-CM

## 2023-11-07 DIAGNOSIS — M0579 Rheumatoid arthritis with rheumatoid factor of multiple sites without organ or systems involvement: Secondary | ICD-10-CM | POA: Diagnosis not present

## 2023-11-07 DIAGNOSIS — E049 Nontoxic goiter, unspecified: Secondary | ICD-10-CM

## 2023-11-07 DIAGNOSIS — E559 Vitamin D deficiency, unspecified: Secondary | ICD-10-CM

## 2023-11-07 DIAGNOSIS — Z79899 Other long term (current) drug therapy: Secondary | ICD-10-CM

## 2023-11-07 DIAGNOSIS — Z8639 Personal history of other endocrine, nutritional and metabolic disease: Secondary | ICD-10-CM

## 2023-11-07 DIAGNOSIS — M3509 Sicca syndrome with other organ involvement: Secondary | ICD-10-CM | POA: Diagnosis not present

## 2023-11-07 DIAGNOSIS — R768 Other specified abnormal immunological findings in serum: Secondary | ICD-10-CM

## 2023-11-07 DIAGNOSIS — R7989 Other specified abnormal findings of blood chemistry: Secondary | ICD-10-CM

## 2023-11-07 DIAGNOSIS — I7300 Raynaud's syndrome without gangrene: Secondary | ICD-10-CM

## 2023-11-07 MED ORDER — AZATHIOPRINE 50 MG PO TABS: 75.0000 mg | ORAL_TABLET | Freq: Every day | ORAL | 0 refills | Status: DC

## 2023-11-08 ENCOUNTER — Ambulatory Visit: Payer: Self-pay | Admitting: Physician Assistant

## 2023-11-08 DIAGNOSIS — R768 Other specified abnormal immunological findings in serum: Secondary | ICD-10-CM

## 2023-11-08 NOTE — Progress Notes (Signed)
Ro antibody remains positive.

## 2023-11-08 NOTE — Progress Notes (Signed)
 RF remains positive.   Complements WNL

## 2023-11-08 NOTE — Progress Notes (Signed)
 Hemoglobin remains borderline low but is improving.   MCHC is borderline low. Rest of CBC WNL.  We will continue to monitor.   Total protein is low but has improved.  Rest of CMP WNL  ESR remains elevated

## 2023-11-10 NOTE — Progress Notes (Signed)
 SPEP revealed an elevation in globulins--please see if immunoglobulins can be added.   IFE pending.  ANA remains positive

## 2023-11-11 LAB — TEST AUTHORIZATION

## 2023-11-11 LAB — SJOGRENS SYNDROME-A EXTRACTABLE NUCLEAR ANTIBODY: SSA (Ro) (ENA) Antibody, IgG: >8 AI — AB

## 2023-11-11 LAB — CBC WITH DIFFERENTIAL/PLATELET
Absolute Lymphocytes: 1399 {cells}/uL (ref 850–3900)
Absolute Monocytes: 354 {cells}/uL (ref 200–950)
Basophils Absolute: 31 {cells}/uL (ref 0–200)
Basophils Relative: 0.6 %
Eosinophils Absolute: 151 {cells}/uL (ref 15–500)
Eosinophils Relative: 2.9 %
HCT: 36.9 % (ref 35.0–45.0)
Hemoglobin: 11.6 g/dL — ABNORMAL LOW (ref 11.7–15.5)
MCH: 28.1 pg (ref 27.0–33.0)
MCHC: 31.4 g/dL — ABNORMAL LOW (ref 32.0–36.0)
MCV: 89.3 fL (ref 80.0–100.0)
MPV: 10.9 fL (ref 7.5–12.5)
Monocytes Relative: 6.8 %
Neutro Abs: 3266 {cells}/uL (ref 1500–7800)
Neutrophils Relative %: 62.8 %
Platelets: 264 Thousand/uL (ref 140–400)
RBC: 4.13 Million/uL (ref 3.80–5.10)
RDW: 13.4 % (ref 11.0–15.0)
Total Lymphocyte: 26.9 %
WBC: 5.2 Thousand/uL (ref 3.8–10.8)

## 2023-11-11 LAB — COMPREHENSIVE METABOLIC PANEL WITH GFR
AG Ratio: 1.2 (calc) (ref 1.0–2.5)
ALT: 10 U/L (ref 6–29)
AST: 30 U/L (ref 10–35)
Albumin: 4.5 g/dL (ref 3.6–5.1)
Alkaline phosphatase (APISO): 55 U/L (ref 31–125)
BUN: 14 mg/dL (ref 7–25)
CO2: 29 mmol/L (ref 20–32)
Calcium: 9.7 mg/dL (ref 8.6–10.2)
Chloride: 103 mmol/L (ref 98–110)
Creat: 0.64 mg/dL (ref 0.50–0.99)
Globulin: 3.9 g/dL — ABNORMAL HIGH (ref 1.9–3.7)
Glucose, Bld: 87 mg/dL (ref 65–99)
Potassium: 3.7 mmol/L (ref 3.5–5.3)
Sodium: 139 mmol/L (ref 135–146)
Total Bilirubin: 0.5 mg/dL (ref 0.2–1.2)
Total Protein: 8.4 g/dL — ABNORMAL HIGH (ref 6.1–8.1)
eGFR: 108 mL/min/1.73m2

## 2023-11-11 LAB — SEDIMENTATION RATE: Sed Rate: 62 mm/h — ABNORMAL HIGH (ref 0–20)

## 2023-11-11 LAB — PROTEIN ELECTROPHORESIS, SERUM, WITH REFLEX
Albumin ELP: 4.4 g/dL (ref 3.8–4.8)
Alpha 1: 0.3 g/dL (ref 0.2–0.3)
Alpha 2: 0.7 g/dL (ref 0.5–0.9)
Beta 2: 0.5 g/dL (ref 0.2–0.5)
Beta Globulin: 0.5 g/dL (ref 0.4–0.6)
Gamma Globulin: 2.1 g/dL — ABNORMAL HIGH (ref 0.8–1.7)
Total Protein: 8.5 g/dL — ABNORMAL HIGH (ref 6.1–8.1)

## 2023-11-11 LAB — ANTI-NUCLEAR AB-TITER (ANA TITER)
ANA TITER: 1:1280 {titer} — ABNORMAL HIGH
ANA Titer 1: 1:160 {titer} — ABNORMAL HIGH

## 2023-11-11 LAB — IGG, IGA, IGM
IgG (Immunoglobin G), Serum: 2480 mg/dL — ABNORMAL HIGH (ref 600–1640)
IgM, Serum: 195 mg/dL (ref 50–300)
Immunoglobulin A: 443 mg/dL — ABNORMAL HIGH (ref 47–310)

## 2023-11-11 LAB — IFE INTERPRETATION

## 2023-11-11 LAB — ANA: Anti Nuclear Antibody (ANA): POSITIVE — AB

## 2023-11-11 LAB — C3 AND C4
C3 Complement: 165 mg/dL (ref 83–193)
C4 Complement: 26 mg/dL (ref 15–57)

## 2023-11-11 LAB — RHEUMATOID FACTOR: Rheumatoid fact SerPl-aCnc: 196 [IU]/mL — ABNORMAL HIGH (ref <14)

## 2023-11-14 NOTE — Progress Notes (Signed)
 IFE normal  IgA and IgG remain elevated.   Please clarify if the patient has a hematologist?

## 2023-11-15 NOTE — Progress Notes (Signed)
 Ok to refer to hematology for further evaluation. May be related to chronic inflammatory condition but it will be best to have hematology evaluate further

## 2023-11-16 NOTE — Telephone Encounter (Signed)
 Patient advised Ok to refer to hematology for further evaluation. May be related to chronic inflammatory condition but it will be best to have hematology evaluate further. Patient verbalized understanding. Referral placed.

## 2023-11-29 NOTE — Telephone Encounter (Signed)
 Referral changed to [facility], I called patient.

## 2023-12-05 ENCOUNTER — Encounter: Payer: Self-pay | Admitting: Oncology

## 2023-12-05 ENCOUNTER — Inpatient Hospital Stay

## 2023-12-05 ENCOUNTER — Inpatient Hospital Stay: Attending: Oncology | Admitting: Oncology

## 2023-12-05 ENCOUNTER — Ambulatory Visit: Payer: Self-pay

## 2023-12-05 VITALS — BP 136/83 | HR 65 | Temp 98.3°F | Resp 15 | Ht 61.0 in | Wt 150.9 lb

## 2023-12-05 DIAGNOSIS — R768 Other specified abnormal immunological findings in serum: Secondary | ICD-10-CM | POA: Insufficient documentation

## 2023-12-05 DIAGNOSIS — D649 Anemia, unspecified: Secondary | ICD-10-CM | POA: Insufficient documentation

## 2023-12-05 LAB — CBC WITH DIFFERENTIAL (CANCER CENTER ONLY)
Abs Immature Granulocytes: 0.01 K/uL (ref 0.00–0.07)
Basophils Absolute: 0 K/uL (ref 0.0–0.1)
Basophils Relative: 1 %
Eosinophils Absolute: 0.2 K/uL (ref 0.0–0.5)
Eosinophils Relative: 5 %
HCT: 35.2 % — ABNORMAL LOW (ref 36.0–46.0)
Hemoglobin: 11.2 g/dL — ABNORMAL LOW (ref 12.0–15.0)
Immature Granulocytes: 0 %
Lymphocytes Relative: 40 %
Lymphs Abs: 1.5 K/uL (ref 0.7–4.0)
MCH: 28.6 pg (ref 26.0–34.0)
MCHC: 31.8 g/dL (ref 30.0–36.0)
MCV: 90 fL (ref 80.0–100.0)
Monocytes Absolute: 0.4 K/uL (ref 0.1–1.0)
Monocytes Relative: 10 %
Neutro Abs: 1.6 K/uL — ABNORMAL LOW (ref 1.7–7.7)
Neutrophils Relative %: 44 %
Platelet Count: 291 K/uL (ref 150–400)
RBC: 3.91 MIL/uL (ref 3.87–5.11)
RDW: 13.8 % (ref 11.5–15.5)
WBC Count: 3.7 K/uL — ABNORMAL LOW (ref 4.0–10.5)
nRBC: 0 % (ref 0.0–0.2)

## 2023-12-05 LAB — CMP (CANCER CENTER ONLY)
ALT: 19 U/L (ref 0–44)
AST: 42 U/L — ABNORMAL HIGH (ref 15–41)
Albumin: 4.7 g/dL (ref 3.5–5.0)
Alkaline Phosphatase: 50 U/L (ref 38–126)
Anion gap: 11 (ref 5–15)
BUN: 10 mg/dL (ref 6–20)
CO2: 26 mmol/L (ref 22–32)
Calcium: 10.6 mg/dL — ABNORMAL HIGH (ref 8.9–10.3)
Chloride: 104 mmol/L (ref 98–111)
Creatinine: 0.75 mg/dL (ref 0.44–1.00)
GFR, Estimated: >60 mL/min (ref >60)
Glucose, Bld: 95 mg/dL (ref 70–99)
Potassium: 3.6 mmol/L (ref 3.5–5.1)
Sodium: 141 mmol/L (ref 135–145)
Total Bilirubin: 0.5 mg/dL (ref 0.0–1.2)
Total Protein: 8.8 g/dL — ABNORMAL HIGH (ref 6.5–8.1)

## 2023-12-05 LAB — LACTATE DEHYDROGENASE: LDH: 202 U/L — ABNORMAL HIGH (ref 98–192)

## 2023-12-05 NOTE — Telephone Encounter (Signed)
 FYI Only or Action Required?: FYI only for provider.  Patient was last seen in primary care on 02/28/2023 by Mercer Clotilda SAUNDERS, MD.  Called Nurse Triage reporting Headache.  Symptoms began Friday.  Interventions attempted: OTC medications: excedrin  without relief.  Symptoms are: gradually worsening.  Triage Disposition: See Physician Within 24 Hours  Patient/caregiver understands and will follow disposition?: Yes     Pt sent to triage from [place] [facility] to be triaged - pt scheduled appt through online   Reason for Disposition  [1] MODERATE headache (e.g., interferes with normal activities) AND [2] present > 24 hours AND [3] unexplained  (Exceptions: Pain medicines not tried, typical migraine, or headache part of viral illness.)  Answer Assessment - Initial Assessment Questions 1. LOCATION: Where does it hurt?      Headache moves to different locations of head but constant since Friday 2. ONSET: When did the headache start? (e.g., minutes, hours, days)      X Friday 3. PATTERN: Does the pain come and go, or has it been constant since it started?     constant 4. SEVERITY: How bad is the pain? and What does it keep you from doing?  (e.g., Scale 1-10; mild, moderate, or severe)     7/10 5. RECURRENT SYMPTOM: Have you ever had headaches before? If Yes, ask: When was the last time? and What happened that time?      yes 6. CAUSE: What do you think is causing the headache?     unknown 7. MIGRAINE: Have you been diagnosed with migraine headaches? If Yes, ask: Is this headache similar?      no 8. HEAD INJURY: Has there been any recent injury to your head?      no 9. OTHER SYMPTOMS: Do you have any other symptoms? (e.g., fever, stiff neck, eye pain, sore throat, cold symptoms)     Feel like something in eyes, sore throat only at times due to sinus drainage. 10. PREGNANCY: Is there any chance you are pregnant? When was your last menstrual period?        na  Usually takes Excedrin  migraine and then it goes away 30 minutes - but this headache has been lasting since Friday  Protocols used: [facility]

## 2023-12-05 NOTE — Assessment & Plan Note (Addendum)
 Elevated IgG at 2480 mg/dL and IgA at 559 mg/dL, with previous IgG elevation in 2020 at 1700 mg/dL.   Differential diagnosis includes inflammation due to rheumatoid arthritis and Sjogren's syndrome, or a potential bone marrow disorder such as monoclonal gammopathy.   The pattern of elevation in both IgG and IgA suggests inflammation rather than a monoclonal process. No signs of anemia, kidney dysfunction, or hypercalcemia to suggest multiple myeloma.   COVID-19 infection in July 2025 may have influenced immunoglobulin levels.  Labs today showed chronic, mild anemia with hemoglobin of 11.2.  Calcium borderline at 10.6 but still within normal range.  Creatinine normal at 0.75.  For further evaluation, we checked SPEP, IFE, quantitative immunoglobulins, serum free light chains today.  - Call her with results in 2-3 weeks.  If no evidence of monoclonal gammopathy is found, she can be discharged from our office for continued follow-up with her PCP and rheumatology clinic.  She was provided reassurance.

## 2023-12-05 NOTE — Assessment & Plan Note (Signed)
 Chronic, mild anemia in the range of 11-11.6.  Likely from medications, anemia of chronic disease.  No additional intervention needed at current levels.  Continue surveillance.

## 2023-12-05 NOTE — Progress Notes (Signed)
 [facility]  HEMATOLOGY CLINIC CONSULTATION NOTE   PATIENT NAME: Joanne  Tatiyana French   MR#: [id number] DOB: [birth year]  DATE OF SERVICE: 12/05/2023   REFERRING PROVIDER  Joanne Craze, PA-C  Patient Care Team: Joanne Clotilda SAUNDERS, MD as PCP - General (Family Medicine) French Joanne HERO, PA-C as Physician Assistant (Physician Assistant)   REASON FOR CONSULTATION/ CHIEF COMPLAINT: Elevated serum IgG and IgA levels.    ASSESSMENT & PLAN:  [name] is a 48 y.o. lady with a past medical history of seropositive rheumatoid arthritis, Sjogren syndrome, hypertension, hypothyroidism, dyslipidemia, interstitial lung disease, was referred to our service for evaluation of elevated IgA and IgG levels in the serum.    High total serum IgG Elevated IgG at 2480 mg/dL and IgA at 559 mg/dL, with previous IgG elevation in 2020 at 1700 mg/dL.   Differential diagnosis includes inflammation due to rheumatoid arthritis and Sjogren's syndrome, or a potential bone marrow disorder such as monoclonal gammopathy.   The pattern of elevation in both IgG and IgA suggests inflammation rather than a monoclonal process. No signs of anemia, kidney dysfunction, or hypercalcemia to suggest multiple myeloma.   COVID-19 infection in July 2025 may have influenced immunoglobulin levels.  Labs today showed chronic, mild anemia with hemoglobin of 11.2.  Calcium borderline at 10.6 but still within normal range.  Creatinine normal at 0.75.  For further evaluation, we checked SPEP, IFE, quantitative immunoglobulins, serum free light chains today.  - Call her with results in 2-3 weeks.  If no evidence of monoclonal gammopathy is found, she can be discharged from our office for continued follow-up with her PCP and rheumatology clinic.  She was provided reassurance.  Normocytic anemia Chronic, mild anemia in the range of 11-11.6.  Likely from medications, anemia of chronic disease.  No additional  intervention needed at current levels.  Continue surveillance.   I reviewed lab results and outside records for this visit and discussed relevant results with the patient. Diagnosis, plan of care and treatment options were also discussed in detail with the patient. Opportunity provided to ask questions and answers provided to her apparent satisfaction. Provided instructions to call our clinic with any problems, questions or concerns prior to return visit. I recommended to continue follow-up with PCP and sub-specialists. She verbalized understanding and agreed with the plan. No barriers to learning was detected.  Joanne Patten, MD  12/05/2023 4:21 PM  [facility] - A DEPT OF [name]. [facility] [id number] [place] [address] Dept: [phone] Dept Fax: [phone]   HISTORY OF PRESENT ILLNESS:  Discussed the use of AI scribe software for clinical note transcription with the patient, who gave verbal consent to proceed.  History of Present Illness Joanne  Dione Garr [name] is a 48 year old female with rheumatoid arthritis and Sjogren's syndrome who presents with elevated immunoglobulin levels. She was referred by her rheumatology doctor due to abnormal blood work indicating elevated immunoglobulin levels.  On 11/07/2023, labs at her rheumatologist office showed increased gammaglobulins on SPEP.  Quantitative IgG was increased at 2480 mg/dL and IgA was also increased at 443 mg/dL, IgM normal at 804.  She has a history of rheumatoid arthritis and Sjogren's syndrome, diagnosed at the end of 2019. Initially, she experienced swollen hands, which affected her ability to perform daily tasks such as buttoning clothes and opening bottles. She was initially treated with methotrexate and later switched to Imuran  in  2021, which has controlled her symptoms well.  She also has interstitial lung disease, for which she sees a  pulmonologist. Her condition is stable with no new shortness of breath or concerning symptoms.  Recent blood work showed elevated levels of immunoglobulins IgG and IgA. In August 2025, her IgG was 2480 mg/dL (normal up to 8349 mg/dL) and IgA was 559 mg/dL (normal up to 689 mg/dL). A previous test in 2020 showed slightly elevated IgG at 1700 mg/dL. She has mild anemia with a hemoglobin level of 11.6 g/dL (normal is 12 g/dL). She experienced headaches over the past weekend, which resolved with medication, and questions if they could be related to her anemia.  She had COVID-19 in mid-2025 and inquires if it could have affected her immunology. She is post-menopausal and had a colonoscopy the year before last, which was normal, with a recommendation to repeat in five years. Reports mild fatigue.   MEDICAL HISTORY Past Medical History:  Diagnosis Date   Abnormal Pap smear    Allergy    Dysplasia of cervix, low grade (CIN 1)    Essential hypertension 09/06/2022   H/O: pneumonia    Hypothyroidism    Rheumatoid arthritis (HCC)      SURGICAL HISTORY Past Surgical History:  Procedure Laterality Date   CESAREAN SECTION     LAPAROSCOPIC TUBAL LIGATION  02/01/2011   Procedure: LAPAROSCOPIC TUBAL LIGATION;  Surgeon: Jon CINDERELLA Rummer, MD;  Location: [facility];  Service: Gynecology;  Laterality: Bilateral;   LEEP     TUBAL LIGATION       SOCIAL HISTORY: She reports that she has never smoked. She has been exposed to tobacco smoke. She has never used smokeless tobacco. She reports that she does not drink alcohol and does not use drugs. Social History   Socioeconomic History   Marital status: Married    Spouse name: Not on file   Number of children: Not on file   Years of education: Not on file   Highest education level: Bachelor's degree (e.g., BA, AB, BS)  Occupational History   Not on file  Tobacco Use   Smoking status: Never    Passive exposure: Past   Smokeless tobacco: Never  Vaping Use    Vaping status: Never Used  Substance and Sexual Activity   Alcohol use: No   Drug use: No   Sexual activity: Yes    Birth control/protection: Surgical    Comment: BTL  Other Topics Concern   Not on file  Social History Narrative   Not on file   Social Drivers of Health   Financial Resource Strain: Low Risk  (02/27/2023)   Overall Financial Resource Strain (CARDIA)    Difficulty of Paying Living Expenses: Not hard at all  Food Insecurity: No Food Insecurity (12/05/2023)   Hunger Vital Sign    Worried About Running Out of Food in the Last Year: Never true    Ran Out of Food in the Last Year: Never true  Transportation Needs: No Transportation Needs (12/05/2023)   PRAPARE - Administrator, Civil Service (Medical): No    Lack of Transportation (Non-Medical): No  Physical Activity: Insufficiently Active (02/27/2023)   Exercise Vital Sign    Days of Exercise per Week: 3 days    Minutes of Exercise per Session: 20 min  Stress: No Stress Concern Present (02/27/2023)   [employer] of Occupational Health - Occupational Stress Questionnaire    Feeling of Stress : Not at all  Social  Connections: Socially Integrated (02/27/2023)   Social Connection and Isolation Panel    Frequency of Communication with Friends and Family: More than three times a week    Frequency of Social Gatherings with Friends and Family: Once a week    Attends Religious Services: More than 4 times per year    Active Member of [employer] or Organizations: Yes    Attends Engineer, structural: More than 4 times per year    Marital Status: Married  Catering manager Violence: Not At Risk (12/05/2023)   Humiliation, Afraid, Rape, and Kick questionnaire    Fear of Current or Ex-Partner: No    Emotionally Abused: No    Physically Abused: No    Sexually Abused: No    FAMILY HISTORY: Her family history includes Diabetes in her mother; Healthy in her daughter and son; Heart attack (age of onset: 41 -  7) in her father; Hypertension in her brother, father, and mother; Rheum arthritis in her maternal aunt, maternal aunt, and sister; Stroke in her mother.  CURRENT MEDICATIONS   Current Outpatient Medications  Medication Instructions   amLODipine  (NORVASC ) 2.5 mg, Oral, Daily   aspirin -acetaminophen -caffeine  (EXCEDRIN  MIGRAINE) 250-250-65 MG tablet As needed   azaTHIOprine  (IMURAN ) 75 mg, Oral, Daily   levothyroxine  (SYNTHROID ) 50 mcg, Oral, Daily   loratadine  (CLARITIN ) 10 MG tablet TAKE 1 TABLET BY MOUTH EVERY DAY   Multiple Vitamin (MULTIVITAMIN WITH MINERALS) TABS tablet 1 tablet, Daily   Probiotic Product (PROBIOTIC PO) Daily   Turmeric 1053 MG TABS Daily     ALLERGIES  She is allergic to other.  REVIEW OF SYSTEMS:  Review of Systems - Oncology   Rest of the pertinent review of systems is unremarkable except as mentioned above in HPI.  PHYSICAL EXAMINATION:    Onc Performance Status - 12/05/23 1304       ECOG Perf Status   ECOG Perf Status Fully active, able to carry on all pre-disease performance without restriction      KPS SCALE   KPS % SCORE Normal, no compliants, no evidence of disease          Vitals:   12/05/23 1300  BP: 136/83  Pulse: 65  Resp: 15  Temp: 98.3 F (36.8 C)  SpO2: 100%   Filed Weights   12/05/23 1300  Weight: 150 lb 14.4 oz (68.4 kg)    Physical Exam Constitutional:      General: She is not in acute distress.    Appearance: Normal appearance.  HENT:     Head: Normocephalic and atraumatic.  Cardiovascular:     Rate and Rhythm: Normal rate.  Pulmonary:     Effort: Pulmonary effort is normal. No respiratory distress.  Abdominal:     General: There is no distension.  Neurological:     General: No focal deficit present.     Mental Status: She is alert and oriented to person, place, and time.  Psychiatric:        Mood and Affect: Mood normal.        Behavior: Behavior normal.     LABORATORY DATA:   I have reviewed the  data as listed.  Results for orders placed or performed in visit on 12/05/23  Lactate dehydrogenase  Result Value Ref Range   LDH 202 (H) 98 - 192 U/L  CMP (Cancer Center only)  Result Value Ref Range   Sodium 141 135 - 145 mmol/L   Potassium 3.6 3.5 - 5.1 mmol/L   Chloride 104  98 - 111 mmol/L   CO2 26 22 - 32 mmol/L   Glucose, Bld 95 70 - 99 mg/dL   BUN 10 6 - 20 mg/dL   Creatinine 9.24 9.55 - 1.00 mg/dL   Calcium 89.3 (H) 8.9 - 10.3 mg/dL   Total Protein 8.8 (H) 6.5 - 8.1 g/dL   Albumin 4.7 3.5 - 5.0 g/dL   AST 42 (H) 15 - 41 U/L   ALT 19 0 - 44 U/L   Alkaline Phosphatase 50 38 - 126 U/L   Total Bilirubin 0.5 0.0 - 1.2 mg/dL   GFR, Estimated >39 >39 mL/min   Anion gap 11 5 - 15  CBC with Differential (Cancer Center Only)  Result Value Ref Range   WBC Count 3.7 (L) 4.0 - 10.5 K/uL   RBC 3.91 3.87 - 5.11 MIL/uL   Hemoglobin 11.2 (L) 12.0 - 15.0 g/dL   HCT 64.7 (L) 63.9 - 53.9 %   MCV 90.0 80.0 - 100.0 fL   MCH 28.6 26.0 - 34.0 pg   MCHC 31.8 30.0 - 36.0 g/dL   RDW 86.1 88.4 - 84.4 %   Platelet Count 291 150 - 400 K/uL   nRBC 0.0 0.0 - 0.2 %   Neutrophils Relative % 44 %   Neutro Abs 1.6 (L) 1.7 - 7.7 K/uL   Lymphocytes Relative 40 %   Lymphs Abs 1.5 0.7 - 4.0 K/uL   Monocytes Relative 10 %   Monocytes Absolute 0.4 0.1 - 1.0 K/uL   Eosinophils Relative 5 %   Eosinophils Absolute 0.2 0.0 - 0.5 K/uL   Basophils Relative 1 %   Basophils Absolute 0.0 0.0 - 0.1 K/uL   Immature Granulocytes 0 %   Abs Immature Granulocytes 0.01 0.00 - 0.07 K/uL     RADIOGRAPHIC STUDIES:  No pertinent imaging studies available to review.  Orders Placed This Encounter  Procedures   CBC with Differential (Cancer Center Only)    Standing Status:   Future    Number of Occurrences:   1    Expiration Date:   12/04/2024   CMP (Cancer Center only)    Standing Status:   Future    Number of Occurrences:   1    Expiration Date:   12/04/2024   Beta 2 microglobulin, serum    Standing Status:    Future    Number of Occurrences:   1    Expiration Date:   12/04/2024   Kappa/lambda light chains    Standing Status:   Future    Number of Occurrences:   1    Expiration Date:   12/04/2024   Lactate dehydrogenase    Standing Status:   Future    Number of Occurrences:   1    Expiration Date:   12/04/2024   Multiple Myeloma Panel (SPEP&IFE w/QIG)    Standing Status:   Future    Number of Occurrences:   1    Expiration Date:   12/04/2024    Future Appointments  Date Time Provider Department Center  12/06/2023  4:00 PM Theophilus Andrews, Tully GRADE, MD [facility] [address]  12/19/2023  2:30 PM Autumn Millman, MD [facility] None  04/10/2024  2:00 PM Dolphus Reiter, MD [facility] None    I spent a total of 40 minutes during this encounter with the patient including review of chart and various tests results, discussions about plan of care and coordination of care plan.  This document was completed utilizing speech recognition software. Grammatical  errors, random word insertions, pronoun errors, and incomplete sentences are an occasional consequence of this system due to software limitations, ambient noise, and hardware issues. Any formal questions or concerns about the content, text or information contained within the body of this dictation should be directly addressed to the provider for clarification.

## 2023-12-05 NOTE — Telephone Encounter (Signed)
Patient has appt 9/23

## 2023-12-06 ENCOUNTER — Ambulatory Visit (INDEPENDENT_AMBULATORY_CARE_PROVIDER_SITE_OTHER): Admitting: Internal Medicine

## 2023-12-06 ENCOUNTER — Encounter: Payer: Self-pay | Admitting: Internal Medicine

## 2023-12-06 VITALS — BP 110/80 | HR 76 | Temp 98.5°F | Wt 151.9 lb

## 2023-12-06 DIAGNOSIS — R519 Headache, unspecified: Secondary | ICD-10-CM

## 2023-12-06 LAB — KAPPA/LAMBDA LIGHT CHAINS
Kappa free light chain: 28.3 mg/L — ABNORMAL HIGH (ref 3.3–19.4)
Kappa, lambda light chain ratio: 1.13 (ref 0.26–1.65)
Lambda free light chains: 25.1 mg/L (ref 5.7–26.3)

## 2023-12-06 MED ORDER — KETOROLAC TROMETHAMINE 60 MG/2ML IM SOLN
60.0000 mg | Freq: Once | INTRAMUSCULAR | Status: AC
  Administered 2023-12-06: 60 mg via INTRAMUSCULAR

## 2023-12-06 MED ORDER — PROMETHAZINE HCL 25 MG PO TABS: 25.0000 mg | ORAL_TABLET | Freq: Once | ORAL | 0 refills | Status: DC

## 2023-12-06 NOTE — Progress Notes (Signed)
 Established Patient Office Visit     CC/Reason for Visit: Intractable headaches  HPI: Joanne  Dione French is a 48 y.o. female who is coming in today for the above mentioned reasons.  She has been experiencing a headache for the last 4 days.  She usually takes Excedrin  Migraine for her headaches and this relieves the pain.  She has taken this daily since then without much relief.  She describes a frontal and sometimes a posterior headache.  No blurry vision or double vision and in fact had an eye exam last month and does not need prescription glasses.  No chest pain or shortness of breath, no focal neurologic deficits.  She has normal blood pressure in office today.   Past Medical/Surgical History: Past Medical History:  Diagnosis Date   Abnormal Pap smear    Allergy    Dysplasia of cervix, low grade (CIN 1)    Essential hypertension 09/06/2022   H/O: pneumonia    Hypothyroidism    Rheumatoid arthritis (HCC)     Past Surgical History:  Procedure Laterality Date   CESAREAN SECTION     LAPAROSCOPIC TUBAL LIGATION  02/01/2011   Procedure: LAPAROSCOPIC TUBAL LIGATION;  Surgeon: Jon CINDERELLA Rummer, MD;  Location: [facility];  Service: Gynecology;  Laterality: Bilateral;   LEEP     TUBAL LIGATION      Social History:  reports that she has never smoked. She has been exposed to tobacco smoke. She has never used smokeless tobacco. She reports that she does not drink alcohol and does not use drugs.  Allergies: Allergies  Allergen Reactions   Other     Cats Fruit     Family History:  Family History  Problem Relation Age of Onset   Hypertension Mother    Diabetes Mother    Stroke Mother    Heart attack Father 60 - 81   Hypertension Father    Rheum arthritis Sister    Hypertension Brother    Healthy Daughter    Healthy Son    Rheum arthritis Maternal Aunt    Rheum arthritis Maternal Aunt      Current Outpatient Medications:    amLODipine  (NORVASC ) 2.5 MG tablet,  Take 1 tablet (2.5 mg total) by mouth daily., Disp: 90 tablet, Rfl: 3   aspirin -acetaminophen -caffeine  (EXCEDRIN  MIGRAINE) 250-250-65 MG tablet, Take by mouth as needed for headache., Disp: , Rfl:    azaTHIOprine  (IMURAN ) 50 MG tablet, Take 1.5 tablets (75 mg total) by mouth daily., Disp: 135 tablet, Rfl: 0   levothyroxine  (SYNTHROID ) 50 MCG tablet, TAKE 1 TABLET BY MOUTH EVERY DAY, Disp: 90 tablet, Rfl: 1   loratadine  (CLARITIN ) 10 MG tablet, TAKE 1 TABLET BY MOUTH EVERY DAY, Disp: 90 tablet, Rfl: 1   Multiple Vitamin (MULTIVITAMIN WITH MINERALS) TABS tablet, Take 1 tablet by mouth daily., Disp: , Rfl:    Probiotic Product (PROBIOTIC PO), Take by mouth daily., Disp: , Rfl:    promethazine  (PHENERGAN ) 25 MG tablet, Take 1 tablet (25 mg total) by mouth once for 1 dose., Disp: 1 tablet, Rfl: 0   Turmeric 1053 MG TABS, daily., Disp: , Rfl:   Current Facility-Administered Medications:    ketorolac  (TORADOL ) injection 60 mg, 60 mg, Intramuscular, Once,   Review of Systems:  Negative unless indicated in HPI.   Physical Exam: Vitals:   12/06/23 1600  BP: 110/80  Pulse: 76  Temp: 98.5 F (36.9 C)  TempSrc: Oral  SpO2: 99%  Weight: 151 lb 14.4 oz (  68.9 kg)    Body mass index is 28.7 kg/m.   Physical Exam Vitals reviewed.  Constitutional:      Appearance: Normal appearance.  HENT:     Head: Normocephalic and atraumatic.  Eyes:     General: Lids are normal.     Extraocular Movements: Extraocular movements intact.     Conjunctiva/sclera: Conjunctivae normal.     Pupils: Pupils are equal, round, and reactive to light.  Skin:    General: Skin is warm and dry.  Neurological:     General: No focal deficit present.     Mental Status: She is alert and oriented to person, place, and time.  Psychiatric:        Mood and Affect: Mood normal.        Behavior: Behavior normal.        Thought Content: Thought content normal.        Judgment: Judgment normal.      Impression and  Plan:  Acute intractable headache, unspecified headache type -     Ketorolac  Tromethamine  -     Promethazine  HCl; Take 1 tablet (25 mg total) by mouth once for 1 dose.  Dispense: 1 tablet; Refill: 0   - Will give 60 mg of IM Toradol  in office today.  Will also send in 1 tablet of Phenergan  for her to take tonight in addition to 25 mg of Benadryl.  Hopefully with prolonged rest and the NSAID she will feel better by tomorrow.  Time spent:31 minutes reviewing chart, interviewing and examining patient and formulating plan of care.     Tully Theophilus Andrews, MD [facility]

## 2023-12-07 LAB — BETA 2 MICROGLOBULIN, SERUM: Beta-2 Microglobulin: 1.9 mg/L (ref 0.6–2.4)

## 2023-12-08 LAB — MULTIPLE MYELOMA PANEL, SERUM
Albumin SerPl Elph-Mcnc: 3.8 g/dL (ref 2.9–4.4)
Albumin/Glob SerPl: 1 (ref 0.7–1.7)
Alpha 1: 0.2 g/dL (ref 0.0–0.4)
Alpha2 Glob SerPl Elph-Mcnc: 0.6 g/dL (ref 0.4–1.0)
B-Globulin SerPl Elph-Mcnc: 1.2 g/dL (ref 0.7–1.3)
Gamma Glob SerPl Elph-Mcnc: 2.3 g/dL — ABNORMAL HIGH (ref 0.4–1.8)
Globulin, Total: 4.2 g/dL — ABNORMAL HIGH (ref 2.2–3.9)
IgA: 418 mg/dL — ABNORMAL HIGH (ref 87–352)
IgG (Immunoglobin G), Serum: 2346 mg/dL — ABNORMAL HIGH (ref 586–1602)
IgM (Immunoglobulin M), Srm: 176 mg/dL (ref 26–217)
Total Protein ELP: 8 g/dL (ref 6.0–8.5)

## 2023-12-09 ENCOUNTER — Ambulatory Visit: Admitting: Family Medicine

## 2023-12-19 ENCOUNTER — Encounter: Payer: Self-pay | Admitting: Oncology

## 2023-12-19 ENCOUNTER — Inpatient Hospital Stay: Attending: Oncology | Admitting: Oncology

## 2023-12-19 DIAGNOSIS — D649 Anemia, unspecified: Secondary | ICD-10-CM | POA: Diagnosis not present

## 2023-12-19 DIAGNOSIS — R7689 Other specified abnormal immunological findings in serum: Secondary | ICD-10-CM | POA: Diagnosis not present

## 2023-12-19 NOTE — Progress Notes (Signed)
 [facility]  HEMATOLOGY-ONCOLOGY ELECTRONIC VISIT PROGRESS NOTE  PATIENT NAME: Joanne  Brendolyn French   MR#: [id number] DOB: [birth year]  DATE OF SERVICE: 12/19/2023  Patient Care Team: Mercer Clotilda SAUNDERS, MD as PCP - General (Family Medicine) Cheryl Waddell HERO, PA-C as Physician Assistant (Physician Assistant)  I connected with the patient via telephone conference and verified that I am speaking with the correct person using two identifiers. The patient's location is at home and I am providing care from the [facility].  I discussed the limitations, risks, security and privacy concerns of performing an evaluation and management service by e-visits and the availability of in person appointments. I also discussed with the patient that there may be a patient responsible charge related to this service. The patient expressed understanding and agreed to proceed.   ASSESSMENT & PLAN:   Joanne  Dione French is a 48 y.o. lady with a past medical history of seropositive rheumatoid arthritis, Sjogren syndrome, hypertension, hypothyroidism, dyslipidemia, interstitial lung disease, was referred to our service in September 2025 for evaluation of elevated IgA and IgG levels in the serum.  Workup negative for monoclonal gammopathy.  Reactionary elevation in immunoglobulins from her multiple inflammatory disorders.   High total serum IgG Elevated IgG at 2480 mg/dL and IgA at 559 mg/dL, with previous IgG elevation in 2020 at 1700 mg/dL.   Differential diagnosis includes inflammation due to rheumatoid arthritis and Sjogren's syndrome, or a potential bone marrow disorder such as monoclonal gammopathy.   The pattern of elevation in both IgG and IgA suggests inflammation rather than a monoclonal process. No signs of anemia, kidney dysfunction, or hypercalcemia to suggest multiple myeloma.   COVID-19 infection in July 2025 may have influenced immunoglobulin levels.  On her consultation with us  on  12/05/2023 labs showed chronic, mild anemia with hemoglobin of 11.2.  Calcium borderline at 10.6 but still within normal range.  Creatinine normal at 0.75.  For further evaluation, we checked SPEP, IFE, quantitative immunoglobulins, serum free light chains.  SPEP showed no M spike.  IFE showed polyclonal increase.  Quantitative IgG was still increased at 2346 mg/dL, IgA was increased at 581 mg/dL, IgM was normal.  Mildly increased serum free kappa to 28.3 mg/L, lambda normal at 25.1, ratio normal at 1.13.  Overall no evidence of monoclonal gammopathy was noted.    Her medical history includes Sjogren's syndrome, interstitial lung disease, and rheumatoid arthritis, which can contribute to inflammation and potentially elevated immunoglobulin levels.  Since no additional hematological workup or intervention is needed, she can be discharged from our office for continued follow-up with her PCP and rheumatology clinic.  She was provided reassurance.  Thank you for giving us  an opportunity to participate in her care.  Please reconsult us  as necessary    I discussed the assessment and treatment plan with the patient. The patient was provided an opportunity to ask questions and all were answered. The patient agreed with the plan and demonstrated an understanding of the instructions. The patient was advised to call back or seek an in-person evaluation if the symptoms worsen or if the condition fails to improve as anticipated.    I spent 12 minutes over the phone with the patient reviewing test results, discuss management and coordination/planning of care.  Chinita Patten, MD 12/19/2023 2:43 PM [facility] - A DEPT OF [facility] [address] [place] [address] Dept: [phone] Dept Fax: [phone]   INTERVAL HISTORY:  Please see above for problem oriented charting.  The purpose of today's discussion is to explain recent  lab results and to formulate plan of care.  Discussed the use of AI scribe software for clinical note transcription with the patient, who gave verbal consent to proceed.  History of Present Illness Joanne  Dione Schweers French French is a 48 year old female with Sjogren's syndrome and interstitial lung disease who presents with high IgG levels. She was referred by her rheumatology doctor for evaluation of high IgG levels.  She has been experiencing fluctuating high IgG levels, which have shown improvement compared to previous measurements taken in August.  Her medical history includes Sjogren's syndrome, interstitial lung disease, and rheumatoid arthritis, which can contribute to inflammation and potentially elevated immunoglobulin levels.  She consistently experiences mild anemia with a hemoglobin level of 11.2. Her platelet count remains stable, but her white blood cell count fluctuates.  Her current medications include Imuran , which is used to manage her conditions.    SUMMARY OF HEMATOLOGY HISTORY:  She was referred by her rheumatology doctor due to abnormal blood work indicating elevated immunoglobulin levels.   On 11/07/2023, labs at her rheumatologist office showed increased gammaglobulins on SPEP.  Quantitative IgG was increased at 2480 mg/dL and IgA was also increased at 443 mg/dL, IgM normal at 804.   She has a history of rheumatoid arthritis and Sjogren's syndrome, diagnosed at the end of 2019. Initially, she experienced swollen hands, which affected her ability to perform daily tasks such as buttoning clothes and opening bottles. She was initially treated with methotrexate and later switched to Imuran  in 2021, which has controlled her symptoms well.   She also has interstitial lung disease, for which she sees a pulmonologist. Her condition is stable with no new shortness of breath or concerning symptoms.   Recent blood work showed elevated levels of immunoglobulins IgG and IgA. In  August 2025, her IgG was 2480 mg/dL (normal up to 8349 mg/dL) and IgA was 559 mg/dL (normal up to 689 mg/dL). A previous test in 2020 showed slightly elevated IgG at 1700 mg/dL. She has mild anemia with a hemoglobin level of 11.6 g/dL (normal is 12 g/dL). She experienced headaches over the past weekend, which resolved with medication, and questions if they could be related to her anemia.   She had COVID-19 in mid-2025 and inquires if it could have affected her immunology. She is post-menopausal and had a colonoscopy the year before last, which was normal, with a recommendation to repeat in five years. Reports mild fatigue.  Elevated IgG at 2480 mg/dL and IgA at 559 mg/dL, with previous IgG elevation in 2020 at 1700 mg/dL.    Differential diagnosis includes inflammation due to rheumatoid arthritis and Sjogren's syndrome, or a potential bone marrow disorder such as monoclonal gammopathy.    The pattern of elevation in both IgG and IgA suggests inflammation rather than a monoclonal process. No signs of anemia, kidney dysfunction, or hypercalcemia to suggest multiple myeloma.    COVID-19 infection in July 2025 may have influenced immunoglobulin levels.   On her consultation with us  on 12/05/2023 labs showed chronic, mild anemia with hemoglobin of 11.2.  Calcium borderline at 10.6 but still within normal range.  Creatinine normal at 0.75.  For further evaluation, we checked SPEP, IFE, quantitative immunoglobulins, serum free light chains.  SPEP showed no M spike.  IFE showed polyclonal increase.  Quantitative IgG was still increased at 2346 mg/dL, IgA was increased at 581 mg/dL, IgM was  normal.  Mildly increased serum free kappa to 28.3 mg/L, lambda normal at 25.1, ratio normal at 1.13.  Overall no evidence of monoclonal gammopathy was noted.    Her medical history includes Sjogren's syndrome, interstitial lung disease, and rheumatoid arthritis, which can contribute to inflammation and potentially elevated  immunoglobulin levels.  REVIEW OF SYSTEMS:    Review of Systems - Oncology  All other pertinent systems were reviewed with the patient and are negative.  I have reviewed the past medical history, past surgical history, social history and family history with the patient and they are unchanged from previous note.  ALLERGIES:  She is allergic to other.  MEDICATIONS:  Current Outpatient Medications  Medication Sig Dispense Refill   amLODipine  (NORVASC ) 2.5 MG tablet Take 1 tablet (2.5 mg total) by mouth daily. 90 tablet 3   aspirin -acetaminophen -caffeine  (EXCEDRIN  MIGRAINE) 250-250-65 MG tablet Take by mouth as needed for headache.     azaTHIOprine  (IMURAN ) 50 MG tablet Take 1.5 tablets (75 mg total) by mouth daily. 135 tablet 0   levothyroxine  (SYNTHROID ) 50 MCG tablet TAKE 1 TABLET BY MOUTH EVERY DAY 90 tablet 1   loratadine  (CLARITIN ) 10 MG tablet TAKE 1 TABLET BY MOUTH EVERY DAY 90 tablet 1   Multiple Vitamin (MULTIVITAMIN WITH MINERALS) TABS tablet Take 1 tablet by mouth daily.     Probiotic Product (PROBIOTIC PO) Take by mouth daily.     promethazine  (PHENERGAN ) 25 MG tablet Take 1 tablet (25 mg total) by mouth once for 1 dose. 1 tablet 0   Turmeric 1053 MG TABS daily.     No current facility-administered medications for this visit.    PHYSICAL EXAMINATION:    Onc Performance Status - 12/19/23 1400       ECOG Perf Status   ECOG Perf Status Fully active, able to carry on all pre-disease performance without restriction      KPS SCALE   KPS % SCORE Normal, no compliants, no evidence of disease          LABORATORY DATA:   I have reviewed the data as listed.  Recent Results (from the past 2160 hours)  CBC with Differential/Platelet     Status: Abnormal   Collection Time: 11/07/23  1:31 PM  Result Value Ref Range   WBC 5.2 3.8 - 10.8 Thousand/uL   RBC 4.13 3.80 - 5.10 Million/uL   Hemoglobin 11.6 (L) 11.7 - 15.5 g/dL   HCT 63.0 64.9 - 54.9 %   MCV 89.3 80.0 - 100.0  fL   MCH 28.1 27.0 - 33.0 pg   MCHC 31.4 (L) 32.0 - 36.0 g/dL    Comment: For adults, a slight decrease in the calculated MCHC value (in the range of 30 to 32 g/dL) is most likely not clinically significant; however, it should be interpreted with caution in correlation with other red cell parameters and the patient's clinical condition.    RDW 13.4 11.0 - 15.0 %   Platelets 264 140 - 400 Thousand/uL   MPV 10.9 7.5 - 12.5 fL   Neutro Abs 3,266 1,500 - 7,800 cells/uL   Absolute Lymphocytes 1,399 850 - 3,900 cells/uL   Absolute Monocytes 354 200 - 950 cells/uL   Eosinophils Absolute 151 15 - 500 cells/uL   Basophils Absolute 31 0 - 200 cells/uL   Neutrophils Relative % 62.8 %   Total Lymphocyte 26.9 %   Monocytes Relative 6.8 %   Eosinophils Relative 2.9 %   Basophils Relative 0.6 %  Comprehensive metabolic  panel with GFR     Status: Abnormal   Collection Time: 11/07/23  1:31 PM  Result Value Ref Range   Glucose, Bld 87 65 - 99 mg/dL    Comment: .            Fasting reference interval .    BUN 14 7 - 25 mg/dL   Creat 9.35 9.49 - 9.00 mg/dL   eGFR 891 > OR = 60 fO/fpw/8.26f7   BUN/Creatinine Ratio SEE NOTE: 6 - 22 (calc)    Comment:    Not Reported: BUN and Creatinine are within    reference range. .    Sodium 139 135 - 146 mmol/L   Potassium 3.7 3.5 - 5.3 mmol/L   Chloride 103 98 - 110 mmol/L   CO2 29 20 - 32 mmol/L   Calcium 9.7 8.6 - 10.2 mg/dL   Total Protein 8.4 (H) 6.1 - 8.1 g/dL   Albumin 4.5 3.6 - 5.1 g/dL   Globulin 3.9 (H) 1.9 - 3.7 g/dL (calc)   AG Ratio 1.2 1.0 - 2.5 (calc)   Total Bilirubin 0.5 0.2 - 1.2 mg/dL   Alkaline phosphatase (APISO) 55 31 - 125 U/L   AST 30 10 - 35 U/L   ALT 10 6 - 29 U/L  Serum protein electrophoresis with reflex     Status: Abnormal   Collection Time: 11/07/23  1:31 PM  Result Value Ref Range   Total Protein 8.5 (H) 6.1 - 8.1 g/dL   Albumin ELP 4.4 3.8 - 4.8 g/dL   Alpha 1 0.3 0.2 - 0.3 g/dL   Alpha 2 0.7 0.5 - 0.9 g/dL    Beta Globulin 0.5 0.4 - 0.6 g/dL   Beta 2 0.5 0.2 - 0.5 g/dL   Gamma Globulin 2.1 (H) 0.8 - 1.7 g/dL   Abnormal Protein Band1  NONE DETECTED g/dL    Comment: See below   SPE Interp.      Comment: . Increase in gamma globulins is noted. Consider ordering  immunoglobulin quantification to confirm. .   Sedimentation rate     Status: Abnormal   Collection Time: 11/07/23  1:31 PM  Result Value Ref Range   Sed Rate 62 (H) 0 - 20 mm/h  C3 and C4     Status: None   Collection Time: 11/07/23  1:31 PM  Result Value Ref Range   C3 Complement 165 83 - 193 mg/dL   C4 Complement 26 15 - 57 mg/dL  Sjogrens syndrome-A extractable nuclear antibody     Status: Abnormal   Collection Time: 11/07/23  1:31 PM  Result Value Ref Range   SSA (Ro) (ENA) Antibody, IgG >8.0 POS (A) <1.0 NEG AI  ANA     Status: Abnormal   Collection Time: 11/07/23  1:31 PM  Result Value Ref Range   Anti Nuclear Antibody (ANA) POSITIVE (A) NEGATIVE    Comment: ANA IFA is a first line screen for detecting the presence of up to approximately 150 autoantibodies in various autoimmune diseases. A positive ANA IFA result is suggestive of autoimmune disease and reflexes to titer and pattern. Further laboratory testing may be considered if clinically indicated. . For additional information, please refer to http://education.[facility].com/faq/FAQ177 (This link is being provided for informational/ educational purposes only.) .   Rheumatoid factor     Status: Abnormal   Collection Time: 11/07/23  1:31 PM  Result Value Ref Range   Rheumatoid fact SerPl-aCnc 196 (H) <14 IU/mL    Comment: Verified by  repeat analysis. .   IFE Interpretation     Status: None   Collection Time: 11/07/23  1:31 PM  Result Value Ref Range   Immunofix Electr Int      Comment: . Normal pattern. No monoclonal proteins detected. SABRA Copping ab-titer (ANA titer)     Status: Abnormal   Collection Time: 11/07/23  1:31 PM  Result Value  Ref Range   ANA Titer 1 1:160 (H) titer    Comment:                 Reference Range                 <1:40        Negative                 1:40-1:80    Low Antibody Level                 >1:80        Elevated Antibody Level .    ANA Pattern 1 Cytoplasmic (A)     Comment: The presence of cytoplasmic fluorescence was noted on the HEp-2 slide. Other reactivities (e.g., anti- mitochondrial antibodies or anti-smooth muscle antibodies) may be responsible for this fluorescence. The clinical significance of this finding is uncertain. Clinical correlation is recommended. . AC-15 to AC-23: Cytoplasmic . International Consensus on ANA Patterns ([email or web address])    ANA TITER 1:1,280 (H) titer    Comment:                 Reference Range                 <1:40        Negative                 1:40-1:80    Low Antibody Level                 >1:80        Elevated Antibody Level .    ANA PATTERN Nuclear, Speckled (A)     Comment: Speckled pattern is associated with mixed connective tissue disease (MCTD), systemic lupus erythematosus (SLE), Sjogren's syndrome, dermatomyositis, and  systemic sclerosis/polymyositis overlap. . AC-2,4,5,29: Speckled . International Consensus on ANA Patterns ([email or web address])   IgG, IgA, IgM     Status: Abnormal   Collection Time: 11/07/23  1:31 PM  Result Value Ref Range   Immunoglobulin A 443 (H) 47 - 310 mg/dL   IgG (Immunoglobin G), Serum 2,480 (H) 600 - 1,640 mg/dL   IgM, Serum 804 50 - 699 mg/dL  TEST AUTHORIZATION     Status: None   Collection Time: 11/07/23  1:31 PM  Result Value Ref Range   TEST NAME: IMMUNOGLOBULINS    TEST CODE: [id number]    CLIENT CONTACT: ANDREA HATTON    REPORT ALWAYS MESSAGE SIGNATURE      Comment: . The laboratory testing on this patient was verbally requested or confirmed by the ordering physician or his or her authorized representative after contact with an employee of [employer]. Federal regulations require that we maintain on file written authorization for all laboratory testing.  Accordingly we are asking that the ordering physician or his or her authorized representative sign a copy of this report and promptly return it to the client service representative. . . Signature:____________________________________________________ . Please fax this signed page to [phone] or return it via your [employer] courier.   Multiple Myeloma Panel (SPEP&IFE  w/QIG)     Status: Abnormal   Collection Time: 12/05/23  1:28 PM  Result Value Ref Range   IgG (Immunoglobin G), Serum 2,346 (H) 586 - 1,602 mg/dL   IgA 581 (H) 87 - 647 mg/dL   IgM (Immunoglobulin M), Srm 176 26 - 217 mg/dL   Total Protein ELP 8.0 6.0 - 8.5 g/dL   Albumin SerPl Elph-Mcnc 3.8 2.9 - 4.4 g/dL   Alpha 1 0.2 0.0 - 0.4 g/dL   Alpha2 Glob SerPl Elph-Mcnc 0.6 0.4 - 1.0 g/dL   B-Globulin SerPl Elph-Mcnc 1.2 0.7 - 1.3 g/dL   Gamma Glob SerPl Elph-Mcnc 2.3 (H) 0.4 - 1.8 g/dL   M Protein SerPl Elph-Mcnc Not Observed Not Observed g/dL   Globulin, Total 4.2 (H) 2.2 - 3.9 g/dL   Albumin/Glob SerPl 1.0 0.7 - 1.7   IFE 1 Comment (A)     Comment: Polyclonal increase detected in one or more immunoglobulins.   Please Note Comment     Comment: (NOTE) Protein electrophoresis scan will follow via computer, mail, or courier delivery. Performed At: [facility] [address] [place], [place] [id number] Jennette Shorter MD Ey:[id number]   Lactate dehydrogenase     Status: Abnormal   Collection Time: 12/05/23  1:29 PM  Result Value Ref Range   LDH 202 (H) 98 - 192 U/L    Comment: Performed at [facility], [address], [place], [place] [address]  Kappa/lambda light chains     Status: Abnormal   Collection Time: 12/05/23  1:29 PM  Result Value Ref Range   Kappa free light chain 28.3 (H) 3.3 - 19.4 mg/L   Lambda free light chains 25.1 5.7 - 26.3 mg/L   Kappa,  lambda light chain ratio 1.13 0.26 - 1.65    Comment: (NOTE) Performed At: [facility] [address] [place], [place] [id number] Jennette Shorter MD Ey:[id number]   Beta 2 microglobulin, serum     Status: None   Collection Time: 12/05/23  1:29 PM  Result Value Ref Range   Beta-2  Microglobulin 1.9 0.6 - 2.4 mg/L    Comment: (NOTE) Siemens Immulite 2000 Immunochemiluminometric assay (ICMA) Values obtained with different assay methods or kits cannot be used interchangeably. Results cannot be interpreted as absolute evidence of the presence or absence of malignant disease. Performed At: [facility] [address] [place], [place] [id number] Jennette Shorter MD Ey:[id number]   CMP (Cancer Center only)     Status: Abnormal   Collection Time: 12/05/23  1:29 PM  Result Value Ref Range   Sodium 141 135 - 145 mmol/L   Potassium 3.6 3.5 - 5.1 mmol/L   Chloride 104 98 - 111 mmol/L   CO2 26 22 - 32 mmol/L   Glucose, Bld 95 70 - 99 mg/dL    Comment: Glucose reference range applies only to samples taken after fasting for at least 8 hours.   BUN 10 6 - 20 mg/dL   Creatinine 9.24 9.55 - 1.00 mg/dL   Calcium 89.3 (H) 8.9 - 10.3 mg/dL   Total Protein 8.8 (H) 6.5 - 8.1 g/dL   Albumin 4.7 3.5 - 5.0 g/dL   AST 42 (H) 15 - 41 U/L   ALT 19 0 - 44 U/L   Alkaline Phosphatase 50 38 - 126 U/L   Total Bilirubin 0.5 0.0 - 1.2 mg/dL   GFR, Estimated >39 >39 mL/min    Comment: (NOTE) Calculated using the CKD-EPI Creatinine Equation (2021)    Anion gap 11  5 - 15    Comment: Performed at [facility], [address] [place], [place] [address]  CBC with Differential (Cancer Center Only)     Status: Abnormal   Collection Time: 12/05/23  1:29 PM  Result Value Ref Range   WBC Count 3.7 (L) 4.0 - 10.5 K/uL   RBC 3.91 3.87 - 5.11 MIL/uL   Hemoglobin 11.2 (L) 12.0 - 15.0 g/dL   HCT 64.7 (L) 63.9 - 53.9 %   MCV 90.0 80.0 - 100.0 fL   MCH 28.6 26.0 - 34.0 pg   MCHC 31.8  30.0 - 36.0 g/dL   RDW 86.1 88.4 - 84.4 %   Platelet Count 291 150 - 400 K/uL   nRBC 0.0 0.0 - 0.2 %   Neutrophils Relative % 44 %   Neutro Abs 1.6 (L) 1.7 - 7.7 K/uL   Lymphocytes Relative 40 %   Lymphs Abs 1.5 0.7 - 4.0 K/uL   Monocytes Relative 10 %   Monocytes Absolute 0.4 0.1 - 1.0 K/uL   Eosinophils Relative 5 %   Eosinophils Absolute 0.2 0.0 - 0.5 K/uL   Basophils Relative 1 %   Basophils Absolute 0.0 0.0 - 0.1 K/uL   Immature Granulocytes 0 %   Abs Immature Granulocytes 0.01 0.00 - 0.07 K/uL    Comment: Performed at [facility], [address] [place], [place] [address]     RADIOGRAPHIC STUDIES:  No recent pertinent imaging studies available to review.  No orders of the defined types were placed in this encounter.    Future Appointments  Date Time Provider Department Center  04/10/2024  2:00 PM Dolphus Reiter, MD [facility] None    This document was completed utilizing speech recognition software. Grammatical errors, random word insertions, pronoun errors, and incomplete sentences are an occasional consequence of this system due to software limitations, ambient noise, and hardware issues. Any formal questions or concerns about the content, text or information contained within the body of this dictation should be directly addressed to the provider for clarification.

## 2023-12-19 NOTE — Assessment & Plan Note (Signed)
 Elevated IgG at 2480 mg/dL and IgA at 559 mg/dL, with previous IgG elevation in 2020 at 1700 mg/dL.   Differential diagnosis includes inflammation due to rheumatoid arthritis and Sjogren's syndrome, or a potential bone marrow disorder such as monoclonal gammopathy.   The pattern of elevation in both IgG and IgA suggests inflammation rather than a monoclonal process. No signs of anemia, kidney dysfunction, or hypercalcemia to suggest multiple myeloma.   COVID-19 infection in July 2025 may have influenced immunoglobulin levels.  On her consultation with us  on 12/05/2023 labs showed chronic, mild anemia with hemoglobin of 11.2.  Calcium borderline at 10.6 but still within normal range.  Creatinine normal at 0.75.  For further evaluation, we checked SPEP, IFE, quantitative immunoglobulins, serum free light chains.  SPEP showed no M spike.  IFE showed polyclonal increase.  Quantitative IgG was still increased at 2346 mg/dL, IgA was increased at 581 mg/dL, IgM was normal.  Mildly increased serum free kappa to 28.3 mg/L, lambda normal at 25.1, ratio normal at 1.13.  Overall no evidence of monoclonal gammopathy was noted.    Her medical history includes Sjogren's syndrome, interstitial lung disease, and rheumatoid arthritis, which can contribute to inflammation and potentially elevated immunoglobulin levels.  Since no additional hematological workup or intervention is needed, she can be discharged from our office for continued follow-up with her PCP and rheumatology clinic.  She was provided reassurance.  Thank you for giving us  an opportunity to participate in her care.  Please reconsult us  as necessary

## 2024-01-26 ENCOUNTER — Other Ambulatory Visit (HOSPITAL_BASED_OUTPATIENT_CLINIC_OR_DEPARTMENT_OTHER): Payer: Self-pay | Admitting: Family

## 2024-02-13 ENCOUNTER — Ambulatory Visit: Admitting: Family Medicine

## 2024-02-13 VITALS — BP 122/82 | HR 70 | Temp 98.7°F | Wt 149.4 lb

## 2024-02-13 DIAGNOSIS — R300 Dysuria: Secondary | ICD-10-CM | POA: Diagnosis not present

## 2024-02-13 DIAGNOSIS — N3001 Acute cystitis with hematuria: Secondary | ICD-10-CM

## 2024-02-13 LAB — POC URINALSYSI DIPSTICK (AUTOMATED)
Bilirubin, UA: NEGATIVE
Blood, UA: POSITIVE — AB
Glucose, UA: NEGATIVE
Ketones, UA: NEGATIVE
Nitrite, UA: NEGATIVE
Protein, UA: NEGATIVE
Spec Grav, UA: 1.015 (ref 1.010–1.025)
Urobilinogen, UA: 0.2 U/dL
pH, UA: 6.5 (ref 5.0–8.0)

## 2024-02-13 MED ORDER — AMOXICILLIN-POT CLAVULANATE 500-125 MG PO TABS: 1.0000 | ORAL_TABLET | Freq: Two times a day (BID) | ORAL | 0 refills | Status: AC

## 2024-02-13 NOTE — Progress Notes (Signed)
 Established Patient Office Visit   Subjective  Patient ID: [name]  Shara Hartis, female    DOB: Jan 11, 1976  Age: 48 y.o. MRN: [id number]  Chief Complaint  Patient presents with   Acute Visit    Patient came in due to pain at the end of urination starting Thursday.  Patient had lower back pain on Thursday and feels frequency and urgency with urination.    Patient is a 48 year old female seen for acute concern.  Patient endorses urinary frequency, urgency,suprapubic pain and dysuria at the end of urination.  Symptoms started 4 days ago.  At start of symptoms noticed mild b/l low back pain.  Was drinking 30-40 oz of water, but has since increased to 60 oz.  Had mild constipation, took stool softener.  Denies fever, chills, n/v.    Patient Active Problem List   Diagnosis Date Noted   High total serum IgG 12/05/2023   Normocytic anemia 12/05/2023   Positive ANA (antinuclear antibody) 06/07/2023   Essential hypertension 09/06/2022   Abnormal cervical Papanicolaou smear 07/08/2020   Cyst of ovary 07/08/2020   Encounter for therapeutic drug level monitoring 07/08/2020   Hypothyroidism 07/08/2020   Irregular periods 07/08/2020   Menopause 07/08/2020   Mixed hyperlipidemia 07/08/2020   Oligomenorrhea 07/08/2020   Pain in pelvis 07/08/2020   Stenosis of cervix 07/08/2020   Vaginal high risk human papillomavirus (HPV) DNA test positive 07/08/2020   Vitamin D deficiency 07/08/2020   ILD (interstitial lung disease) (HCC) 02/20/2019   Restrictive lung disease 10/09/2018   RA (rheumatoid arthritis) (HCC) 09/07/2018   H/O: pneumonia    Dysplasia of cervix, low grade (CIN 1 and 2)    Past Medical History:  Diagnosis Date   Abnormal Pap smear    Allergy    Dysplasia of cervix, low grade (CIN 1)    Essential hypertension 09/06/2022   H/O: pneumonia    Hypothyroidism    Rheumatoid arthritis (HCC)    Past Surgical History:  Procedure Laterality Date   CESAREAN SECTION     LAPAROSCOPIC  TUBAL LIGATION  02/01/2011   Procedure: LAPAROSCOPIC TUBAL LIGATION;  Surgeon: Jon CINDERELLA Rummer, MD;  Location: [facility];  Service: Gynecology;  Laterality: Bilateral;   LEEP     TUBAL LIGATION     Social History   Tobacco Use   Smoking status: Never    Passive exposure: Past   Smokeless tobacco: Never  Vaping Use   Vaping status: Never Used  Substance Use Topics   Alcohol use: No   Drug use: No   Family History  Problem Relation Age of Onset   Hypertension Mother    Diabetes Mother    Stroke Mother    Heart attack Father 81 - 68   Hypertension Father    Rheum arthritis Sister    Hypertension Brother    Healthy Daughter    Healthy Son    Rheum arthritis Maternal Aunt    Rheum arthritis Maternal Aunt    Allergies  Allergen Reactions   Other     Cats Fruit     ROS Negative unless stated above    Objective:     BP 122/82 (BP Location: Left Arm, Patient Position: Sitting, Cuff Size: Large)   Pulse 70   Temp 98.7 F (37.1 C) (Oral)   Wt 149 lb 6.4 oz (67.8 kg)   LMP 08/26/2013   SpO2 99%   BMI 28.23 kg/m  BP Readings from Last 3 Encounters:  02/13/24 122/82  12/06/23 110/80  12/05/23 136/83   Wt Readings from Last 3 Encounters:  02/13/24 149 lb 6.4 oz (67.8 kg)  12/06/23 151 lb 14.4 oz (68.9 kg)  12/05/23 150 lb 14.4 oz (68.4 kg)      Physical Exam Constitutional:      General: She is not in acute distress.    Appearance: Normal appearance.  HENT:     Head: Normocephalic and atraumatic.     Nose: Nose normal.     Mouth/Throat:     Mouth: Mucous membranes are moist.  Cardiovascular:     Rate and Rhythm: Normal rate and regular rhythm.     Heart sounds: Normal heart sounds. No murmur heard.    No gallop.  Pulmonary:     Effort: Pulmonary effort is normal. No respiratory distress.     Breath sounds: Normal breath sounds. No wheezing, rhonchi or rales.  Abdominal:     General: Bowel sounds are normal.     Palpations: Abdomen is soft.      Tenderness: There is abdominal tenderness in the suprapubic area. There is no guarding.  Skin:    General: Skin is warm and dry.  Neurological:     Mental Status: She is alert and oriented to person, place, and time.        02/13/2024    4:03 PM 12/05/2023    1:04 PM 12/05/2023    1:02 PM  Depression screen PHQ 2/9  Decreased Interest 0 0 0  Down, Depressed, Hopeless 0 0 0  PHQ - 2 Score 0 0 0  Altered sleeping 0    Tired, decreased energy 0    Change in appetite 0    Feeling bad or failure about yourself  0    Trouble concentrating 0    Moving slowly or fidgety/restless 0    Suicidal thoughts 0    PHQ-9 Score 0        02/28/2023    8:17 AM  GAD 7 : Generalized Anxiety Score  Nervous, Anxious, on Edge 0  Control/stop worrying 0  Trouble relaxing 0  Restless 0  Easily annoyed or irritable 0  Afraid - awful might happen 0  Anxiety Difficulty Not difficult at all     Results for orders placed or performed in visit on 02/13/24  POCT Urinalysis Dipstick (Automated)  Result Value Ref Range   Color, UA yellow    Clarity, UA clear    Glucose, UA Negative Negative   Bilirubin, UA neg    Ketones, UA neg    Spec Grav, UA 1.015 1.010 - 1.025   Blood, UA pos (A)    pH, UA 6.5 5.0 - 8.0   Protein, UA Negative Negative   Urobilinogen, UA 0.2 0.2 or 1.0 E.U./dL   Nitrite, UA neg    Leukocytes, UA Moderate (2+) (A) Negative      Assessment & Plan:   Acute cystitis with hematuria -     Amoxicillin -Pot Clavulanate; Take 1 tablet by mouth in the morning and at bedtime for 7 days.  Dispense: 14 tablet; Refill: 0 -     Urine Culture; Future  Dysuria -     POCT Urinalysis Dipstick (Automated) -     Amoxicillin -Pot Clavulanate; Take 1 tablet by mouth in the morning and at bedtime for 7 days.  Dispense: 14 tablet; Refill: 0 -     Urine Culture; Future   Acute urinary symptoms concerning for UTI.  POC UA with 2+ leuks and RBCs.  Obtain culture.  Start ABX.  Increase water and  fluid intake.  Given precautions.  Return if symptoms worsen or fail to improve.   Clotilda JONELLE Single, MD

## 2024-02-14 LAB — URINE CULTURE
MICRO NUMBER:: 17295571
Result:: NO GROWTH
SPECIMEN QUALITY:: ADEQUATE

## 2024-02-15 ENCOUNTER — Encounter: Payer: Self-pay | Admitting: Family Medicine

## 2024-02-17 ENCOUNTER — Ambulatory Visit: Payer: Self-pay | Admitting: Family Medicine

## 2024-02-20 ENCOUNTER — Other Ambulatory Visit: Payer: Self-pay

## 2024-02-22 ENCOUNTER — Other Ambulatory Visit (HOSPITAL_BASED_OUTPATIENT_CLINIC_OR_DEPARTMENT_OTHER): Payer: Self-pay | Admitting: Cardiovascular Disease

## 2024-02-23 MED ORDER — AMLODIPINE BESYLATE 2.5 MG PO TABS: 2.5000 mg | ORAL_TABLET | Freq: Every day | ORAL | 0 refills | Status: DC

## 2024-02-29 ENCOUNTER — Encounter: Payer: Self-pay | Admitting: Family Medicine

## 2024-02-29 ENCOUNTER — Ambulatory Visit: Admitting: Family Medicine

## 2024-02-29 VITALS — BP 122/84 | HR 87 | Temp 99.1°F | Ht 61.0 in | Wt 149.2 lb

## 2024-02-29 DIAGNOSIS — E782 Mixed hyperlipidemia: Secondary | ICD-10-CM | POA: Diagnosis not present

## 2024-02-29 DIAGNOSIS — Z0000 Encounter for general adult medical examination without abnormal findings: Secondary | ICD-10-CM

## 2024-02-29 DIAGNOSIS — M0579 Rheumatoid arthritis with rheumatoid factor of multiple sites without organ or systems involvement: Secondary | ICD-10-CM | POA: Diagnosis not present

## 2024-02-29 DIAGNOSIS — E039 Hypothyroidism, unspecified: Secondary | ICD-10-CM | POA: Diagnosis not present

## 2024-02-29 DIAGNOSIS — R311 Benign essential microscopic hematuria: Secondary | ICD-10-CM | POA: Diagnosis not present

## 2024-02-29 DIAGNOSIS — I10 Essential (primary) hypertension: Secondary | ICD-10-CM | POA: Diagnosis not present

## 2024-02-29 DIAGNOSIS — M3500 Sicca syndrome, unspecified: Secondary | ICD-10-CM | POA: Insufficient documentation

## 2024-02-29 DIAGNOSIS — D649 Anemia, unspecified: Secondary | ICD-10-CM | POA: Diagnosis not present

## 2024-02-29 LAB — CBC WITH DIFFERENTIAL/PLATELET
Basophils Absolute: 0 K/uL (ref 0.0–0.1)
Basophils Relative: 0.9 % (ref 0.0–3.0)
Eosinophils Absolute: 0.2 K/uL (ref 0.0–0.7)
Eosinophils Relative: 6.1 % — ABNORMAL HIGH (ref 0.0–5.0)
HCT: 34.8 % — ABNORMAL LOW (ref 36.0–46.0)
Hemoglobin: 11.5 g/dL — ABNORMAL LOW (ref 12.0–15.0)
Lymphocytes Relative: 42.2 % (ref 12.0–46.0)
Lymphs Abs: 1.4 K/uL (ref 0.7–4.0)
MCHC: 33 g/dL (ref 30.0–36.0)
MCV: 88.2 fl (ref 78.0–100.0)
Monocytes Absolute: 0.5 K/uL (ref 0.1–1.0)
Monocytes Relative: 14.7 % — ABNORMAL HIGH (ref 3.0–12.0)
Neutro Abs: 1.2 K/uL — ABNORMAL LOW (ref 1.4–7.7)
Neutrophils Relative %: 36.1 % — ABNORMAL LOW (ref 43.0–77.0)
Platelets: 283 K/uL (ref 150.0–400.0)
RBC: 3.94 Mil/uL (ref 3.87–5.11)
RDW: 14.3 % (ref 11.5–15.5)
WBC: 3.4 K/uL — ABNORMAL LOW (ref 4.0–10.5)

## 2024-02-29 LAB — LIPID PANEL
Cholesterol: 190 mg/dL (ref 28–200)
HDL: 68.9 mg/dL (ref >39.00)
LDL Cholesterol: 111 mg/dL — ABNORMAL HIGH (ref 10–99)
NonHDL: 120.88
Total CHOL/HDL Ratio: 3
Triglycerides: 47 mg/dL (ref 10.0–149.0)
VLDL: 9.4 mg/dL (ref 0.0–40.0)

## 2024-02-29 LAB — COMPREHENSIVE METABOLIC PANEL WITH GFR
ALT: 18 U/L (ref 3–35)
AST: 37 U/L (ref 5–37)
Albumin: 4.6 g/dL (ref 3.5–5.2)
Alkaline Phosphatase: 40 U/L (ref 39–117)
BUN: 16 mg/dL (ref 6–23)
CO2: 30 meq/L (ref 19–32)
Calcium: 9.9 mg/dL (ref 8.4–10.5)
Chloride: 102 meq/L (ref 96–112)
Creatinine, Ser: 0.77 mg/dL (ref 0.40–1.20)
GFR: 90.29 mL/min (ref >60.00)
Glucose, Bld: 87 mg/dL (ref 70–99)
Potassium: 3.5 meq/L (ref 3.5–5.1)
Sodium: 139 meq/L (ref 135–145)
Total Bilirubin: 0.4 mg/dL (ref 0.2–1.2)
Total Protein: 9 g/dL — ABNORMAL HIGH (ref 6.0–8.3)

## 2024-02-29 LAB — URINALYSIS, ROUTINE W REFLEX MICROSCOPIC
Bilirubin Urine: NEGATIVE
Hgb urine dipstick: NEGATIVE
Ketones, ur: NEGATIVE
Nitrite: NEGATIVE
Specific Gravity, Urine: 1.02 (ref 1.000–1.030)
Total Protein, Urine: NEGATIVE
Urine Glucose: NEGATIVE
Urobilinogen, UA: 0.2 (ref 0.0–1.0)
pH: 6 (ref 5.0–8.0)

## 2024-02-29 LAB — TSH: TSH: 2.65 u[IU]/mL (ref 0.35–5.50)

## 2024-02-29 LAB — HEMOGLOBIN A1C: Hgb A1c MFr Bld: 5.8 % (ref 4.6–6.5)

## 2024-02-29 NOTE — Progress Notes (Signed)
 Established Patient Office Visit   Subjective  Patient ID: [name], female    DOB: [birth year]  Age: 48 y.o. MRN: [id number]  Chief Complaint  Patient presents with   Annual Exam    Pt is a 48 yo female seen for CPE.  Pt doing well.  States still with blood in urine.  Noted a few wks ago on UA when pt thought she had a UTI.  Ucx was negative.  Noted again this wk at OB/Gyn appt.  Pt asymptomatic.  Inquires if kidney fxn recently checked at [facility] appt. denies back pain, pain that moves, abdominal pain, constipation, vaginal discharge, irritation, overt hematuria, or hematochezia.  Pt mentions occasional vibration sensation in head when laying down at night.  Started 2 months ago.  May happen twice a week.  Patient denies seasonal allergies.    Patient Active Problem List   Diagnosis Date Noted   Sjogren's disease 02/29/2024   High total serum IgG 12/05/2023   Normocytic anemia 12/05/2023   Positive ANA (antinuclear antibody) 06/07/2023   Essential hypertension 09/06/2022   Abnormal cervical Papanicolaou smear 07/08/2020   Cyst of ovary 07/08/2020   Encounter for therapeutic drug level monitoring 07/08/2020   Hypothyroidism 07/08/2020   Irregular periods 07/08/2020   Menopause 07/08/2020   Mixed hyperlipidemia 07/08/2020   Oligomenorrhea 07/08/2020   Pain in pelvis 07/08/2020   Stenosis of cervix 07/08/2020   Vaginal high risk human papillomavirus (HPV) DNA test positive 07/08/2020   Vitamin D deficiency 07/08/2020   ILD (interstitial lung disease) (HCC) 02/20/2019   Restrictive lung disease 10/09/2018   RA (rheumatoid arthritis) (HCC) 09/07/2018   H/O: pneumonia    Dysplasia of cervix, low grade (CIN 1 and 2)    Past Medical History:  Diagnosis Date   Abnormal Pap smear    Allergy    Dysplasia of cervix, low grade (CIN 1)    Essential hypertension 09/06/2022   H/O: pneumonia    Hypothyroidism    Rheumatoid arthritis (HCC)    Past Surgical History:   Procedure Laterality Date   CESAREAN SECTION     LAPAROSCOPIC TUBAL LIGATION  02/01/2011   Procedure: LAPAROSCOPIC TUBAL LIGATION;  Surgeon: Jon CINDERELLA Rummer, MD;  Location: [facility];  Service: Gynecology;  Laterality: Bilateral;   LEEP     TUBAL LIGATION     Social History[1] Family History  Problem Relation Age of Onset   Hypertension Mother    Diabetes Mother    Stroke Mother    Heart attack Father 78 - 66   Hypertension Father    Rheum arthritis Sister    Hypertension Brother    Healthy Daughter    Healthy Son    Rheum arthritis Maternal Aunt    Rheum arthritis Maternal Aunt    Allergies[2]  ROS Negative unless stated above    Objective:     BP 122/84 (BP Location: Left Arm, Patient Position: Sitting, Cuff Size: Normal)   Pulse 87   Temp 99.1 F (37.3 C) (Oral)   Ht 5' 1 (1.549 m)   Wt 149 lb 3.2 oz (67.7 kg)   LMP 08/26/2013   SpO2 99%   BMI 28.19 kg/m  BP Readings from Last 3 Encounters:  02/29/24 122/84  02/13/24 122/82  12/06/23 110/80   Wt Readings from Last 3 Encounters:  02/29/24 149 lb 3.2 oz (67.7 kg)  02/13/24 149 lb 6.4 oz (67.8 kg)  12/06/23 151 lb 14.4 oz (68.9 kg)  Physical Exam Constitutional:      Appearance: Normal appearance.  HENT:     Head: Normocephalic and atraumatic.     Right Ear: Tympanic membrane, ear canal and external ear normal.     Left Ear: Tympanic membrane, ear canal and external ear normal.     Nose: Nose normal.     Mouth/Throat:     Mouth: Mucous membranes are moist.     Pharynx: No oropharyngeal exudate or posterior oropharyngeal erythema.  Eyes:     General: No scleral icterus.    Extraocular Movements: Extraocular movements intact.     Conjunctiva/sclera: Conjunctivae normal.     Pupils: Pupils are equal, round, and reactive to light.  Neck:     Thyroid : No thyromegaly.     Vascular: No carotid bruit.  Cardiovascular:     Rate and Rhythm: Normal rate and regular rhythm.     Pulses: Normal pulses.      Heart sounds: Normal heart sounds. No murmur heard.    No friction rub.  Pulmonary:     Effort: Pulmonary effort is normal.     Breath sounds: Normal breath sounds. No wheezing, rhonchi or rales.  Abdominal:     General: Bowel sounds are normal.     Palpations: Abdomen is soft.     Tenderness: There is no abdominal tenderness.  Musculoskeletal:        General: No deformity. Normal range of motion.  Lymphadenopathy:     Cervical: No cervical adenopathy.  Skin:    General: Skin is warm and dry.     Findings: No lesion.  Neurological:     General: No focal deficit present.     Mental Status: She is alert and oriented to person, place, and time.  Psychiatric:        Mood and Affect: Mood normal.        Thought Content: Thought content normal.        02/29/2024    8:56 AM 02/13/2024    4:03 PM 12/05/2023    1:04 PM  Depression screen PHQ 2/9  Decreased Interest 0 0 0  Down, Depressed, Hopeless 0 0 0  PHQ - 2 Score 0 0 0  Altered sleeping 0 0   Tired, decreased energy 0 0   Change in appetite 0 0   Feeling bad or failure about yourself  0 0   Trouble concentrating 0 0   Moving slowly or fidgety/restless 0 0   Suicidal thoughts 0 0   PHQ-9 Score 0 0       02/29/2024    8:57 AM 02/28/2023    8:17 AM  GAD 7 : Generalized Anxiety Score  Nervous, Anxious, on Edge 0 0  Control/stop worrying 0 0  Worry too much - different things 0   Trouble relaxing 0 0  Restless 0 0  Easily annoyed or irritable 0 0  Afraid - awful might happen 0 0  Total GAD 7 Score 0   Anxiety Difficulty  Not difficult at all     No results found for any visits on 02/29/24.    Assessment & Plan:   Well adult exam -     CBC with Differential/Platelet; Future -     Comprehensive metabolic panel with GFR; Future -     Hemoglobin A1c; Future -     Lipid panel; Future -     TSH; Future  Essential hypertension -     Comprehensive metabolic panel with GFR; Future  Mixed hyperlipidemia -      Lipid panel; Future  Acquired hypothyroidism -     TSH; Future  Benign essential microscopic hematuria -     Urinalysis, Routine w reflex microscopic -     Ambulatory referral to Urology  Anemia, unspecified type -     Iron, TIBC and Ferritin Panel; Future  Rheumatoid arthritis involving multiple sites with positive rheumatoid factor (HCC)  Age-appropriate health screenings discussed.  Obtain labs.  Immunizations reviewed.  Influenza vaccine given 12/22/2023.  Mammogram done.  Colonoscopy done 10/08/2021. Pap 02/09/2022 with OB/GYN abnormal.  Obtain records to verify repeat done at recent [facility].  BP controlled.  Continue Norvasc  2.5 mg daily.  Continue lifestyle modifications.  Continue Imuran  and follow-up with rheumatology for RA.  Currently stable.  Continue levothyroxine  50 mcg daily for hypothyroidism.  Lifestyle modifications for cholesterol.  Start statin if needed.  Recheck CBC and iron as hemoglobin was 11.2 on labs a few months ago.   Return in about 6 months (around 08/29/2024) for Sooner if needed, chronic conditions.   [name] Single, MD     [1]  Social History Tobacco Use   Smoking status: Never    Passive exposure: Past   Smokeless tobacco: Never  Vaping Use   Vaping status: Never Used  Substance Use Topics   Alcohol use: Never   Drug use: Never  [2]  Allergies Allergen Reactions   Other     Cats Fruit

## 2024-03-01 LAB — IRON,TIBC AND FERRITIN PANEL
%SAT: 19 % (ref 16–45)
Ferritin: 71 ng/mL (ref 16–232)
Iron: 60 ug/dL (ref 40–190)
TIBC: 316 ug/dL (ref 250–450)

## 2024-03-02 ENCOUNTER — Ambulatory Visit: Payer: Self-pay | Admitting: Family Medicine

## 2024-03-09 ENCOUNTER — Other Ambulatory Visit: Payer: Self-pay

## 2024-03-09 ENCOUNTER — Other Ambulatory Visit: Payer: Self-pay | Admitting: Physician Assistant

## 2024-03-09 DIAGNOSIS — Z79899 Other long term (current) drug therapy: Secondary | ICD-10-CM

## 2024-03-09 LAB — CBC WITH DIFFERENTIAL/PLATELET
Absolute Lymphocytes: 1602 {cells}/uL (ref 850–3900)
Absolute Monocytes: 359 {cells}/uL (ref 200–950)
Basophils Absolute: 30 {cells}/uL (ref 0–200)
Basophils Relative: 0.8 %
Eosinophils Absolute: 241 {cells}/uL (ref 15–500)
Eosinophils Relative: 6.5 %
HCT: 33.2 % — ABNORMAL LOW (ref 35.9–46.0)
Hemoglobin: 10.6 g/dL — ABNORMAL LOW (ref 11.7–15.5)
MCH: 28.6 pg (ref 27.0–33.0)
MCHC: 31.9 g/dL (ref 31.6–35.4)
MCV: 89.7 fL (ref 81.4–101.7)
MPV: 10.9 fL (ref 7.5–12.5)
Monocytes Relative: 9.7 %
Neutro Abs: 1469 {cells}/uL — ABNORMAL LOW (ref 1500–7800)
Neutrophils Relative %: 39.7 %
Platelets: 292 Thousand/uL (ref 140–400)
RBC: 3.7 Million/uL — ABNORMAL LOW (ref 3.80–5.10)
RDW: 13.2 % (ref 11.0–15.0)
Total Lymphocyte: 43.3 %
WBC: 3.7 Thousand/uL — ABNORMAL LOW (ref 3.8–10.8)

## 2024-03-09 LAB — COMPREHENSIVE METABOLIC PANEL WITH GFR
AG Ratio: 1.1 (calc) (ref 1.0–2.5)
ALT: 15 U/L (ref 6–29)
AST: 37 U/L — ABNORMAL HIGH (ref 10–35)
Albumin: 4.3 g/dL (ref 3.6–5.1)
Alkaline phosphatase (APISO): 43 U/L (ref 31–125)
BUN: 12 mg/dL (ref 7–25)
CO2: 30 mmol/L (ref 20–32)
Calcium: 9.9 mg/dL (ref 8.6–10.2)
Chloride: 104 mmol/L (ref 98–110)
Creat: 0.59 mg/dL (ref 0.50–0.99)
Globulin: 3.8 g/dL — ABNORMAL HIGH (ref 1.9–3.7)
Glucose, Bld: 93 mg/dL (ref 65–99)
Potassium: 4.3 mmol/L (ref 3.5–5.3)
Sodium: 140 mmol/L (ref 135–146)
Total Bilirubin: 0.4 mg/dL (ref 0.2–1.2)
Total Protein: 8.1 g/dL (ref 6.1–8.1)
eGFR: 110 mL/min/1.73m2

## 2024-03-09 NOTE — Telephone Encounter (Signed)
 Last Fill: 11/07/2023  Labs: 02/29/2024 WBC 3.4 Hemoglobin 11.5 HCT 34.8 Neutrophils Relative 36.1 Monocytes 14.7 Eosinophils Relative 6.1 Neutro Abs 1.2 Total Protein 9.0  Next Visit: 04/10/2024  Last Visit: 11/07/2023  DX: Rheumatoid arthritis involving multiple sites with positive rheumatoid factor   Current Dose per office note 11/07/2023: Imuran  75 mg by mouth daily   Okay to refill Imuran ?

## 2024-03-11 ENCOUNTER — Ambulatory Visit: Payer: Self-pay | Admitting: Rheumatology

## 2024-03-11 NOTE — Progress Notes (Signed)
 Hemoglobin is low 10.6 white cell count is low and stable, liver function is mildly elevated.  Patient should avoid all NSAIDs and alcohol use.  Patient should discuss low hemoglobin with her hematologist.  Please forward results to her PCP and hematologist.

## 2024-03-21 ENCOUNTER — Telehealth: Payer: Self-pay

## 2024-03-21 DIAGNOSIS — Z79899 Other long term (current) drug therapy: Secondary | ICD-10-CM

## 2024-03-21 NOTE — Telephone Encounter (Signed)
 Patient advised Per Dr. Dolphus, Okay to repeat CBC in 1 month. Patient verbalized understanding. Will pend future order.

## 2024-03-21 NOTE — Telephone Encounter (Signed)
-----   Message from Maya Nash, MD sent at 03/21/2024 12:24 PM EST ----- Okay to repeat CBC in 1 month.  Please notify patient. SD ----- Message ----- From: Leigh Shelba SQUIBB, RT Sent: 03/20/2024   4:52 PM EST To: Maya Nash, MD  Per Dr. Autumn, the patient's Hematologist, patient should repeat CBC in 4 weeks based on lab results from 03/09/2024. Please advise. ----- Message ----- From: Autumn Millman, MD Sent: 03/20/2024  11:40 AM EST To: Clotilda JONELLE Single, MD; Shelba SQUIBB Leigh, RT  Reviewed her labs.  Though her hemoglobin is slightly lower compared to recent values, it is still closer to her baseline.  I would recommend repeating CBCD in approximately 4 weeks from the last lab check.  If persistent downward trend is noted, we can get involved again.  Previously we saw her for elevated IgG issues and since workup was unrevealing, we discharged her from our office at that time.  But I am happy to see her again as needed.  Please let me know if any further questions.  Thank you, Millman Autumn, MD ----- Message ----- From: Leigh Shelba SQUIBB, RT Sent: 03/12/2024  12:39 PM EST To: Clotilda JONELLE Single, MD; Millman Autumn, MD  Please review mutual patient's lab results. Thank you.

## 2024-03-27 NOTE — Progress Notes (Unsigned)
 "  Office Visit Note  Patient: Joanne  Susen French             Date of Birth: 1975/06/23           MRN: [id number]             PCP: Mercer Clotilda SAUNDERS, MD Referring: Mercer Clotilda SAUNDERS, MD Visit Date: 04/10/2024 Occupation: [facility] Care Guide  Subjective:  No chief complaint on file.   History of Present Illness: Joanne  Dione French is a 49 y.o. female ***     Activities of Daily Living:  Patient reports morning stiffness for *** {minute/hour:19697}.   Patient {ACTIONS;DENIES/REPORTS:21021675::Denies} nocturnal pain.  Difficulty dressing/grooming: {ACTIONS;DENIES/REPORTS:21021675::Denies} Difficulty climbing stairs: {ACTIONS;DENIES/REPORTS:21021675::Denies} Difficulty getting out of chair: {ACTIONS;DENIES/REPORTS:21021675::Denies} Difficulty using hands for taps, buttons, cutlery, and/or writing: {ACTIONS;DENIES/REPORTS:21021675::Denies}  No Rheumatology ROS completed.   PMFS History:  Patient Active Problem List   Diagnosis Date Noted   Sjogren's disease 02/29/2024   High total serum IgG 12/05/2023   Normocytic anemia 12/05/2023   Positive ANA (antinuclear antibody) 06/07/2023   Essential hypertension 09/06/2022   Abnormal cervical Papanicolaou smear 07/08/2020   Cyst of ovary 07/08/2020   Encounter for therapeutic drug level monitoring 07/08/2020   Hypothyroidism 07/08/2020   Irregular periods 07/08/2020   Menopause 07/08/2020   Mixed hyperlipidemia 07/08/2020   Oligomenorrhea 07/08/2020   Pain in pelvis 07/08/2020   Stenosis of cervix 07/08/2020   Vaginal high risk human papillomavirus (HPV) DNA test positive 07/08/2020   Vitamin D deficiency 07/08/2020   ILD (interstitial lung disease) (HCC) 02/20/2019   Restrictive lung disease 10/09/2018   RA (rheumatoid arthritis) (HCC) 09/07/2018   H/O: pneumonia    Dysplasia of cervix, low grade (CIN 1 and 2)     Past Medical History:  Diagnosis Date   Abnormal Pap smear    Allergy    Dysplasia of cervix, low  grade (CIN 1)    Essential hypertension 09/06/2022   H/O: pneumonia    Hypothyroidism    Rheumatoid arthritis (HCC)     Family History  Problem Relation Age of Onset   Hypertension Mother    Diabetes Mother    Stroke Mother    Heart attack Father 16 - 22   Hypertension Father    Rheum arthritis Sister    Hypertension Brother    Healthy Daughter    Healthy Son    Rheum arthritis Maternal Aunt    Rheum arthritis Maternal Aunt    Past Surgical History:  Procedure Laterality Date   CESAREAN SECTION     LAPAROSCOPIC TUBAL LIGATION  02/01/2011   Procedure: LAPAROSCOPIC TUBAL LIGATION;  Surgeon: Jon CINDERELLA Rummer, MD;  Location: [facility];  Service: Gynecology;  Laterality: Bilateral;   LEEP     TUBAL LIGATION     Social History[1] Social History   Social History Narrative   Not on file     Immunization History  Administered Date(s) Administered   Fluzone Influenza virus vaccine,trivalent (IIV3), split virus 12/19/2012, 03/05/2014, 11/24/2015, 11/28/2017, 12/28/2019, 12/31/2020   Influenza,inj,Quad PF,6+ Mos 12/29/2018   Influenza,inj,quad, With Preservative 01/10/2015   Influenza-Unspecified 12/29/2021, 12/28/2022, 12/22/2023   PFIZER(Purple Top)SARS-COV-2 Vaccination 05/25/2019, 06/15/2019, 02/01/2020   Td 08/14/2003, 07/02/2015   Td (Adult) 08/14/2003, 07/02/2015   Tdap 03/05/2014     Objective: Vital Signs: LMP 08/26/2013    Physical Exam   Musculoskeletal Exam: ***  CDAI Exam: CDAI Score: -- Patient Global: --; Provider Global: -- Swollen: --; Tender: -- Joint Exam 04/10/2024   No joint exam  has been documented for this visit   There is currently no information documented on the homunculus. Go to the Rheumatology activity and complete the homunculus joint exam.  Investigation: No additional findings.  Imaging: No results found.  Recent Labs: Lab Results  Component Value Date   WBC 3.7 (L) 03/09/2024   HGB 10.6 (L) 03/09/2024   PLT 292 03/09/2024    NA 140 03/09/2024   K 4.3 03/09/2024   CL 104 03/09/2024   CO2 30 03/09/2024   GLUCOSE 93 03/09/2024   BUN 12 03/09/2024   CREATININE 0.59 03/09/2024   BILITOT 0.4 03/09/2024   ALKPHOS 40 02/29/2024   AST 37 (H) 03/09/2024   ALT 15 03/09/2024   PROT 8.1 03/09/2024   ALBUMIN 4.6 02/29/2024   CALCIUM 9.9 03/09/2024   GFRAA 122 06/12/2020   QFTBGOLDPLUS NEGATIVE 03/07/2019    Speciality Comments: Treated with methotrexate (December 2019-February 2021)which was stopped due to shortness of breath. Imuran  and started in February 2021  Procedures:  No procedures performed Allergies: Other   Assessment / Plan:     Visit Diagnoses: No diagnosis found.  Orders: No orders of the defined types were placed in this encounter.  No orders of the defined types were placed in this encounter.   Face-to-face time spent with patient was *** minutes. Greater than 50% of time was spent in counseling and coordination of care.  Follow-Up Instructions: No follow-ups on file.   Daved JAYSON Gavel, CMA  Note - This record has been created using Animal nutritionist.  Chart creation errors have been sought, but may not always  have been located. Such creation errors do not reflect on  the standard of medical care.    [1]  Social History Tobacco Use   Smoking status: Never    Passive exposure: Past   Smokeless tobacco: Never  Vaping Use   Vaping status: Never Used  Substance Use Topics   Alcohol use: Never   Drug use: Never   "

## 2024-03-29 ENCOUNTER — Encounter (HOSPITAL_BASED_OUTPATIENT_CLINIC_OR_DEPARTMENT_OTHER): Payer: Self-pay | Admitting: Family

## 2024-03-29 ENCOUNTER — Other Ambulatory Visit (HOSPITAL_BASED_OUTPATIENT_CLINIC_OR_DEPARTMENT_OTHER): Payer: Self-pay

## 2024-03-29 ENCOUNTER — Ambulatory Visit (INDEPENDENT_AMBULATORY_CARE_PROVIDER_SITE_OTHER): Admitting: Family

## 2024-03-29 VITALS — BP 130/82 | HR 77 | Ht 61.0 in | Wt 145.6 lb

## 2024-03-29 DIAGNOSIS — I10 Essential (primary) hypertension: Secondary | ICD-10-CM | POA: Diagnosis not present

## 2024-03-29 MED ORDER — AMLODIPINE BESYLATE 2.5 MG PO TABS: 2.5000 mg | ORAL_TABLET | Freq: Every day | ORAL | 3 refills | Status: AC

## 2024-03-29 MED ORDER — COMIRNATY 30 MCG/0.3ML IM SUSY
0.3000 mL | PREFILLED_SYRINGE | Freq: Once | INTRAMUSCULAR | 0 refills | Status: AC
  Filled 2024-03-29: qty 0.3, 1d supply, fill #0

## 2024-03-29 NOTE — Patient Instructions (Signed)
 Medication Instructions:   Your physician recommends that you continue on your current medications as directed. Please refer to the Current Medication list given to you today.   *If you need a refill on your cardiac medications before your next appointment, please call your pharmacy*    Follow-Up:  AS NEEDED WITH CAITLIN WALKER, NP IN [facility]             [facility] [address] [place] [address] [place] [facility] [place], [place]  [address] [phone]

## 2024-03-29 NOTE — Progress Notes (Signed)
 "  [facility] Assessment:    Date:  03/29/2024   ID:  Joanne  Dione French, DOB 14-Nov-1975, MRN [id number]  PCP:  Mercer Clotilda SAUNDERS, MD  Cardiologist:  None  Nephrologist:  Referring MD: Mercer Clotilda SAUNDERS, MD   CC: Hypertension  History of Present Illness:    Joanne  Dione French is a 49 y.o. female with a hx of RA, hypothyroidism, HTN here to follow up in the [facility].   Joanne  Dione French was diagnosed with hypertension early 2020 (around age 57) in the setting of taking prednisone  however it did not return to normal after stopping prednisone .  At initial visit 04/2022 she was started on amlodipine  2.5 mg daily.  Coronary CTA 04/2022 calcium score of 0.  Echo ordered due to murmur noted by her OB/GYN performed 05/2022 normal LVEF 60 to 65%, no valvular abnormalities.  Renal duplex 05/2022 with no stenosis.  05/2022 normal renin-aldosterone, cortisol, catecholamines, metanephrines.  At visit 08/2022 she had stopped Amlodipine  but subsequently home BP readings elevated and it was resumed. At visit 12/16/22 BP controlled on Amlodipine  2.5mg  daily.  Presents today for follow-up. Reports no shortness of breath nor dyspnea on exertion. Reports no chest pain, pressure, or tightness. No edema, orthopnea, PND. Reports no palpitations.  Notes focusing on reducing sugar in her diet. She is exercising by walking around her home with weights.    The 10-year ASCVD risk score (Arnett DK, et al., 2019) is: 2.3%   Values used to calculate the score:     Age: 32 years     Clinically relevant sex: Female     Is Non-Hispanic African American: Yes     Diabetic: No     Tobacco smoker: No     Systolic Blood Pressure: 130 mmHg     Is BP treated: Yes     HDL Cholesterol: 68.9 mg/dL     Total Cholesterol: 190 mg/dL   Previous antihypertensives:   Past Medical History:  Diagnosis Date   Abnormal Pap smear    Allergy    Dysplasia of cervix, low grade (CIN 1)     Essential hypertension 09/06/2022   H/O: pneumonia    Hypothyroidism    Rheumatoid arthritis (HCC)     Past Surgical History:  Procedure Laterality Date   CESAREAN SECTION     LAPAROSCOPIC TUBAL LIGATION  02/01/2011   Procedure: LAPAROSCOPIC TUBAL LIGATION;  Surgeon: Jon CINDERELLA Rummer, MD;  Location: [facility];  Service: Gynecology;  Laterality: Bilateral;   LEEP     TUBAL LIGATION      Current Medications: Current Meds  Medication Sig   amLODipine  (NORVASC ) 2.5 MG tablet Take 1 tablet (2.5 mg total) by mouth daily.   aspirin -acetaminophen -caffeine  (EXCEDRIN  MIGRAINE) 250-250-65 MG tablet Take by mouth as needed for headache.   azaTHIOprine  (IMURAN ) 50 MG tablet TAKE 1 AND 1/2 TABLETS BY MOUTH DAILY   levothyroxine  (SYNTHROID ) 50 MCG tablet TAKE 1 TABLET BY MOUTH EVERY DAY   loratadine  (CLARITIN ) 10 MG tablet TAKE 1 TABLET BY MOUTH EVERY DAY   Multiple Vitamin (MULTIVITAMIN WITH MINERALS) TABS tablet Take 1 tablet by mouth daily.   Probiotic Product (PROBIOTIC PO) Take by mouth daily.   Turmeric 1053 MG TABS daily.     Allergies:   Cat dander and Other   Social History   Socioeconomic History   Marital status: Married    Spouse name: Not on file   Number of children: Not on file   Years of  education: Not on file   Highest education level: Bachelor's degree (e.g., BA, AB, BS)  Occupational History   Not on file  Tobacco Use   Smoking status: Never    Passive exposure: Past   Smokeless tobacco: Never  Vaping Use   Vaping status: Never Used  Substance and Sexual Activity   Alcohol use: Never   Drug use: Never   Sexual activity: Yes    Birth control/protection: Surgical    Comment: BTL  Other Topics Concern   Not on file  Social History Narrative   Not on file   Social Drivers of Health   Tobacco Use: Low Risk (03/29/2024)   Patient History    Smoking Tobacco Use: Never    Smokeless Tobacco Use: Never    Passive Exposure: Past  Financial Resource Strain: Low  Risk (02/13/2024)   Overall Financial Resource Strain (CARDIA)    Difficulty of Paying Living Expenses: Not hard at all  Food Insecurity: No Food Insecurity (02/13/2024)   Epic    Worried About Programme Researcher, Broadcasting/film/video in the Last Year: Never true    Ran Out of Food in the Last Year: Never true  Transportation Needs: No Transportation Needs (02/13/2024)   Epic    Lack of Transportation (Medical): No    Lack of Transportation (Non-Medical): No  Physical Activity: Insufficiently Active (02/13/2024)   Exercise Vital Sign    Days of Exercise per Week: 2 days    Minutes of Exercise per Session: 20 min  Stress: No Stress Concern Present (02/13/2024)   [employer] of Occupational Health - Occupational Stress Questionnaire    Feeling of Stress: Not at all  Social Connections: Socially Integrated (02/13/2024)   Social Connection and Isolation Panel    Frequency of Communication with Friends and Family: More than three times a week    Frequency of Social Gatherings with Friends and Family: Once a week    Attends Religious Services: More than 4 times per year    Active Member of Clubs or Organizations: Yes    Attends Banker Meetings: More than 4 times per year    Marital Status: Married  Depression (PHQ2-9): Low Risk (02/29/2024)   Depression (PHQ2-9)    PHQ-2 Score: 0  Alcohol Screen: Low Risk (02/27/2023)   Alcohol Screen    Last Alcohol Screening Score (AUDIT): 1  Housing: Unknown (02/13/2024)   Epic    Unable to Pay for Housing in the Last Year: No    Number of Times Moved in the Last Year: Not on file    Homeless in the Last Year: No  Utilities: Not At Risk (12/05/2023)   Epic    Threatened with loss of utilities: No  Health Literacy: Not on file    Family History: The patient's family history includes Diabetes in her mother; Healthy in her daughter and son; Heart attack (age of onset: 41 - 56) in her father; Hypertension in her brother, father, and mother; Rheum arthritis  in her maternal aunt, maternal aunt, and sister; Stroke in her mother.  ROS:   Please see the history of present illness.     All other systems reviewed and are negative.  EKGs/Labs/Other Studies Reviewed:         Recent Labs: 02/29/2024: TSH 2.65 03/09/2024: ALT 15; BUN 12; Creat 0.59; Hemoglobin 10.6; Platelets 292; Potassium 4.3; Sodium 140   Recent Lipid Panel    Component Value Date/Time   CHOL 190 02/29/2024 0848   TRIG  47.0 02/29/2024 0848   HDL 68.90 02/29/2024 0848   CHOLHDL 3 02/29/2024 0848   VLDL 9.4 02/29/2024 0848   LDLCALC 111 (H) 02/29/2024 0848    Physical Exam:   VS:  BP 130/82 (BP Location: Left Arm, Patient Position: Sitting, Cuff Size: Normal)   Pulse 77   Ht 5' 1 (1.549 m)   Wt 145 lb 9.6 oz (66 kg)   LMP 08/26/2013   SpO2 98%   BMI 27.51 kg/m  , BMI Body mass index is 27.51 kg/m. GENERAL:  Well appearing HEENT: Pupils equal round and reactive, fundi not visualized, oral mucosa unremarkable NECK:  No jugular venous distention, waveform within normal limits, carotid upstroke brisk and symmetric, no bruits, no thyromegaly LYMPHATICS:  No cervical adenopathy LUNGS:  Clear to auscultation bilaterally HEART:  RRR.  PMI not displaced or sustained,S1 and S2 within normal limits, no S3, no S4, no clicks, no rubs, no murmurs ABD:  Flat, positive bowel sounds normal in frequency in pitch, no bruits, no rebound, no guarding, no midline pulsatile mass, no hepatomegaly, no splenomegaly EXT:  2 plus pulses throughout, no edema, no cyanosis no clubbing SKIN:  No rashes no nodules NEURO:  Cranial nerves II through XII grossly intact, motor grossly intact throughout PSYCH:  Cognitively intact, oriented to person place and time   ASSESSMENT/PLAN:    HTN - BP is well controlled on amlodipine  2.5mg  daily. Monitoring at home periodically with reassuring readings. Refills provided. Secondary workup unremarkable, as below. Discussed to monitor BP at home at least 2  hours after medications and sitting for 5-10 minutes.   Murmur - previously noted by OB/GYN. Echo 04/2022 normal LVEF, no significant valvular abnormalities. No murmur appreciated on exam. No further workup needed.  Hypothyroidism - managed by PCP.   RA - Follows with rheumatology.   Screening for Secondary Hypertension:     05/13/2022    1:27 PM  Causes  Drugs/Herbals Screened     - Comments No excessive caffeine  nor OTC meds  Renovascular HTN Screened  Sleep Apnea N/A     - Comments no daytime somnolence nor sleep disordered breathing  Thyroid  Disease Screened     - Comments hypothyroidism managed by PCP  Hyperaldosteronism Screened  Pheochromocytoma Screened  Cushing's Syndrome Screened  Coarctation of the Aorta N/A     - Comments BP symmetrical  Compliance Screened     - Comments compliant with mediation regimen    Relevant Labs/Studies:    Latest Ref Rng & Units 03/09/2024    9:27 AM 02/29/2024    8:48 AM 12/05/2023    1:29 PM  Basic Labs  Sodium 135 - 146 mmol/L 140  139  141   Potassium 3.5 - 5.3 mmol/L 4.3  3.5  3.6   Creatinine 0.50 - 0.99 mg/dL 9.40  9.22  9.24        Latest Ref Rng & Units 02/29/2024    8:48 AM 02/28/2023    8:42 AM  Thyroid    TSH 0.35 - 5.50 uIU/mL 2.65  2.40        Latest Ref Rng & Units 05/26/2022    8:05 AM  Renin/Aldosterone   Aldosterone 0.0 - 30.0 ng/dL 7.0   Aldos/Renin Ratio 0.0 - 30.0 20.4        Latest Ref Rng & Units 05/26/2022    8:05 AM  Metanephrines/Catecholamines   Epinephrine 0 - 62 pg/mL 33   Norepinephrine 0 - 874 pg/mL 288   Dopamine 0 -  48 pg/mL <30   Metanephrines 0.0 - 88.0 pg/mL <25.0   Normetanephrines  0.0 - 218.9 pg/mL 45.6           06/04/2022    9:10 AM  Renovascular   Renal Artery US  Completed Yes      Disposition:    FU with [facility] PRN   Medication Adjustments/Labs and Tests Ordered: Current medicines are reviewed at length with the patient today.  Concerns  regarding medicines are outlined above.  Orders Placed This Encounter  Procedures   EKG 12-Lead   No orders of the defined types were placed in this encounter.    Signed, Reche GORMAN Finder, NP  03/29/2024 8:13 AM    [facility] "

## 2024-04-10 ENCOUNTER — Ambulatory Visit: Admitting: Rheumatology

## 2024-04-10 ENCOUNTER — Other Ambulatory Visit (HOSPITAL_BASED_OUTPATIENT_CLINIC_OR_DEPARTMENT_OTHER): Payer: Self-pay

## 2024-04-10 DIAGNOSIS — J849 Interstitial pulmonary disease, unspecified: Secondary | ICD-10-CM

## 2024-04-10 DIAGNOSIS — E049 Nontoxic goiter, unspecified: Secondary | ICD-10-CM

## 2024-04-10 DIAGNOSIS — R7689 Other specified abnormal immunological findings in serum: Secondary | ICD-10-CM

## 2024-04-10 DIAGNOSIS — Z8639 Personal history of other endocrine, nutritional and metabolic disease: Secondary | ICD-10-CM

## 2024-04-10 DIAGNOSIS — M3509 Sicca syndrome with other organ involvement: Secondary | ICD-10-CM

## 2024-04-10 DIAGNOSIS — N870 Mild cervical dysplasia: Secondary | ICD-10-CM

## 2024-04-10 DIAGNOSIS — I7300 Raynaud's syndrome without gangrene: Secondary | ICD-10-CM

## 2024-04-10 DIAGNOSIS — K219 Gastro-esophageal reflux disease without esophagitis: Secondary | ICD-10-CM

## 2024-04-10 DIAGNOSIS — Z79899 Other long term (current) drug therapy: Secondary | ICD-10-CM

## 2024-04-10 DIAGNOSIS — R7989 Other specified abnormal findings of blood chemistry: Secondary | ICD-10-CM

## 2024-04-10 DIAGNOSIS — M0579 Rheumatoid arthritis with rheumatoid factor of multiple sites without organ or systems involvement: Secondary | ICD-10-CM

## 2024-04-10 DIAGNOSIS — E559 Vitamin D deficiency, unspecified: Secondary | ICD-10-CM

## 2024-04-10 NOTE — Progress Notes (Unsigned)
 "  Office Visit Note  Patient: [name]  Tasneem Cormier             Date of Birth: [birth year]           MRN: [id number]             PCP: Mercer Clotilda SAUNDERS, MD Referring: Mercer Clotilda SAUNDERS, MD Visit Date: 04/11/2024 Occupation: [facility] Care Guide  Subjective:  No chief complaint on file.   History of Present Illness: [name]  Dione French is a 49 y.o. female ***     Activities of Daily Living:  Patient reports morning stiffness for *** {minute/hour:19697}.   Patient {ACTIONS;DENIES/REPORTS:21021675::Denies} nocturnal pain.  Difficulty dressing/grooming: {ACTIONS;DENIES/REPORTS:21021675::Denies} Difficulty climbing stairs: {ACTIONS;DENIES/REPORTS:21021675::Denies} Difficulty getting out of chair: {ACTIONS;DENIES/REPORTS:21021675::Denies} Difficulty using hands for taps, buttons, cutlery, and/or writing: {ACTIONS;DENIES/REPORTS:21021675::Denies}  No Rheumatology ROS completed.   PMFS History:  Patient Active Problem List   Diagnosis Date Noted   Sjogren's disease 02/29/2024   High total serum IgG 12/05/2023   Normocytic anemia 12/05/2023   Positive ANA (antinuclear antibody) 06/07/2023   Essential hypertension 09/06/2022   Abnormal cervical Papanicolaou smear 07/08/2020   Cyst of ovary 07/08/2020   Encounter for therapeutic drug level monitoring 07/08/2020   Hypothyroidism 07/08/2020   Irregular periods 07/08/2020   Menopause 07/08/2020   Mixed hyperlipidemia 07/08/2020   Oligomenorrhea 07/08/2020   Pain in pelvis 07/08/2020   Stenosis of cervix 07/08/2020   Vaginal high risk human papillomavirus (HPV) DNA test positive 07/08/2020   Vitamin D deficiency 07/08/2020   ILD (interstitial lung disease) (HCC) 02/20/2019   Restrictive lung disease 10/09/2018   RA (rheumatoid arthritis) (HCC) 09/07/2018   H/O: pneumonia    Dysplasia of cervix, low grade (CIN 1 and 2)     Past Medical History:  Diagnosis Date   Abnormal Pap smear    Allergy    Dysplasia of cervix, low  grade (CIN 1)    Essential hypertension 09/06/2022   H/O: pneumonia    Hypothyroidism    Rheumatoid arthritis (HCC)     Family History  Problem Relation Age of Onset   Hypertension Mother    Diabetes Mother    Stroke Mother    Heart attack Father 58 - 64   Hypertension Father    Rheum arthritis Sister    Hypertension Brother    Healthy Daughter    Healthy Son    Rheum arthritis Maternal Aunt    Rheum arthritis Maternal Aunt    Past Surgical History:  Procedure Laterality Date   CESAREAN SECTION     LAPAROSCOPIC TUBAL LIGATION  02/01/2011   Procedure: LAPAROSCOPIC TUBAL LIGATION;  Surgeon: Jon CINDERELLA Rummer, MD;  Location: [facility];  Service: Gynecology;  Laterality: Bilateral;   LEEP     TUBAL LIGATION     Social History[1] Social History   Social History Narrative   Not on file     Immunization History  Administered Date(s) Administered   Fluzone Influenza virus vaccine,trivalent (IIV3), split virus 12/19/2012, 03/05/2014, 11/24/2015, 11/28/2017, 12/28/2019, 12/31/2020   Influenza,inj,Quad PF,6+ Mos 12/29/2018   Influenza,inj,quad, With Preservative 01/10/2015   Influenza-Unspecified 12/29/2021, 12/28/2022, 12/22/2023   PFIZER(Purple Top)SARS-COV-2 Vaccination 05/25/2019, 06/15/2019, 02/01/2020   Td 08/14/2003, 07/02/2015   Td (Adult) 08/14/2003, 07/02/2015   Tdap 03/05/2014     Objective: Vital Signs: LMP 08/26/2013    Physical Exam   Musculoskeletal Exam: ***  CDAI Exam: CDAI Score: -- Patient Global: --; Provider Global: -- Swollen: --; Tender: -- Joint Exam 04/11/2024   No joint exam  has been documented for this visit   There is currently no information documented on the homunculus. Go to the Rheumatology activity and complete the homunculus joint exam.  Investigation: No additional findings.  Imaging: No results found.  Recent Labs: Lab Results  Component Value Date   WBC 3.7 (L) 03/09/2024   HGB 10.6 (L) 03/09/2024   PLT 292 03/09/2024    NA 140 03/09/2024   K 4.3 03/09/2024   CL 104 03/09/2024   CO2 30 03/09/2024   GLUCOSE 93 03/09/2024   BUN 12 03/09/2024   CREATININE 0.59 03/09/2024   BILITOT 0.4 03/09/2024   ALKPHOS 40 02/29/2024   AST 37 (H) 03/09/2024   ALT 15 03/09/2024   PROT 8.1 03/09/2024   ALBUMIN 4.6 02/29/2024   CALCIUM 9.9 03/09/2024   GFRAA 122 06/12/2020   QFTBGOLDPLUS NEGATIVE 03/07/2019    Speciality Comments: Treated with methotrexate (December 2019-February 2021)which was stopped due to shortness of breath. Imuran  and started in February 2021  Procedures:  No procedures performed Allergies: Cat dander and Other   Assessment / Plan:     Visit Diagnoses: No diagnosis found.  Orders: No orders of the defined types were placed in this encounter.  No orders of the defined types were placed in this encounter.   Face-to-face time spent with patient was *** minutes. Greater than 50% of time was spent in counseling and coordination of care.  Follow-Up Instructions: No follow-ups on file.   Maya Nash, MD  Note - This record has been created using Animal nutritionist.  Chart creation errors have been sought, but may not always  have been located. Such creation errors do not reflect on  the standard of medical care.    [1]  Social History Tobacco Use   Smoking status: Never    Passive exposure: Past   Smokeless tobacco: Never  Vaping Use   Vaping status: Never Used  Substance Use Topics   Alcohol use: Never   Drug use: Never   "

## 2024-04-11 ENCOUNTER — Encounter: Payer: Self-pay | Admitting: Rheumatology

## 2024-04-11 ENCOUNTER — Ambulatory Visit: Admitting: Rheumatology

## 2024-04-11 VITALS — BP 121/82 | HR 72 | Temp 98.5°F | Resp 15 | Ht 61.0 in | Wt 145.6 lb

## 2024-04-11 DIAGNOSIS — M0579 Rheumatoid arthritis with rheumatoid factor of multiple sites without organ or systems involvement: Secondary | ICD-10-CM

## 2024-04-11 DIAGNOSIS — J849 Interstitial pulmonary disease, unspecified: Secondary | ICD-10-CM

## 2024-04-11 DIAGNOSIS — I7300 Raynaud's syndrome without gangrene: Secondary | ICD-10-CM | POA: Diagnosis not present

## 2024-04-11 DIAGNOSIS — N870 Mild cervical dysplasia: Secondary | ICD-10-CM | POA: Diagnosis not present

## 2024-04-11 DIAGNOSIS — E049 Nontoxic goiter, unspecified: Secondary | ICD-10-CM

## 2024-04-11 DIAGNOSIS — M3509 Sicca syndrome with other organ involvement: Secondary | ICD-10-CM

## 2024-04-11 DIAGNOSIS — E559 Vitamin D deficiency, unspecified: Secondary | ICD-10-CM | POA: Diagnosis not present

## 2024-04-11 DIAGNOSIS — Z8639 Personal history of other endocrine, nutritional and metabolic disease: Secondary | ICD-10-CM | POA: Diagnosis not present

## 2024-04-11 DIAGNOSIS — Z79899 Other long term (current) drug therapy: Secondary | ICD-10-CM

## 2024-04-11 DIAGNOSIS — K219 Gastro-esophageal reflux disease without esophagitis: Secondary | ICD-10-CM

## 2024-04-11 LAB — CBC WITH DIFFERENTIAL/PLATELET
Absolute Lymphocytes: 1372 {cells}/uL (ref 850–3900)
Absolute Monocytes: 363 {cells}/uL (ref 200–950)
Basophils Absolute: 29 {cells}/uL (ref 0–200)
Basophils Relative: 0.6 %
Eosinophils Absolute: 201 {cells}/uL (ref 15–500)
Eosinophils Relative: 4.1 %
HCT: 34.7 % — ABNORMAL LOW (ref 35.9–46.0)
Hemoglobin: 11.3 g/dL — ABNORMAL LOW (ref 11.7–15.5)
MCH: 28.6 pg (ref 27.0–33.0)
MCHC: 32.6 g/dL (ref 31.6–35.4)
MCV: 87.8 fL (ref 81.4–101.7)
MPV: 11 fL (ref 7.5–12.5)
Monocytes Relative: 7.4 %
Neutro Abs: 2935 {cells}/uL (ref 1500–7800)
Neutrophils Relative %: 59.9 %
Platelets: 294 10*3/uL (ref 140–400)
RBC: 3.95 Million/uL (ref 3.80–5.10)
RDW: 13.1 % (ref 11.0–15.0)
Total Lymphocyte: 28 %
WBC: 4.9 10*3/uL (ref 3.8–10.8)

## 2024-04-11 NOTE — Patient Instructions (Signed)
 Standing Labs We placed an order today for your standing lab work.   Please have your standing labs drawn in April and every 3 months  Please have your labs drawn 2 weeks prior to your appointment so that the provider can discuss your lab results at your appointment, if possible.  Please note that you may see your imaging and lab results in MyChart before we have reviewed them. We will contact you once all results are reviewed. Please allow our office up to 72 hours to thoroughly review all of the results before contacting the office for clarification of your results.  WALK-IN LAB HOURS  Monday through Thursday from 8:00 am - 4:30 pm and Friday from 8:00 am-12:00 pm.  Patients with office visits requiring labs will be seen before walk-in labs.  You may encounter longer than normal wait times. Please allow additional time. Wait times may be shorter on  Monday and Thursday afternoons.  We do not book appointments for walk-in labs. We appreciate your patience and understanding with our staff.   Labs are drawn by [facility]. Please bring your co-pay at the time of your lab draw.  You may receive a bill from [facility] for your lab work.  Please note if you are on Hydroxychloroquine and and an order has been placed for a Hydroxychloroquine level,  you will need to have it drawn 4 hours or more after your last dose.  If you wish to have your labs drawn at another location, please call the office 24 hours in advance so we can fax the orders.  The office is located at [address], [place], [place] [address]   If you have any questions regarding directions or hours of operation,  please call [phone].   As a reminder, please drink plenty of water prior to coming for your lab work. Thanks!   Vaccines You are taking a medication(s) that can suppress your immune system.  The following immunizations are recommended: Flu annually Covid-19  Td/Tdap (tetanus, diphtheria, pertussis) every  10 years Pneumonia (Prevnar 15 then Pneumovax 23 at least 1 year apart.  Alternatively, can take Prevnar 20 without needing additional dose) Shingrix: 2 doses from 4 weeks to 6 months apart  Please check with your PCP to make sure you are up to date.   If you have signs or symptoms of an infection or start antibiotics: First, call your PCP for workup of your infection. Hold your medication through the infection, until you complete your antibiotics, and until symptoms resolve if you take the following: Injectable medication (Actemra, Benlysta, Cimzia, Cosentyx, Enbrel, Humira, Kevzara, Orencia, Remicade, Simponi, Stelara, Taltz, Tremfya) Methotrexate Leflunomide (Arava) Mycophenolate (Cellcept) Earma, Olumiant, or Rinvoq

## 2024-04-12 ENCOUNTER — Ambulatory Visit: Payer: Self-pay | Admitting: Physician Assistant

## 2024-05-10 ENCOUNTER — Encounter (HOSPITAL_BASED_OUTPATIENT_CLINIC_OR_DEPARTMENT_OTHER): Admitting: Family

## 2024-08-17 ENCOUNTER — Ambulatory Visit: Admitting: Internal Medicine

## 2024-09-10 ENCOUNTER — Ambulatory Visit: Admitting: Physician Assistant

## 2025-03-01 ENCOUNTER — Encounter: Admitting: Family Medicine
